# Patient Record
Sex: Male | Born: 1945 | Race: Black or African American | Hispanic: No | Marital: Single | Smoking: Never smoker
Health system: Southern US, Community
[De-identification: ages and names within clinical notes are randomized; demographics above are authoritative.]

## PROBLEM LIST (undated history)

## (undated) DIAGNOSIS — E870 Hyperosmolality and hypernatremia: Secondary | ICD-10-CM

## (undated) DIAGNOSIS — R5383 Other fatigue: Secondary | ICD-10-CM

## (undated) DIAGNOSIS — K449 Diaphragmatic hernia without obstruction or gangrene: Secondary | ICD-10-CM

## (undated) DIAGNOSIS — K219 Gastro-esophageal reflux disease without esophagitis: Secondary | ICD-10-CM

## (undated) DIAGNOSIS — I8390 Asymptomatic varicose veins of unspecified lower extremity: Secondary | ICD-10-CM

## (undated) DIAGNOSIS — N289 Disorder of kidney and ureter, unspecified: Secondary | ICD-10-CM

## (undated) DIAGNOSIS — M109 Gout, unspecified: Secondary | ICD-10-CM

## (undated) DIAGNOSIS — G609 Hereditary and idiopathic neuropathy, unspecified: Secondary | ICD-10-CM

## (undated) DIAGNOSIS — M1710 Unilateral primary osteoarthritis, unspecified knee: Secondary | ICD-10-CM

## (undated) DIAGNOSIS — N189 Chronic kidney disease, unspecified: Secondary | ICD-10-CM

## (undated) DIAGNOSIS — M1991 Primary osteoarthritis, unspecified site: Secondary | ICD-10-CM

## (undated) DIAGNOSIS — IMO0002 Reserved for concepts with insufficient information to code with codable children: Secondary | ICD-10-CM

## (undated) DIAGNOSIS — E119 Type 2 diabetes mellitus without complications: Secondary | ICD-10-CM

## (undated) DIAGNOSIS — D472 Monoclonal gammopathy: Secondary | ICD-10-CM

## (undated) DIAGNOSIS — E785 Hyperlipidemia, unspecified: Secondary | ICD-10-CM

## (undated) DIAGNOSIS — R5381 Other malaise: Secondary | ICD-10-CM

## (undated) DIAGNOSIS — I10 Essential (primary) hypertension: Secondary | ICD-10-CM

## (undated) DIAGNOSIS — G4452 New daily persistent headache (NDPH): Secondary | ICD-10-CM

## (undated) HISTORY — DX: New daily persistent headache (ndph): G44.52

## (undated) HISTORY — DX: Monoclonal gammopathy: D47.2

## (undated) HISTORY — DX: Gastro-esophageal reflux disease without esophagitis: K21.9

## (undated) HISTORY — DX: Diaphragmatic hernia without obstruction or gangrene: K44.9

## (undated) HISTORY — DX: Unilateral primary osteoarthritis, unspecified knee: M17.10

## (undated) HISTORY — DX: Hyperosmolality and hypernatremia: E87.0

## (undated) HISTORY — DX: Hereditary and idiopathic neuropathy, unspecified: G60.9

## (undated) HISTORY — DX: Type 2 diabetes mellitus without complications: E11.9

## (undated) HISTORY — DX: Gout, unspecified: M10.9

## (undated) HISTORY — DX: Other malaise: R53.83

## (undated) HISTORY — DX: Other malaise: R53.81

## (undated) HISTORY — DX: Reserved for concepts with insufficient information to code with codable children: IMO0002

## (undated) HISTORY — DX: Disorder of kidney and ureter, unspecified: N28.9

## (undated) HISTORY — DX: Asymptomatic varicose veins of unspecified lower extremity: I83.90

## (undated) HISTORY — DX: Primary osteoarthritis, unspecified site: M19.91

## (undated) HISTORY — DX: Hypercalcemia: E83.52

## (undated) HISTORY — DX: Hyperlipidemia, unspecified: E78.5

## (undated) HISTORY — DX: Chronic kidney disease, unspecified: N18.9

---

## 1998-03-19 ENCOUNTER — Other Ambulatory Visit: Admission: RE | Admit: 1998-03-19 | Discharge: 1998-03-19 | Payer: Self-pay | Admitting: *Deleted

## 1998-08-23 ENCOUNTER — Ambulatory Visit (HOSPITAL_COMMUNITY): Admission: RE | Admit: 1998-08-23 | Discharge: 1998-08-23 | Payer: Self-pay | Admitting: Nephrology

## 1998-08-23 ENCOUNTER — Encounter: Payer: Self-pay | Admitting: Nephrology

## 1999-03-22 ENCOUNTER — Encounter: Admission: RE | Admit: 1999-03-22 | Discharge: 1999-06-20 | Payer: Self-pay | Admitting: *Deleted

## 2001-11-24 ENCOUNTER — Ambulatory Visit (HOSPITAL_COMMUNITY): Admission: RE | Admit: 2001-11-24 | Discharge: 2001-11-24 | Payer: Self-pay | Admitting: Gastroenterology

## 2002-12-12 ENCOUNTER — Encounter: Admission: RE | Admit: 2002-12-12 | Discharge: 2002-12-12 | Payer: Self-pay | Admitting: Internal Medicine

## 2002-12-12 ENCOUNTER — Encounter: Payer: Self-pay | Admitting: Internal Medicine

## 2003-01-05 ENCOUNTER — Encounter (INDEPENDENT_AMBULATORY_CARE_PROVIDER_SITE_OTHER): Payer: Self-pay | Admitting: Specialist

## 2003-01-05 ENCOUNTER — Ambulatory Visit (HOSPITAL_COMMUNITY): Admission: RE | Admit: 2003-01-05 | Discharge: 2003-01-05 | Payer: Self-pay | Admitting: Gastroenterology

## 2004-08-22 ENCOUNTER — Encounter: Admission: RE | Admit: 2004-08-22 | Discharge: 2004-08-22 | Payer: Self-pay | Admitting: Internal Medicine

## 2004-09-19 ENCOUNTER — Encounter: Admission: RE | Admit: 2004-09-19 | Discharge: 2004-09-19 | Payer: Self-pay | Admitting: Internal Medicine

## 2004-09-27 ENCOUNTER — Encounter: Admission: RE | Admit: 2004-09-27 | Discharge: 2004-09-27 | Payer: Self-pay | Admitting: Internal Medicine

## 2004-10-24 ENCOUNTER — Encounter: Admission: RE | Admit: 2004-10-24 | Discharge: 2004-10-24 | Payer: Self-pay | Admitting: Internal Medicine

## 2005-03-06 ENCOUNTER — Encounter: Admission: RE | Admit: 2005-03-06 | Discharge: 2005-03-06 | Payer: Self-pay | Admitting: Interventional Radiology

## 2005-05-13 ENCOUNTER — Encounter: Admission: RE | Admit: 2005-05-13 | Discharge: 2005-05-13 | Payer: Self-pay | Admitting: Internal Medicine

## 2005-10-13 HISTORY — PX: CARPAL TUNNEL RELEASE: SHX101

## 2005-10-22 ENCOUNTER — Ambulatory Visit (HOSPITAL_BASED_OUTPATIENT_CLINIC_OR_DEPARTMENT_OTHER): Admission: RE | Admit: 2005-10-22 | Discharge: 2005-10-22 | Payer: Self-pay | Admitting: Orthopedic Surgery

## 2006-01-01 ENCOUNTER — Ambulatory Visit (HOSPITAL_BASED_OUTPATIENT_CLINIC_OR_DEPARTMENT_OTHER): Admission: RE | Admit: 2006-01-01 | Discharge: 2006-01-01 | Payer: Self-pay | Admitting: Orthopedic Surgery

## 2009-08-14 ENCOUNTER — Ambulatory Visit: Payer: Self-pay | Admitting: Oncology

## 2009-08-14 ENCOUNTER — Encounter: Admission: RE | Admit: 2009-08-14 | Discharge: 2009-08-14 | Payer: Self-pay | Admitting: Nephrology

## 2009-08-20 LAB — CBC WITH DIFFERENTIAL/PLATELET
BASO%: 0.1 % (ref 0.0–2.0)
EOS%: 0.4 % (ref 0.0–7.0)
MCH: 26.6 pg — ABNORMAL LOW (ref 27.2–33.4)
MCHC: 32.9 g/dL (ref 32.0–36.0)
MCV: 81 fL (ref 79.3–98.0)
MONO%: 3.9 % (ref 0.0–14.0)
NEUT%: 57.3 % (ref 39.0–75.0)
RDW: 15.6 % — ABNORMAL HIGH (ref 11.0–14.6)
lymph#: 3.8 10*3/uL — ABNORMAL HIGH (ref 0.9–3.3)

## 2009-08-20 LAB — CHCC SMEAR

## 2009-08-22 LAB — CREATININE CLEARANCE, URINE, 24 HOUR
Creatinine Clearance: 69 mL/min — ABNORMAL LOW (ref 75–125)
Creatinine, 24H Ur: 1847 mg/d (ref 800–2000)
Creatinine, Urine: 96 mg/dL
Creatinine: 1.85 mg/dL — ABNORMAL HIGH (ref 0.40–1.50)
Urine Total Volume-CRCL: 1925 mL

## 2009-08-22 LAB — IMMUNOFIXATION ELECTROPHORESIS
IgA: 87 mg/dL (ref 68–378)
IgG (Immunoglobin G), Serum: 1100 mg/dL (ref 694–1618)
Total Protein, Serum Electrophoresis: 7.6 g/dL (ref 6.0–8.3)

## 2009-08-22 LAB — COMPREHENSIVE METABOLIC PANEL
AST: 12 U/L (ref 0–37)
Albumin: 4.7 g/dL (ref 3.5–5.2)
Alkaline Phosphatase: 67 U/L (ref 39–117)
BUN: 38 mg/dL — ABNORMAL HIGH (ref 6–23)
Potassium: 3.7 mEq/L (ref 3.5–5.3)
Total Bilirubin: 0.3 mg/dL (ref 0.3–1.2)

## 2009-12-03 ENCOUNTER — Ambulatory Visit: Payer: Self-pay | Admitting: Oncology

## 2009-12-05 LAB — CBC WITH DIFFERENTIAL/PLATELET
BASO%: 0.5 % (ref 0.0–2.0)
Basophils Absolute: 0 10*3/uL (ref 0.0–0.1)
EOS%: 0.8 % (ref 0.0–7.0)
HGB: 12.2 g/dL — ABNORMAL LOW (ref 13.0–17.1)
MCH: 27.8 pg (ref 27.2–33.4)
MCHC: 32.9 g/dL (ref 32.0–36.0)
MCV: 84.5 fL (ref 79.3–98.0)
MONO%: 3.9 % (ref 0.0–14.0)
RBC: 4.38 10*6/uL (ref 4.20–5.82)
RDW: 16.7 % — ABNORMAL HIGH (ref 11.0–14.6)
lymph#: 3.6 10*3/uL — ABNORMAL HIGH (ref 0.9–3.3)

## 2009-12-05 LAB — PROTEIN / CREATININE RATIO, URINE: Creatinine, Urine: 230.4 mg/dL

## 2009-12-07 LAB — COMPREHENSIVE METABOLIC PANEL
ALT: 13 U/L (ref 0–53)
AST: 15 U/L (ref 0–37)
Albumin: 4.4 g/dL (ref 3.5–5.2)
Alkaline Phosphatase: 64 U/L (ref 39–117)
BUN: 36 mg/dL — ABNORMAL HIGH (ref 6–23)
Potassium: 3.8 mEq/L (ref 3.5–5.3)

## 2009-12-07 LAB — IMMUNOFIXATION ELECTROPHORESIS
IgA: 78 mg/dL (ref 68–378)
IgG (Immunoglobin G), Serum: 1110 mg/dL (ref 694–1618)
IgM, Serum: 91 mg/dL (ref 60–263)

## 2009-12-07 LAB — KAPPA/LAMBDA LIGHT CHAINS
Kappa:Lambda Ratio: 0.81 (ref 0.26–1.65)
Lambda Free Lght Chn: 1.15 mg/dL (ref 0.57–2.63)

## 2009-12-11 LAB — UIFE/LIGHT CHAINS/TP QN, 24-HR UR
Albumin, U: DETECTED
Alpha 1, Urine: DETECTED — AB
Alpha 2, Urine: DETECTED — AB
Free Kappa/Lambda Ratio: 15.22 ratio — ABNORMAL HIGH (ref 0.46–4.00)
Free Lambda Excretion/Day: 1.4 mg/d
Time: 24 hours
Total Protein, Urine: 2 mg/dL

## 2009-12-11 LAB — CREATININE CLEARANCE, URINE, 24 HOUR
Creatinine: 1.87 mg/dL — ABNORMAL HIGH (ref 0.40–1.50)
Urine Total Volume-CRCL: 1550 mL

## 2010-04-10 ENCOUNTER — Ambulatory Visit: Payer: Self-pay | Admitting: Oncology

## 2010-04-12 LAB — CBC WITH DIFFERENTIAL/PLATELET
Basophils Absolute: 0 10*3/uL (ref 0.0–0.1)
EOS%: 1.4 % (ref 0.0–7.0)
Eosinophils Absolute: 0.1 10*3/uL (ref 0.0–0.5)
HCT: 36.9 % — ABNORMAL LOW (ref 38.4–49.9)
HGB: 12.1 g/dL — ABNORMAL LOW (ref 13.0–17.1)
MCH: 26.1 pg — ABNORMAL LOW (ref 27.2–33.4)
MCV: 79.5 fL (ref 79.3–98.0)
NEUT#: 4.9 10*3/uL (ref 1.5–6.5)
NEUT%: 58.2 % (ref 39.0–75.0)
RDW: 15.8 % — ABNORMAL HIGH (ref 11.0–14.6)
lymph#: 2.9 10*3/uL (ref 0.9–3.3)

## 2010-04-12 LAB — COMPREHENSIVE METABOLIC PANEL
Albumin: 4.1 g/dL (ref 3.5–5.2)
BUN: 26 mg/dL — ABNORMAL HIGH (ref 6–23)
CO2: 24 mEq/L (ref 19–32)
Calcium: 9.5 mg/dL (ref 8.4–10.5)
Glucose, Bld: 233 mg/dL — ABNORMAL HIGH (ref 70–99)
Potassium: 3.5 mEq/L (ref 3.5–5.3)
Sodium: 137 mEq/L (ref 135–145)
Total Protein: 7 g/dL (ref 6.0–8.3)

## 2010-04-12 LAB — IGG, IGA, IGM
IgA: 82 mg/dL (ref 68–378)
IgG (Immunoglobin G), Serum: 1060 mg/dL (ref 694–1618)
IgM, Serum: 109 mg/dL (ref 60–263)

## 2010-08-09 ENCOUNTER — Ambulatory Visit: Payer: Self-pay | Admitting: Oncology

## 2010-08-13 LAB — CBC WITH DIFFERENTIAL/PLATELET
Basophils Absolute: 0 10*3/uL (ref 0.0–0.1)
EOS%: 1.1 % (ref 0.0–7.0)
HCT: 40.7 % (ref 38.4–49.9)
HGB: 12.9 g/dL — ABNORMAL LOW (ref 13.0–17.1)
LYMPH%: 36.9 % (ref 14.0–49.0)
MCH: 24.9 pg — ABNORMAL LOW (ref 27.2–33.4)
MCV: 78.1 fL — ABNORMAL LOW (ref 79.3–98.0)
MONO%: 4.1 % (ref 0.0–14.0)
NEUT%: 57.6 % (ref 39.0–75.0)
Platelets: 236 10*3/uL (ref 140–400)
lymph#: 4.8 10*3/uL — ABNORMAL HIGH (ref 0.9–3.3)

## 2010-08-14 LAB — IGG, IGA, IGM
IgA: 93 mg/dL (ref 68–378)
IgM, Serum: 120 mg/dL (ref 60–263)

## 2010-08-14 LAB — COMPREHENSIVE METABOLIC PANEL
AST: 17 U/L (ref 0–37)
BUN: 37 mg/dL — ABNORMAL HIGH (ref 6–23)
Calcium: 9.8 mg/dL (ref 8.4–10.5)
Chloride: 100 mEq/L (ref 96–112)
Creatinine, Ser: 1.66 mg/dL — ABNORMAL HIGH (ref 0.40–1.50)

## 2010-08-14 LAB — KAPPA/LAMBDA LIGHT CHAINS: Kappa:Lambda Ratio: 1.61 (ref 0.26–1.65)

## 2011-02-17 ENCOUNTER — Encounter (HOSPITAL_BASED_OUTPATIENT_CLINIC_OR_DEPARTMENT_OTHER): Payer: Self-pay | Admitting: Oncology

## 2011-02-17 ENCOUNTER — Other Ambulatory Visit (HOSPITAL_COMMUNITY): Payer: Self-pay | Admitting: Oncology

## 2011-02-17 DIAGNOSIS — D472 Monoclonal gammopathy: Secondary | ICD-10-CM

## 2011-02-17 LAB — CBC WITH DIFFERENTIAL/PLATELET
BASO%: 0.1 % (ref 0.0–2.0)
EOS%: 1.1 % (ref 0.0–7.0)
HCT: 35.6 % — ABNORMAL LOW (ref 38.4–49.9)
LYMPH%: 35.9 % (ref 14.0–49.0)
MCH: 26.5 pg — ABNORMAL LOW (ref 27.2–33.4)
MCHC: 33.1 g/dL (ref 32.0–36.0)
MONO%: 5.5 % (ref 0.0–14.0)
NEUT%: 57.4 % (ref 39.0–75.0)
Platelets: 222 10*3/uL (ref 140–400)
lymph#: 4.3 10*3/uL — ABNORMAL HIGH (ref 0.9–3.3)

## 2011-02-18 LAB — COMPREHENSIVE METABOLIC PANEL
ALT: 25 U/L (ref 0–53)
AST: 24 U/L (ref 0–37)
Alkaline Phosphatase: 91 U/L (ref 39–117)
Creatinine, Ser: 1.53 mg/dL — ABNORMAL HIGH (ref 0.40–1.50)
Total Bilirubin: 0.5 mg/dL (ref 0.3–1.2)

## 2011-02-18 LAB — KAPPA/LAMBDA LIGHT CHAINS
Kappa free light chain: 0.76 mg/dL (ref 0.33–1.94)
Kappa:Lambda Ratio: 0.54 (ref 0.26–1.65)

## 2011-02-18 LAB — IGG, IGA, IGM: IgM, Serum: 113 mg/dL (ref 60–263)

## 2011-02-28 NOTE — Op Note (Signed)
NAMEGEROGE, Adrian NO.:  [id number]   MEDICAL RECORD NO.:  [id number]          PATIENT TYPE:  AMB   LOCATION:  DSC                          FACILITY:  [facility]   PHYSICIAN:  Cindee Salt, M.D.       DATE OF BIRTH:  03-29-1946   DATE OF PROCEDURE:  01/01/2006  DATE OF DISCHARGE:                                 OPERATIVE REPORT   PREOPERATIVE DIAGNOSIS:  Carpal tunnel syndrome left hand.   POSTOPERATIVE DIAGNOSIS:  Carpal tunnel syndrome left hand.   OPERATION:  Decompression left median nerve.   SURGEON:  Cindee Salt, M.D.   ASSISTANT:  Carolyne Fiscal R.N.   ANESTHESIA:  General.   HISTORY:  The patient is a 65 year old male with a history of carpal tunnel  syndrome bilaterally. He has undergone release on the right side. He is  admitted, now, for release on the left; positive nerve conduction, he is not  responsive to conservative treatment.   DESCRIPTION OF PROCEDURE:  The patient is brought to the operating room  where a general anesthetic was carried out without difficulty; was prepped  using DuraPrep in the supine position, left arm free. A longitudinal  incision was made in the palm and carried down through subcutaneous tissue.  Bleeders were electrocauterized. The palmar fascia was split. The  superficial palmar arch identified. The flexor tendon to the ring and little  finger identified to the ulnar side of median nerve. The carpal retinaculum  was incised with sharp dissection, a right angle and Sewall retractor were  placed between skin and forearm fascia.   The fascia was released for approximately 1.5 cm proximal to the wrist  crease under direct vision. The canal was explored; the area of compression  was identified. No further lesions were identified. The wound was irrigated.  The skin was closed with interrupted 5-0 nylon sutures. A sterile  compressive dressing and splint was applied. The patient tolerated the  procedure well and was taken to the  recovery room for observation in  satisfactory condition. He is discharged home to return to the [facility]  of [place] in 1 week on Vicodin.           ______________________________  Cindee Salt, M.D.     GK/MEDQ  D:  01/01/2006  T:  01/02/2006  Job:  [id number]

## 2011-02-28 NOTE — Procedures (Signed)
[facility]  Patient:    Adrian Matthews, Adrian Matthews Visit Number: [id number] MRN: [id number]          Service Type: Attending:  Verlin Grills, M.D. Dictated by:   Verlin Grills, M.D. Proc. Date: 11/24/01   CC:         Wayne C. Dorna Bloom, M.D.   Procedure Report  PROCEDURE:  Colonoscopy.  REFERRING PHYSICIAN:  Wayne C. Dorna Bloom, M.D.  PROCEDURE INDICATION:  Adrian Matthews is a 65 year old male born Feb 16, 1946. Adrian Matthews intermittently passes fresh blood with otherwise normal bowel movements. His brother recently underwent a colonoscopy and colon polyps were removed. He denies a family history of colon cancer. Adrian Matthews flexible proctosigmoidoscopy performed October 28, 1999 was normal.  On November 17, 2001, Adrian Matthews hemoglobin was 12.6 grams, white blood cell count was 9100, and platelet count normal. On September 18, 2000 his serum ferritin was normal but his serum iron saturation was low at 13%.  Adrian Matthews viewed my colonoscopy education film in my office and I discussed with him the complications associated with colonoscopy and polypectomy including a 15 per 1000 risk of bleeding and four per 1000 risk of colon perforation requiring surgical repair. Adrian Matthews has signed the operative permit.  MEDICATION ALLERGIES:  None.  CHRONIC MEDICATIONS:  Avapro, Glucophage, Maxzide.  PAST MEDICAL HISTORY:  Type 2 diabetes mellitus, hypertension.  PAST SURGICAL HISTORY:  None.  HABITS:  Adrian Matthews does not smoke cigarettes and does not consume alcohol.  FAMILY HISTORY:  Negative for colon cancer. Brother recently underwent colonoscopy to remove colon polyps.  ENDOSCOPIST:  Verlin Grills, M.D.  PREMEDICATION:  Versed 10 mg, Demerol 100 mg.  ENDOSCOPE:  Olympus Pediatric Colonoscope.  DESCRIPTION OF PROCEDURE:  After obtaining informed consent, Adrian Matthews was placed in the left lateral decubitus position. I administered  intravenous Demerol and intravenous Versed to achieve conscious sedation for the procedure. The patients blood pressure, oxygen saturation and cardiac rhythm were monitored throughout the procedure and documented in the medical records. Anal inspection is normal. Digital rectal exam is normal. I did not examine the prostate. The Olympus Pediatric video colonoscope was introduced into the rectum and easily advanced to the cecum. A normal appearing ileocecal valve was intubated and the distal ileum inspected. Colonic preparation for the exam today was excellent.  Rectum:  Normal. Large but nonbleeding internal hemorrhoids are present.  Sigmoid colon and descending colon:  Normal.  Splenic flexure:  Normal.  Transverse colon:  Normal.  Hepatic flexure:  Normal.  Ascending colon:  Normal.  Cecum and ileocecal valve:  Normal.  Distal ileum:  Normal.  ASSESSMENT:  Normal proctocolonoscopy to the cecum with intubation of a normal appearing ileocecal valve and distal ileal inspection. Large but nonbleeding internal hemorrhoids are present. Endoscopically, there is no evidence for the presence of colorectal neoplasia. Dictated by:   Verlin Grills, M.D. Attending:  Verlin Grills, M.D. DD:  11/24/01 TD:  11/24/01 Job: [id number]

## 2011-05-07 ENCOUNTER — Encounter (HOSPITAL_COMMUNITY): Payer: Self-pay

## 2011-05-07 ENCOUNTER — Emergency Department (HOSPITAL_COMMUNITY)

## 2011-05-07 ENCOUNTER — Emergency Department (HOSPITAL_COMMUNITY)
Admission: EM | Admit: 2011-05-07 | Discharge: 2011-05-07 | Disposition: A | Discharge status: Home / Self Care | Attending: Emergency Medicine | Admitting: Emergency Medicine

## 2011-05-07 DIAGNOSIS — I10 Essential (primary) hypertension: Secondary | ICD-10-CM | POA: Insufficient documentation

## 2011-05-07 DIAGNOSIS — E119 Type 2 diabetes mellitus without complications: Secondary | ICD-10-CM | POA: Insufficient documentation

## 2011-05-07 DIAGNOSIS — R51 Headache: Secondary | ICD-10-CM | POA: Insufficient documentation

## 2011-05-07 DIAGNOSIS — Z79899 Other long term (current) drug therapy: Secondary | ICD-10-CM | POA: Insufficient documentation

## 2011-05-07 DIAGNOSIS — Z794 Long term (current) use of insulin: Secondary | ICD-10-CM | POA: Insufficient documentation

## 2011-05-07 HISTORY — DX: Essential (primary) hypertension: I10

## 2012-01-22 DIAGNOSIS — E785 Hyperlipidemia, unspecified: Secondary | ICD-10-CM | POA: Diagnosis not present

## 2012-01-22 DIAGNOSIS — E1129 Type 2 diabetes mellitus with other diabetic kidney complication: Secondary | ICD-10-CM | POA: Diagnosis not present

## 2012-01-26 DIAGNOSIS — E1129 Type 2 diabetes mellitus with other diabetic kidney complication: Secondary | ICD-10-CM | POA: Diagnosis not present

## 2012-01-26 DIAGNOSIS — N183 Chronic kidney disease, stage 3 unspecified: Secondary | ICD-10-CM | POA: Diagnosis not present

## 2012-01-26 DIAGNOSIS — I129 Hypertensive chronic kidney disease with stage 1 through stage 4 chronic kidney disease, or unspecified chronic kidney disease: Secondary | ICD-10-CM | POA: Diagnosis not present

## 2012-02-06 DIAGNOSIS — E1129 Type 2 diabetes mellitus with other diabetic kidney complication: Secondary | ICD-10-CM | POA: Diagnosis not present

## 2012-02-06 DIAGNOSIS — N183 Chronic kidney disease, stage 3 unspecified: Secondary | ICD-10-CM | POA: Diagnosis not present

## 2012-02-06 DIAGNOSIS — E785 Hyperlipidemia, unspecified: Secondary | ICD-10-CM | POA: Diagnosis not present

## 2012-02-06 DIAGNOSIS — I10 Essential (primary) hypertension: Secondary | ICD-10-CM | POA: Diagnosis not present

## 2012-04-05 DIAGNOSIS — I10 Essential (primary) hypertension: Secondary | ICD-10-CM | POA: Diagnosis not present

## 2012-04-05 DIAGNOSIS — E1129 Type 2 diabetes mellitus with other diabetic kidney complication: Secondary | ICD-10-CM | POA: Diagnosis not present

## 2012-04-05 DIAGNOSIS — IMO0002 Reserved for concepts with insufficient information to code with codable children: Secondary | ICD-10-CM | POA: Diagnosis not present

## 2012-04-05 DIAGNOSIS — M1710 Unilateral primary osteoarthritis, unspecified knee: Secondary | ICD-10-CM | POA: Diagnosis not present

## 2012-04-06 ENCOUNTER — Ambulatory Visit
Admission: RE | Admit: 2012-04-06 | Discharge: 2012-04-06 | Disposition: A | Discharge status: Home / Self Care | Payer: Medicare Other | Source: Ambulatory Visit | Attending: Internal Medicine | Admitting: Internal Medicine

## 2012-04-06 ENCOUNTER — Other Ambulatory Visit: Payer: Self-pay | Admitting: Internal Medicine

## 2012-04-06 DIAGNOSIS — M25569 Pain in unspecified knee: Secondary | ICD-10-CM | POA: Diagnosis not present

## 2012-04-06 DIAGNOSIS — M1990 Unspecified osteoarthritis, unspecified site: Secondary | ICD-10-CM

## 2012-04-14 DIAGNOSIS — IMO0002 Reserved for concepts with insufficient information to code with codable children: Secondary | ICD-10-CM | POA: Diagnosis not present

## 2012-04-22 DIAGNOSIS — IMO0002 Reserved for concepts with insufficient information to code with codable children: Secondary | ICD-10-CM | POA: Diagnosis not present

## 2012-04-26 DIAGNOSIS — N2581 Secondary hyperparathyroidism of renal origin: Secondary | ICD-10-CM | POA: Diagnosis not present

## 2012-04-26 DIAGNOSIS — E876 Hypokalemia: Secondary | ICD-10-CM | POA: Diagnosis not present

## 2012-04-26 DIAGNOSIS — N183 Chronic kidney disease, stage 3 unspecified: Secondary | ICD-10-CM | POA: Diagnosis not present

## 2012-05-03 DIAGNOSIS — IMO0002 Reserved for concepts with insufficient information to code with codable children: Secondary | ICD-10-CM | POA: Diagnosis not present

## 2012-05-05 DIAGNOSIS — R5383 Other fatigue: Secondary | ICD-10-CM | POA: Diagnosis not present

## 2012-05-05 DIAGNOSIS — N182 Chronic kidney disease, stage 2 (mild): Secondary | ICD-10-CM | POA: Diagnosis not present

## 2012-05-05 DIAGNOSIS — N2581 Secondary hyperparathyroidism of renal origin: Secondary | ICD-10-CM | POA: Diagnosis not present

## 2012-05-05 DIAGNOSIS — I129 Hypertensive chronic kidney disease with stage 1 through stage 4 chronic kidney disease, or unspecified chronic kidney disease: Secondary | ICD-10-CM | POA: Diagnosis not present

## 2012-05-05 DIAGNOSIS — R5381 Other malaise: Secondary | ICD-10-CM | POA: Diagnosis not present

## 2012-05-12 DIAGNOSIS — M659 Synovitis and tenosynovitis, unspecified: Secondary | ICD-10-CM | POA: Diagnosis not present

## 2012-05-12 DIAGNOSIS — Y929 Unspecified place or not applicable: Secondary | ICD-10-CM | POA: Diagnosis not present

## 2012-05-12 DIAGNOSIS — M2240 Chondromalacia patellae, unspecified knee: Secondary | ICD-10-CM | POA: Diagnosis not present

## 2012-05-12 DIAGNOSIS — X58XXXA Exposure to other specified factors, initial encounter: Secondary | ICD-10-CM | POA: Diagnosis not present

## 2012-05-12 DIAGNOSIS — IMO0002 Reserved for concepts with insufficient information to code with codable children: Secondary | ICD-10-CM | POA: Diagnosis not present

## 2012-05-12 DIAGNOSIS — M1710 Unilateral primary osteoarthritis, unspecified knee: Secondary | ICD-10-CM | POA: Diagnosis not present

## 2012-05-12 DIAGNOSIS — Y939 Activity, unspecified: Secondary | ICD-10-CM | POA: Diagnosis not present

## 2012-05-12 DIAGNOSIS — Y999 Unspecified external cause status: Secondary | ICD-10-CM | POA: Diagnosis not present

## 2012-05-12 DIAGNOSIS — M23305 Other meniscus derangements, unspecified medial meniscus, unspecified knee: Secondary | ICD-10-CM | POA: Diagnosis not present

## 2012-05-24 DIAGNOSIS — E785 Hyperlipidemia, unspecified: Secondary | ICD-10-CM | POA: Diagnosis not present

## 2012-05-24 DIAGNOSIS — IMO0001 Reserved for inherently not codable concepts without codable children: Secondary | ICD-10-CM | POA: Diagnosis not present

## 2012-05-26 DIAGNOSIS — E1139 Type 2 diabetes mellitus with other diabetic ophthalmic complication: Secondary | ICD-10-CM | POA: Diagnosis not present

## 2012-05-31 DIAGNOSIS — E1129 Type 2 diabetes mellitus with other diabetic kidney complication: Secondary | ICD-10-CM | POA: Diagnosis not present

## 2012-05-31 DIAGNOSIS — N183 Chronic kidney disease, stage 3 unspecified: Secondary | ICD-10-CM | POA: Diagnosis not present

## 2012-05-31 DIAGNOSIS — I129 Hypertensive chronic kidney disease with stage 1 through stage 4 chronic kidney disease, or unspecified chronic kidney disease: Secondary | ICD-10-CM | POA: Diagnosis not present

## 2012-05-31 DIAGNOSIS — E785 Hyperlipidemia, unspecified: Secondary | ICD-10-CM | POA: Diagnosis not present

## 2012-08-09 DIAGNOSIS — IMO0002 Reserved for concepts with insufficient information to code with codable children: Secondary | ICD-10-CM | POA: Diagnosis not present

## 2012-08-09 DIAGNOSIS — Z23 Encounter for immunization: Secondary | ICD-10-CM | POA: Diagnosis not present

## 2012-08-09 DIAGNOSIS — I129 Hypertensive chronic kidney disease with stage 1 through stage 4 chronic kidney disease, or unspecified chronic kidney disease: Secondary | ICD-10-CM | POA: Diagnosis not present

## 2012-08-09 DIAGNOSIS — M1710 Unilateral primary osteoarthritis, unspecified knee: Secondary | ICD-10-CM | POA: Diagnosis not present

## 2012-08-09 DIAGNOSIS — IMO0001 Reserved for inherently not codable concepts without codable children: Secondary | ICD-10-CM | POA: Diagnosis not present

## 2012-10-29 DIAGNOSIS — R5381 Other malaise: Secondary | ICD-10-CM | POA: Diagnosis not present

## 2012-10-29 DIAGNOSIS — N182 Chronic kidney disease, stage 2 (mild): Secondary | ICD-10-CM | POA: Diagnosis not present

## 2012-10-29 DIAGNOSIS — N2581 Secondary hyperparathyroidism of renal origin: Secondary | ICD-10-CM | POA: Diagnosis not present

## 2012-10-29 DIAGNOSIS — IMO0001 Reserved for inherently not codable concepts without codable children: Secondary | ICD-10-CM | POA: Diagnosis not present

## 2012-11-01 DIAGNOSIS — E785 Hyperlipidemia, unspecified: Secondary | ICD-10-CM | POA: Diagnosis not present

## 2012-11-01 DIAGNOSIS — I129 Hypertensive chronic kidney disease with stage 1 through stage 4 chronic kidney disease, or unspecified chronic kidney disease: Secondary | ICD-10-CM | POA: Diagnosis not present

## 2012-11-01 DIAGNOSIS — N183 Chronic kidney disease, stage 3 unspecified: Secondary | ICD-10-CM | POA: Diagnosis not present

## 2012-11-01 DIAGNOSIS — IMO0001 Reserved for inherently not codable concepts without codable children: Secondary | ICD-10-CM | POA: Diagnosis not present

## 2012-11-17 DIAGNOSIS — I129 Hypertensive chronic kidney disease with stage 1 through stage 4 chronic kidney disease, or unspecified chronic kidney disease: Secondary | ICD-10-CM | POA: Diagnosis not present

## 2012-11-17 DIAGNOSIS — D631 Anemia in chronic kidney disease: Secondary | ICD-10-CM | POA: Diagnosis not present

## 2012-11-17 DIAGNOSIS — N182 Chronic kidney disease, stage 2 (mild): Secondary | ICD-10-CM | POA: Diagnosis not present

## 2012-11-17 DIAGNOSIS — N2581 Secondary hyperparathyroidism of renal origin: Secondary | ICD-10-CM | POA: Diagnosis not present

## 2012-12-10 DIAGNOSIS — E1129 Type 2 diabetes mellitus with other diabetic kidney complication: Secondary | ICD-10-CM | POA: Diagnosis not present

## 2012-12-10 DIAGNOSIS — E785 Hyperlipidemia, unspecified: Secondary | ICD-10-CM | POA: Diagnosis not present

## 2012-12-10 DIAGNOSIS — N183 Chronic kidney disease, stage 3 unspecified: Secondary | ICD-10-CM | POA: Diagnosis not present

## 2012-12-10 DIAGNOSIS — I10 Essential (primary) hypertension: Secondary | ICD-10-CM | POA: Diagnosis not present

## 2013-02-24 ENCOUNTER — Encounter: Payer: Self-pay | Admitting: *Deleted

## 2013-02-24 ENCOUNTER — Other Ambulatory Visit: Payer: Medicare Other

## 2013-02-24 DIAGNOSIS — IMO0001 Reserved for inherently not codable concepts without codable children: Secondary | ICD-10-CM

## 2013-02-24 DIAGNOSIS — I10 Essential (primary) hypertension: Secondary | ICD-10-CM

## 2013-02-25 ENCOUNTER — Encounter: Payer: Self-pay | Admitting: Geriatric Medicine

## 2013-02-25 LAB — COMPREHENSIVE METABOLIC PANEL
ALT: 14 IU/L (ref 0–44)
AST: 17 IU/L (ref 0–40)
Albumin/Globulin Ratio: 1.3 (ref 1.1–2.5)
Albumin: 3.8 g/dL (ref 3.6–4.8)
Alkaline Phosphatase: 78 IU/L (ref 39–117)
BUN/Creatinine Ratio: 15 (ref 10–22)
BUN: 21 mg/dL (ref 8–27)
CO2: 21 mmol/L (ref 19–28)
Calcium: 9.8 mg/dL (ref 8.6–10.2)
Chloride: 101 mmol/L (ref 97–108)
Creatinine, Ser: 1.44 mg/dL — ABNORMAL HIGH (ref 0.76–1.27)
GFR calc Af Amer: 58 mL/min/{1.73_m2} — ABNORMAL LOW (ref >59)
GFR calc non Af Amer: 50 mL/min/{1.73_m2} — ABNORMAL LOW (ref >59)
Globulin, Total: 2.9 g/dL (ref 1.5–4.5)
Glucose: 91 mg/dL (ref 65–99)
Potassium: 3.5 mmol/L (ref 3.5–5.2)
Sodium: 140 mmol/L (ref 134–144)
Total Bilirubin: 0.2 mg/dL (ref 0.0–1.2)
Total Protein: 6.7 g/dL (ref 6.0–8.5)

## 2013-02-25 LAB — LIPID PANEL
Chol/HDL Ratio: 3 ratio units (ref 0.0–5.0)
Cholesterol, Total: 133 mg/dL (ref 100–199)
HDL: 44 mg/dL (ref >39)
LDL Calculated: 69 mg/dL (ref 0–99)
Triglycerides: 99 mg/dL (ref 0–149)
VLDL Cholesterol Cal: 20 mg/dL (ref 5–40)

## 2013-02-25 LAB — HEMOGLOBIN A1C
Est. average glucose Bld gHb Est-mCnc: 160 mg/dL
Hgb A1c MFr Bld: 7.2 % — ABNORMAL HIGH (ref 4.8–5.6)

## 2013-02-28 ENCOUNTER — Ambulatory Visit (INDEPENDENT_AMBULATORY_CARE_PROVIDER_SITE_OTHER): Payer: Medicare Other | Admitting: Pharmacotherapy

## 2013-02-28 ENCOUNTER — Other Ambulatory Visit: Payer: Self-pay | Admitting: *Deleted

## 2013-02-28 ENCOUNTER — Encounter: Payer: Self-pay | Admitting: Pharmacotherapy

## 2013-02-28 VITALS — BP 142/84 | HR 82 | Temp 98.1°F | Resp 14 | Wt 314.2 lb

## 2013-02-28 DIAGNOSIS — Z794 Long term (current) use of insulin: Secondary | ICD-10-CM | POA: Insufficient documentation

## 2013-02-28 DIAGNOSIS — E785 Hyperlipidemia, unspecified: Secondary | ICD-10-CM | POA: Diagnosis not present

## 2013-02-28 DIAGNOSIS — E1169 Type 2 diabetes mellitus with other specified complication: Secondary | ICD-10-CM | POA: Insufficient documentation

## 2013-02-28 DIAGNOSIS — IMO0001 Reserved for inherently not codable concepts without codable children: Secondary | ICD-10-CM | POA: Diagnosis not present

## 2013-02-28 DIAGNOSIS — N289 Disorder of kidney and ureter, unspecified: Secondary | ICD-10-CM | POA: Diagnosis not present

## 2013-02-28 DIAGNOSIS — E1122 Type 2 diabetes mellitus with diabetic chronic kidney disease: Secondary | ICD-10-CM | POA: Insufficient documentation

## 2013-02-28 DIAGNOSIS — I10 Essential (primary) hypertension: Secondary | ICD-10-CM

## 2013-02-28 MED ORDER — INSULIN LISPRO 100 UNIT/ML (KWIKPEN): PEN_INJECTOR | SUBCUTANEOUS | Status: DC

## 2013-02-28 NOTE — Patient Instructions (Signed)
Focus on healthy eating and exercise

## 2013-02-28 NOTE — Progress Notes (Signed)
  Subjective:    Adrian Matthews is a 67 y.o. male who presents for follow-up of Type 2 diabetes mellitus.   A1C:  7.2% Currently on Lantus 55 units daily and Humalog 22 units with each meal. Forgot to bring blood glucose meter or logbook. He reports BG are "fairly good unless I act ugly". BG usually <200.  Says highest 170-180 range.  Says lowest has been 60. He says his average BG is 150 mg/dl Hypoglycemia is rare.  Usually tries to make healthy food choices.  Sometimes skips breakfast. Exercises three times per week.  He is limited due to leg pain. Some tingling in feet.  No sores on feet. Denies problems with eyes.  Due for an exam in August 2014. Nocturia 2 x per night.  No change.  He did have blood work done on Thursday.  Review of Systems  A comprehensive ROS was done and was negative except for the following: GU:  Nocturia Endo:  Tingling in feet  Objective:    BP 142/84  Pulse 82  Temp(Src) 98.1 F (36.7 C) (Oral)  Resp 14  Wt 314 lb 3.2 oz (142.52 kg)  General:  alert, cooperative, no distress, moderately obese and appears younger than stated age  Oropharynx: normal findings: lips normal without lesions   Eyes:  negative findings: lids and lashes normal, conjunctivae and sclerae normal and corneas clear   Ears:  external ears normal        Lung: clear to auscultation bilaterally  Heart:  regular rate and rhythm     Extremities: no edema  Skin: warm and dry     Neuro: mental status, speech normal, alert and oriented x3 and gait and station normal   Lab Review Glucose (mg/dL)  Date Value  02/24/2013 91      Glucose, Bld (mg/dL)  Date Value  02/17/2011 175*  08/13/2010 214*  04/12/2010 233*     CO2 (mmol/L)  Date Value  02/24/2013 21   02/17/2011 25   08/13/2010 24      BUN (mg/dL)  Date Value  02/24/2013 21   02/17/2011 25*  08/13/2010 37*  04/12/2010 26*     Creatinine (mg/dL)  Date Value  12/07/2009 1.87*  08/22/2009 1.85*     Creatinine, Ser  (mg/dL)  Date Value  02/24/2013 1.44*  02/17/2011 1.53*  08/13/2010 1.66*    02/24/13: A1C:  7.2% AST:  17 ALT:  14 Total cholesterol:  133 Triglycerides:  99 HDL:  44 LDL:  69  Assessment:    Diabetes Mellitus type II, under good control.  BP goal <140/80 LDL goal <100   Plan:    1.  Rx changes: none 2.  Continue Lantus 55 units daily and Humalog 22 units with meals.  A1C up a little due to not as strict with lifestyle modification.  3.  Counseled on nutrition goals. 4.  Counseled on exercise goals.  Goal 30-45 minutes 5 x week 5.  Counseled on foot care. 6.  BP - HTN essentially at goal <140/80 on current RX. 7.  LDL at goal <100.

## 2013-04-08 ENCOUNTER — Other Ambulatory Visit: Payer: Self-pay | Admitting: *Deleted

## 2013-04-08 ENCOUNTER — Encounter: Payer: Self-pay | Admitting: Internal Medicine

## 2013-04-08 ENCOUNTER — Ambulatory Visit (INDEPENDENT_AMBULATORY_CARE_PROVIDER_SITE_OTHER): Payer: Medicare Other | Admitting: Internal Medicine

## 2013-04-08 VITALS — BP 128/72 | HR 96 | Temp 98.1°F | Resp 18 | Ht 71.0 in | Wt 313.0 lb

## 2013-04-08 DIAGNOSIS — I10 Essential (primary) hypertension: Secondary | ICD-10-CM | POA: Diagnosis not present

## 2013-04-08 DIAGNOSIS — D472 Monoclonal gammopathy: Secondary | ICD-10-CM | POA: Insufficient documentation

## 2013-04-08 DIAGNOSIS — E785 Hyperlipidemia, unspecified: Secondary | ICD-10-CM | POA: Diagnosis not present

## 2013-04-08 DIAGNOSIS — IMO0001 Reserved for inherently not codable concepts without codable children: Secondary | ICD-10-CM | POA: Diagnosis not present

## 2013-04-08 DIAGNOSIS — M1710 Unilateral primary osteoarthritis, unspecified knee: Secondary | ICD-10-CM | POA: Insufficient documentation

## 2013-04-08 DIAGNOSIS — K219 Gastro-esophageal reflux disease without esophagitis: Secondary | ICD-10-CM

## 2013-04-08 DIAGNOSIS — IMO0002 Reserved for concepts with insufficient information to code with codable children: Secondary | ICD-10-CM | POA: Insufficient documentation

## 2013-04-08 DIAGNOSIS — M109 Gout, unspecified: Secondary | ICD-10-CM | POA: Insufficient documentation

## 2013-04-08 MED ORDER — CHLORTHALIDONE 25 MG PO TABS: ORAL_TABLET | ORAL | Status: DC

## 2013-04-08 MED ORDER — INSULIN GLARGINE 100 UNIT/ML ~~LOC~~ SOLN: SUBCUTANEOUS | Status: DC

## 2013-04-08 MED ORDER — HYDROCODONE-ACETAMINOPHEN 5-325 MG PO TABS: ORAL_TABLET | ORAL | Status: DC

## 2013-04-08 MED ORDER — ESOMEPRAZOLE MAGNESIUM 40 MG PO CPDR: 40.0000 mg | DELAYED_RELEASE_CAPSULE | Freq: Every day | ORAL | Status: DC

## 2013-04-08 MED ORDER — AMLODIPINE BESYLATE 10 MG PO TABS: ORAL_TABLET | ORAL | Status: DC

## 2013-04-08 NOTE — Patient Instructions (Signed)
Try using a roller to stretch out your iliotibial band on your right thigh after exercise.  Also do stretches like Cathey recommended.

## 2013-04-08 NOTE — Assessment & Plan Note (Signed)
Bilateral knees.  Is stable.  Interferes with his ability to run like he used to.  Does use elliptical.

## 2013-04-08 NOTE — Assessment & Plan Note (Signed)
At goal.  No changes necessary.  On ARB, statin, chlorthalidone, norvasc and baby asa

## 2013-04-08 NOTE — Assessment & Plan Note (Signed)
Has been stable longstanding.  I am following spep/upep annually.

## 2013-04-08 NOTE — Assessment & Plan Note (Signed)
Notes some chronic pain in feet and ankles.  Unclear if this is truly gout.  He is not on any meds for gout.

## 2013-04-08 NOTE — Progress Notes (Signed)
Patient ID: Adrian Matthews, male   DOB: [birth year], 67 y.o.   MRN: [id number] Location:  [facility]  Code Status: full code   No Known Allergies  Chief Complaint  Patient presents with  . Medical Managment of Chronic Issues    no new problems    HPI: Patient is a 67 y.o. pleasant AA male seen in the office today for his routine medical mgt of chronic conditions.  [facility] would not fill his pantoprazole right now so needs change Can do elliptical for an hour--when gets down it gets tight and there's a thing along his right lateral thigh that is painful.  Also happens if stands for a long period of time.  Taking hydrocodone an hour or so before bed helps.  Wonders if it's related to his veins.   Left ankle swells a little bit when wears flip flops at home.  Knows he has flat feet. Is using a protein supplement shake in the am for diabetics (glucerna) in the morning b/c he does not feel like having breakfast.  Then eats lunch at noon.   Used to be much more active.  Weight is stable, but he knows he's inactive. Review of Systems:  Review of Systems  Constitutional: Negative for fever, chills and weight loss.  HENT: Negative for hearing loss.   Eyes: Negative for blurred vision.  Respiratory: Negative for shortness of breath.   Cardiovascular: Negative for chest pain.  Gastrointestinal: Positive for heartburn. Negative for constipation.  Genitourinary: Negative for dysuria.  Musculoskeletal: Positive for myalgias and joint pain.  Skin: Negative for rash.  Neurological: Negative for dizziness and weakness.  Endo/Heme/Allergies: Does not bruise/bleed easily.  Psychiatric/Behavioral: Negative for depression.     Past Medical History  Diagnosis Date  . Diabetes mellitus   . Hypertension   . Hyperlipidemia   . Osteoarthrosis, unspecified whether generalized or localized, lower leg   . Primary localized osteoarthrosis, unspecified site    . Asymptomatic varicose veins   . New daily persistent headache   . Unspecified hereditary and idiopathic peripheral neuropathy   . Chronic kidney disease   . Monoclonal paraproteinemia   . Gout, unspecified   . Hypercalcemia   . Hyperosmolality and/or hypernatremia   . Diaphragmatic hernia without mention of obstruction or gangrene   . Unspecified disorder of kidney and ureter   . Other malaise and fatigue   . GERD (gastroesophageal reflux disease)     Past Surgical History  Procedure Laterality Date  . Carpal tunnel release  2007    Social History:   reports that he has never smoked. He does not have any smokeless tobacco history on file. He reports that he does not drink alcohol or use illicit drugs.  Family History  Problem Relation Age of Onset  . Hypertension Sister   . Hypertension Brother   . Diabetes Brother   . Hypertension Brother   . Hypertension Sister     Medications: Patient's Medications  New Prescriptions   ESOMEPRAZOLE (NEXIUM) 40 MG CAPSULE    Take 1 capsule (40 mg total) by mouth daily.  Previous Medications   ASPIRIN 81 MG TABLET    Take 81 mg by mouth daily.   INSULIN LISPRO (HUMALOG KWIKPEN) 100 UNIT/ML SOLN    Inject 22 units before each meal plus sliding scale.   IRBESARTAN (AVAPRO) 300 MG TABLET    Take 300 mg by mouth at bedtime. Take one tablet once daily to control  blood pressure.   PANTOPRAZOLE (PROTONIX) 40 MG TABLET    Take 40 mg by mouth daily. Take one tablet once daily for acid reflux.   POTASSIUM CHLORIDE (K-DUR) 10 MEQ TABLET    Take 10 mEq by mouth 2 (two) times daily. Take 2 tablets by mouth daily.   ROSUVASTATIN (CRESTOR) 20 MG TABLET    Take 20 mg by mouth daily. Take one tablet once daily for cholesterol.  Modified Medications   Modified Medication Previous Medication   AMLODIPINE (NORVASC) 10 MG TABLET amLODipine (NORVASC) 10 MG tablet      Take one tablet once day for blood pressure    Take 10 mg by mouth daily. Take one  tablet once daily   CHLORTHALIDONE (HYGROTON) 25 MG TABLET chlorthalidone (HYGROTON) 25 MG tablet      Take one tablet by mouth once daily for blood pressure.    Take 25 mg by mouth daily. Take one tablet by mouth once daily for blood pressure.   HYDROCODONE-ACETAMINOPHEN (NORCO/VICODIN) 5-325 MG PER TABLET HYDROcodone-acetaminophen (NORCO/VICODIN) 5-325 MG per tablet      Take one to two tablets every 4 to 6 hours as needed for pain.    Take 1 tablet by mouth every 6 (six) hours as needed for pain. Take one to two tablets every 4 to 6 hours as needed for pain.   INSULIN GLARGINE (LANTUS) 100 UNIT/ML INJECTION insulin glargine (LANTUS) 100 UNIT/ML injection      Inject 55 units once day for diabetes Dx. 250.00    Inject into the skin at bedtime. Inject 55 units once daily.  Discontinued Medications   No medications on file     Physical Exam: Filed Vitals:   04/08/13 0832  BP: 128/72  Pulse: 96  Temp: 98.1 F (36.7 C)  TempSrc: Oral  Resp: 18  Height: 5\' 11"  (1.803 m)  Weight: 313 lb (141.976 kg)  SpO2: 98%   Physical Exam  Constitutional: He is oriented to person, place, and time. He appears well-developed and well-nourished. No distress.  obese  HENT:  Head: Normocephalic and atraumatic.  Cardiovascular: Normal rate, regular rhythm, normal heart sounds and intact distal pulses.  Exam reveals no gallop and no friction rub.   No murmur heard. Pulmonary/Chest: Effort normal and breath sounds normal. No respiratory distress.  Abdominal: Soft. Bowel sounds are normal.  Musculoskeletal: Normal range of motion. He exhibits edema.  Mild tenderness over iliotibial band on right thigh;  Mild edema of b/l LE, nonpitting  Neurological: He is alert and oriented to person, place, and time.  Skin: Skin is warm and dry.  Psychiatric: He has a normal mood and affect. His behavior is normal. Judgment and thought content normal.   Labs reviewed: Basic Metabolic Panel:  Recent Labs   02/24/13 0913  NA 140  K 3.5  CL 101  CO2 21  GLUCOSE 91  BUN 21  CREATININE 1.44*  CALCIUM 9.8   Liver Function Tests:  Recent Labs  02/24/13 0913  AST 17  ALT 14  ALKPHOS 78  BILITOT 0.2  PROT 6.7  Lipid Panel:  Recent Labs  02/24/13 0913  HDL 44  LDLCALC 69  TRIG 99  CHOLHDL 3.0   Lab Results  Component Value Date   HGBA1C 7.2* 02/24/2013   Assessment/Plan Type II or unspecified type diabetes mellitus without mention of complication, uncontrolled Following with Cathey.  Stable.  Is trying to lose some weight--plans to add light weights to regimen.    Hyperlipidemia  LDL goal < 100 Stable.  On crestor.  Last lipids at goal.  Unspecified essential hypertension At goal.  No changes necessary.  On ARB, statin, chlorthalidone, norvasc and baby asa  Osteoarthrosis, unspecified whether generalized or localized, lower leg Bilateral knees.  Is stable.  Interferes with his ability to run like he used to.  Does use elliptical.  Monoclonal paraproteinemia Has been stable longstanding.  I am following spep/upep annually.  Gout, unspecified Notes some chronic pain in feet and ankles.  Unclear if this is truly gout.  He is not on any meds for gout.   Labs/tests ordered:  Has labs before visit with Cathey  Next appt: 4 mos with me

## 2013-04-08 NOTE — Assessment & Plan Note (Signed)
Stable.  On crestor.  Last lipids at goal.

## 2013-04-08 NOTE — Assessment & Plan Note (Signed)
Following with Cathey.  Stable.  Is trying to lose some weight--plans to add light weights to regimen.

## 2013-04-11 ENCOUNTER — Ambulatory Visit: Payer: Self-pay | Admitting: Internal Medicine

## 2013-05-26 DIAGNOSIS — E119 Type 2 diabetes mellitus without complications: Secondary | ICD-10-CM | POA: Diagnosis not present

## 2013-06-04 ENCOUNTER — Encounter (HOSPITAL_COMMUNITY): Payer: Self-pay | Admitting: *Deleted

## 2013-06-04 ENCOUNTER — Observation Stay (HOSPITAL_COMMUNITY)
Admission: EM | Admit: 2013-06-04 | Discharge: 2013-06-06 | Disposition: A | Discharge status: Home / Self Care | Payer: Medicare Other | Attending: Internal Medicine | Admitting: Internal Medicine

## 2013-06-04 DIAGNOSIS — D649 Anemia, unspecified: Secondary | ICD-10-CM | POA: Diagnosis not present

## 2013-06-04 DIAGNOSIS — IMO0002 Reserved for concepts with insufficient information to code with codable children: Secondary | ICD-10-CM | POA: Diagnosis not present

## 2013-06-04 DIAGNOSIS — Z794 Long term (current) use of insulin: Secondary | ICD-10-CM | POA: Insufficient documentation

## 2013-06-04 DIAGNOSIS — K648 Other hemorrhoids: Secondary | ICD-10-CM | POA: Insufficient documentation

## 2013-06-04 DIAGNOSIS — K921 Melena: Principal | ICD-10-CM | POA: Insufficient documentation

## 2013-06-04 DIAGNOSIS — I129 Hypertensive chronic kidney disease with stage 1 through stage 4 chronic kidney disease, or unspecified chronic kidney disease: Secondary | ICD-10-CM | POA: Diagnosis not present

## 2013-06-04 DIAGNOSIS — E785 Hyperlipidemia, unspecified: Secondary | ICD-10-CM | POA: Diagnosis not present

## 2013-06-04 DIAGNOSIS — E669 Obesity, unspecified: Secondary | ICD-10-CM | POA: Diagnosis not present

## 2013-06-04 DIAGNOSIS — Z79899 Other long term (current) drug therapy: Secondary | ICD-10-CM | POA: Diagnosis not present

## 2013-06-04 DIAGNOSIS — IMO0001 Reserved for inherently not codable concepts without codable children: Secondary | ICD-10-CM | POA: Diagnosis not present

## 2013-06-04 DIAGNOSIS — N182 Chronic kidney disease, stage 2 (mild): Secondary | ICD-10-CM | POA: Insufficient documentation

## 2013-06-04 DIAGNOSIS — K5730 Diverticulosis of large intestine without perforation or abscess without bleeding: Secondary | ICD-10-CM | POA: Insufficient documentation

## 2013-06-04 DIAGNOSIS — K625 Hemorrhage of anus and rectum: Secondary | ICD-10-CM | POA: Diagnosis not present

## 2013-06-04 DIAGNOSIS — K922 Gastrointestinal hemorrhage, unspecified: Secondary | ICD-10-CM | POA: Diagnosis not present

## 2013-06-04 DIAGNOSIS — M1710 Unilateral primary osteoarthritis, unspecified knee: Secondary | ICD-10-CM | POA: Diagnosis present

## 2013-06-04 DIAGNOSIS — D126 Benign neoplasm of colon, unspecified: Secondary | ICD-10-CM | POA: Insufficient documentation

## 2013-06-04 DIAGNOSIS — E1122 Type 2 diabetes mellitus with diabetic chronic kidney disease: Secondary | ICD-10-CM | POA: Diagnosis present

## 2013-06-04 DIAGNOSIS — I10 Essential (primary) hypertension: Secondary | ICD-10-CM | POA: Diagnosis present

## 2013-06-04 DIAGNOSIS — D472 Monoclonal gammopathy: Secondary | ICD-10-CM | POA: Insufficient documentation

## 2013-06-04 DIAGNOSIS — E1169 Type 2 diabetes mellitus with other specified complication: Secondary | ICD-10-CM | POA: Diagnosis present

## 2013-06-04 LAB — CBC
HCT: 29.5 % — ABNORMAL LOW (ref 39.0–52.0)
MCV: 79.3 fL (ref 78.0–100.0)
Platelets: 208 10*3/uL (ref 150–400)
RBC: 3.72 MIL/uL — ABNORMAL LOW (ref 4.22–5.81)
RDW: 17.5 % — ABNORMAL HIGH (ref 11.5–15.5)
WBC: 9.3 10*3/uL (ref 4.0–10.5)

## 2013-06-04 LAB — CBC WITH DIFFERENTIAL/PLATELET
Eosinophils Absolute: 0.2 10*3/uL (ref 0.0–0.7)
Eosinophils Relative: 2 % (ref 0–5)
HCT: 27.8 % — ABNORMAL LOW (ref 39.0–52.0)
Hemoglobin: 9.2 g/dL — ABNORMAL LOW (ref 13.0–17.0)
Lymphs Abs: 3.4 10*3/uL (ref 0.7–4.0)
MCH: 26.4 pg (ref 26.0–34.0)
MCV: 79.7 fL (ref 78.0–100.0)
Monocytes Absolute: 0.8 10*3/uL (ref 0.1–1.0)
Monocytes Relative: 8 % (ref 3–12)
Platelets: 211 10*3/uL (ref 150–400)
RBC: 3.49 MIL/uL — ABNORMAL LOW (ref 4.22–5.81)

## 2013-06-04 LAB — TYPE AND SCREEN
ABO/RH(D): B POS
Antibody Screen: NEGATIVE

## 2013-06-04 LAB — COMPREHENSIVE METABOLIC PANEL
BUN: 18 mg/dL (ref 6–23)
Calcium: 10.1 mg/dL (ref 8.4–10.5)
Creatinine, Ser: 1.37 mg/dL — ABNORMAL HIGH (ref 0.50–1.35)
GFR calc Af Amer: 61 mL/min — ABNORMAL LOW (ref >90)
GFR calc non Af Amer: 52 mL/min — ABNORMAL LOW (ref >90)
Glucose, Bld: 93 mg/dL (ref 70–99)
Total Protein: 7.2 g/dL (ref 6.0–8.3)

## 2013-06-04 LAB — GLUCOSE, CAPILLARY
Glucose-Capillary: 93 mg/dL (ref 70–99)
Glucose-Capillary: 131 mg/dL — ABNORMAL HIGH (ref 70–99)

## 2013-06-04 LAB — PROTIME-INR: Prothrombin Time: 13.4 seconds (ref 11.6–15.2)

## 2013-06-04 MED ORDER — ACETAMINOPHEN 650 MG RE SUPP: 650.0000 mg | Freq: Four times a day (QID) | RECTAL | Status: DC | PRN

## 2013-06-04 MED ORDER — SODIUM CHLORIDE 0.9 % IJ SOLN
3.0000 mL | Freq: Two times a day (BID) | INTRAMUSCULAR | Status: DC
  Administered 2013-06-05 (×2): 3 mL via INTRAVENOUS

## 2013-06-04 MED ORDER — INSULIN ASPART 100 UNIT/ML ~~LOC~~ SOLN
10.0000 [IU] | Freq: Three times a day (TID) | SUBCUTANEOUS | Status: DC
  Administered 2013-06-06: 10 [IU] via SUBCUTANEOUS

## 2013-06-04 MED ORDER — ATORVASTATIN CALCIUM 10 MG PO TABS
10.0000 mg | ORAL_TABLET | Freq: Every day | ORAL | Status: DC
  Administered 2013-06-05: 10 mg via ORAL
  Filled 2013-06-04 (×2): qty 1

## 2013-06-04 MED ORDER — IRBESARTAN 300 MG PO TABS
300.0000 mg | ORAL_TABLET | Freq: Every day | ORAL | Status: DC
  Administered 2013-06-05: 300 mg via ORAL
  Filled 2013-06-04 (×2): qty 1

## 2013-06-04 MED ORDER — INSULIN GLARGINE 100 UNIT/ML ~~LOC~~ SOLN
55.0000 [IU] | Freq: Every day | SUBCUTANEOUS | Status: DC
  Administered 2013-06-04: 55 [IU] via SUBCUTANEOUS
  Filled 2013-06-04 (×2): qty 0.55

## 2013-06-04 MED ORDER — PANTOPRAZOLE SODIUM 40 MG PO TBEC
40.0000 mg | DELAYED_RELEASE_TABLET | Freq: Every day | ORAL | Status: DC
  Administered 2013-06-05 – 2013-06-06 (×2): 40 mg via ORAL
  Filled 2013-06-04: qty 1

## 2013-06-04 MED ORDER — AMLODIPINE BESYLATE 10 MG PO TABS
10.0000 mg | ORAL_TABLET | Freq: Every day | ORAL | Status: DC
  Administered 2013-06-05 – 2013-06-06 (×2): 10 mg via ORAL
  Filled 2013-06-04 (×3): qty 1

## 2013-06-04 MED ORDER — CHLORTHALIDONE 25 MG PO TABS
25.0000 mg | ORAL_TABLET | Freq: Every day | ORAL | Status: DC
  Administered 2013-06-06: 25 mg via ORAL
  Filled 2013-06-04 (×2): qty 1

## 2013-06-04 MED ORDER — INSULIN LISPRO 100 UNIT/ML (KWIKPEN): 10.0000 [IU] | PEN_INJECTOR | Freq: Three times a day (TID) | SUBCUTANEOUS | Status: DC

## 2013-06-04 MED ORDER — SODIUM CHLORIDE 0.9 % IV SOLN
INTRAVENOUS | Status: AC
  Administered 2013-06-04: 18:00:00 via INTRAVENOUS

## 2013-06-04 MED ORDER — INSULIN ASPART 100 UNIT/ML ~~LOC~~ SOLN
0.0000 [IU] | Freq: Three times a day (TID) | SUBCUTANEOUS | Status: DC
  Administered 2013-06-05 – 2013-06-06 (×2): 3 [IU] via SUBCUTANEOUS

## 2013-06-04 MED ORDER — ACETAMINOPHEN 325 MG PO TABS: 650.0000 mg | ORAL_TABLET | Freq: Four times a day (QID) | ORAL | Status: DC | PRN

## 2013-06-04 NOTE — ED Provider Notes (Addendum)
CSN: [id number]     Arrival date & time 06/04/13  1229 History     First MD Initiated Contact with Patient 06/04/13 1240     Chief Complaint  Patient presents with  . Rectal Bleeding   (Consider location/radiation/quality/duration/timing/severity/associated sxs/prior Treatment) HPI Comments: This to the emergency department for evaluation of rectal bleeding. Patient reports that he has had 2 episodes of bright red blood per rectum since this morning. He denies pain. Patient reports that it was blood mixed with stool, no clots. He does report, however, that after he has the bowel movement, blood continues to come out. He denies chest pain, shortness of breath, palpitations or passing out. Patient does report that he has a history of similar rectal bleeding approximately a year ago. He says that resolved on its own he never had it checked out.  Patient is a 67 y.o. male presenting with hematochezia.  Rectal Bleeding Associated symptoms: no abdominal pain and no fever     Past Medical History  Diagnosis Date  . Diabetes mellitus   . Hypertension   . Hyperlipidemia   . Osteoarthrosis, unspecified whether generalized or localized, lower leg   . Primary localized osteoarthrosis, unspecified site   . Asymptomatic varicose veins   . New daily persistent headache   . Unspecified hereditary and idiopathic peripheral neuropathy   . Chronic kidney disease   . Monoclonal paraproteinemia   . Gout, unspecified   . Hypercalcemia   . Hyperosmolality and/or hypernatremia   . Diaphragmatic hernia without mention of obstruction or gangrene   . Unspecified disorder of kidney and ureter   . Other malaise and fatigue   . GERD (gastroesophageal reflux disease)    Past Surgical History  Procedure Laterality Date  . Carpal tunnel release  2007   Family History  Problem Relation Age of Onset  . Hypertension Sister   . Hypertension Brother   . Diabetes Brother   . Hypertension Brother   .  Hypertension Sister    History  Substance Use Topics  . Smoking status: Never Smoker   . Smokeless tobacco: Not on file  . Alcohol Use: No    Review of Systems  Constitutional: Negative for fever.  Respiratory: Negative.   Cardiovascular: Negative.   Gastrointestinal: Positive for blood in stool, hematochezia and anal bleeding. Negative for abdominal pain.  All other systems reviewed and are negative.    Allergies  Review of patient's allergies indicates no known allergies.  Home Medications   Current Outpatient Rx  Name  Route  Sig  Dispense  Refill  . amLODipine (NORVASC) 10 MG tablet      Take one tablet once day for blood pressure   90 tablet   3   . aspirin 81 MG tablet   Oral   Take 81 mg by mouth daily.         . chlorthalidone (HYGROTON) 25 MG tablet      Take one tablet by mouth once daily for blood pressure.   90 tablet   3   . esomeprazole (NEXIUM) 40 MG capsule   Oral   Take 1 capsule (40 mg total) by mouth daily.   90 capsule   3   . HYDROcodone-acetaminophen (NORCO/VICODIN) 5-325 MG per tablet      Take one to two tablets every 4 to 6 hours as needed for pain.   360 tablet   1   . insulin glargine (LANTUS) 100 UNIT/ML injection  Inject 55 units once day for diabetes Dx. 250.00   100 mL   3   . insulin lispro (HUMALOG KWIKPEN) 100 unit/mL SOLN      Inject 22 units before each meal plus sliding scale.   15 mL   5   . irbesartan (AVAPRO) 300 MG tablet   Oral   Take 300 mg by mouth at bedtime. Take one tablet once daily to control blood pressure.         . pantoprazole (PROTONIX) 40 MG tablet   Oral   Take 40 mg by mouth daily. Take one tablet once daily for acid reflux.         . potassium chloride (K-DUR) 10 MEQ tablet   Oral   Take 10 mEq by mouth 2 (two) times daily. Take 2 tablets by mouth daily.         . rosuvastatin (CRESTOR) 20 MG tablet   Oral   Take 20 mg by mouth daily. Take one tablet once daily for  cholesterol.          BP 156/66  Pulse 98  Temp(Src) 98.3 F (36.8 C) (Oral)  Resp 22  SpO2 97% Physical Exam  Constitutional: He is oriented to person, place, and time. He appears well-developed and well-nourished. No distress.  HENT:  Head: Normocephalic and atraumatic.  Right Ear: Hearing normal.  Left Ear: Hearing normal.  Nose: Nose normal.  Mouth/Throat: Oropharynx is clear and moist and mucous membranes are normal.  Eyes: Conjunctivae and EOM are normal. Pupils are equal, round, and reactive to light.  Neck: Normal range of motion. Neck supple.  Cardiovascular: Regular rhythm, S1 normal and S2 normal.  Exam reveals no gallop and no friction rub.   No murmur heard. Pulmonary/Chest: Effort normal and breath sounds normal. No respiratory distress. He exhibits no tenderness.  Abdominal: Soft. Normal appearance and bowel sounds are normal. There is no hepatosplenomegaly. There is no tenderness. There is no rebound, no guarding, no tenderness at McBurney's point and negative Murphy's sign. No hernia.  Musculoskeletal: Normal range of motion.  Neurological: He is alert and oriented to person, place, and time. He has normal strength. No cranial nerve deficit or sensory deficit. Coordination normal. GCS eye subscore is 4. GCS verbal subscore is 5. GCS motor subscore is 6.  Skin: Skin is warm, dry and intact. No rash noted. No cyanosis.  Psychiatric: He has a normal mood and affect. His speech is normal and behavior is normal. Thought content normal.    ED Course   Procedures (including critical care time)  Labs Reviewed  CBC WITH DIFFERENTIAL - Abnormal; Notable for the following:    RBC 3.49 (*)    Hemoglobin 9.2 (*)    HCT 27.8 (*)    RDW 17.4 (*)    All other components within normal limits  COMPREHENSIVE METABOLIC PANEL - Abnormal; Notable for the following:    Creatinine, Ser 1.37 (*)    Albumin 3.4 (*)    Total Bilirubin 0.1 (*)    GFR calc non Af Amer 52 (*)    GFR  calc Af Amer 61 (*)    All other components within normal limits  OCCULT BLOOD, POC DEVICE - Abnormal; Notable for the following:    Fecal Occult Bld POSITIVE (*)    All other components within normal limits  PROTIME-INR  TYPE AND SCREEN   No results found.  Diagnosis: 1. Rectal bleeding 2. Anemia   MDM  Patient presents to  the ER for evaluation of painless rectal bleeding. Patient has had 2 episodes of large-volume bright red blood per rectum this morning. His vital signs are stable. Abdominal exam is completely benign. Patient's hemoglobin is 9.2, down more than 2 g from his baseline. Because of this, patient will be presented to the hospitalist for admission and further workup.  D/W Dr. Bosie Clos, on call for GI - will see patient.  Gilda Crease, MD 06/04/13 1506  Gilda Crease, MD 06/04/13 [phone]

## 2013-06-04 NOTE — ED Notes (Signed)
Pt is here with rectal bleeding times 2 today and denies pain.  Pt states that he is having BM  And blood just drips out of rectum.  No chest pain or sob

## 2013-06-04 NOTE — Progress Notes (Signed)
06/04/13  nsg ED placed at 1528. report called [id number]. Arrival to floor 1719. To unit 6700 per stretcher accompanied by NT alert and oriented patient. No skin issues noted. Oriented to unit set up . Placed on telemetry per order. Placed patient on clears per order.

## 2013-06-04 NOTE — H&P (Signed)
[facility] History and Physical  Torris House ZOX:[id number] DOB: 04-28-1946 DOA: 06/04/2013  Referring physician: Jaci Carrel, MD PCP: Bufford Spikes, DO   Chief Complaint: Rectal bleeding since one day  HPI:  67 year old obese male with history of hypertension, diabetes mellitus on high-dose insulin, (hemoglobin A1c in May of 7.2) , CKD stage II, GERD, hyperlipidemia, osteoarthritis who was in his usual state of health when he noticed that your blood in the commode while he was having a bowel movement this morning. He then brought up and went for the shower and again had a large amount of bright a blood per rectum and thus came to the ED. Patient denies any headache, dizziness, lightheadedness, syncope, nausea, vomiting, fever, chills, chest pain, palpitations, shortness of breath, abdominal pain or cramps. Denies change in bowel symptoms. Denies any hematuria or urinary symptoms. Denies loss of weight or appetite. He reports having one incidence of similar symptoms earlier this year which subsided on its own. Denies any history of hemorrhoids. Denies use of NSAIDs, he reports having a colonoscopy about 6 years ago at [facility].  Course in the ED Patient's vitals were stable. He was noted to have a drop in his hemoglobin of 9.2 from 11.8 back in May. He was noted to have baseline CKD . Physical exam was otherwise unremarkable. [facility] called for admission under telemetry.  Review of Systems:  Constitutional: Denies fever, chills, diaphoresis, appetite change and fatigue.  HEENT: Denies photophobia, eye pain, redness, hearing loss, ear pain, congestion, sore throat, rhinorrhea, sneezing, mouth sores, trouble swallowing, neck pain, neck stiffness and tinnitus.   Respiratory: Denies SOB, DOE, cough, chest tightness,  and wheezing.   Cardiovascular: Denies chest pain, palpitations and leg swelling.  Gastrointestinal: bright red blood in stool, Denies nausea, vomiting,  abdominal pain, constipation, blood in stool and abdominal distention.  Genitourinary: Denies dysuria, urgency, frequency, hematuria, flank pain and difficulty urinating.  Endocrine: Denies: hot or cold intolerance, sweats, changes in hair or nails, polyuria, polydipsia. Musculoskeletal: Denies myalgias, back pain, joint swelling, arthralgias and gait problem.  Skin: Denies pallor, rash and wound.  Neurological: Denies dizziness, seizures, syncope, weakness, light-headedness, numbness and headaches.  Hematological: Denies adenopathy.    Past Medical History  Diagnosis Date  . Diabetes mellitus   . Hypertension   . Hyperlipidemia   . Osteoarthrosis, unspecified whether generalized or localized, lower leg   . Primary localized osteoarthrosis, unspecified site   . Asymptomatic varicose veins   . New daily persistent headache   . Unspecified hereditary and idiopathic peripheral neuropathy   . Chronic kidney disease   . Monoclonal paraproteinemia   . Gout, unspecified   . Hypercalcemia   . Hyperosmolality and/or hypernatremia   . Diaphragmatic hernia without mention of obstruction or gangrene   . Unspecified disorder of kidney and ureter   . Other malaise and fatigue   . GERD (gastroesophageal reflux disease)    Past Surgical History  Procedure Laterality Date  . Carpal tunnel release  2007   Social History:  reports that he has never smoked. He does not have any smokeless tobacco history on file. He reports that he does not drink alcohol or use illicit drugs.  No Known Allergies  Family History  Problem Relation Age of Onset  . Hypertension Sister   . Hypertension Brother   . Diabetes Brother   . Hypertension Brother   . Hypertension Sister     Prior to Admission medications   Medication Sig Start Date End  Date Taking? Authorizing Provider  amLODipine (NORVASC) 10 MG tablet Take 10 mg by mouth daily.   Yes Historical Provider, MD  aspirin 81 MG tablet Take 81 mg by mouth  daily.   Yes Historical Provider, MD  chlorthalidone (HYGROTON) 25 MG tablet Take one tablet by mouth once daily for blood pressure. 04/08/13  Yes Tiffany L Reed, DO  esomeprazole (NEXIUM) 40 MG capsule Take 1 capsule (40 mg total) by mouth daily. 04/08/13  Yes Tiffany L Reed, DO  insulin glargine (LANTUS) 100 UNIT/ML injection Inject 55 units once day for diabetes Dx. 250.00 04/08/13  Yes Tiffany L Reed, DO  insulin lispro (HUMALOG KWIKPEN) 100 unit/mL SOLN Inject 22 units before each meal plus sliding scale. 02/28/13  Yes Edison Pace, RPH-CPP  irbesartan (AVAPRO) 300 MG tablet Take 300 mg by mouth at bedtime. Take one tablet once daily to control blood pressure.   Yes Historical Provider, MD  pantoprazole (PROTONIX) 40 MG tablet Take 40 mg by mouth daily. Take one tablet once daily for acid reflux.   Yes Historical Provider, MD  potassium chloride (K-DUR) 10 MEQ tablet Take 10 mEq by mouth 2 (two) times daily. Take 2 tablets by mouth daily.   Yes Historical Provider, MD  rosuvastatin (CRESTOR) 20 MG tablet Take 20 mg by mouth daily. Take one tablet once daily for cholesterol.   Yes Historical Provider, MD    Physical Exam:  Filed Vitals:   06/04/13 1300 06/04/13 1330 06/04/13 1420 06/04/13 1500  BP: 119/52 117/65 112/53 131/63  Pulse: 83 80 84 91  Temp:   98.5 F (36.9 C)   TempSrc:   Oral   Resp:   20   SpO2: 100% 100% 100% 100%    Constitutional: Vital signs reviewed.  Obese male lying in bed in no distress HEENT: No pallor, muscle to mucosa: No icterus, no cervical lymphadenopathy  Cardiovascular: RRR, S1 normal, S2 normal, no MRG, pulses symmetric and intact bilaterally Pulmonary/Chest: CTAB, no wheezes, rales, or rhonchi Abdominal: Soft. Non-tender, non-distended, bowel sounds are normal, no masses, organomegaly, or guarding present. Rectal exam: external hemorrhoids at 6 O' clock position. No blood seen.  Musculoskeletal: No joint deformities, erythema, or stiffness, ROM full and  no nontender Ext: trace edema,  no cyanosis, pulses palpable bilaterally  Hematology: no cervical, inginal, or axillary adenopathy.  Neurological: A&O x3, non focal  Labs on Admission:  Basic Metabolic Panel:  Recent Labs Lab 06/04/13 1353  NA 138  K 3.8  CL 101  CO2 25  GLUCOSE 93  BUN 18  CREATININE 1.37*  CALCIUM 10.1   Liver Function Tests:  Recent Labs Lab 06/04/13 1353  AST 24  ALT 21  ALKPHOS 68  BILITOT 0.1*  PROT 7.2  ALBUMIN 3.4*   No results found for this basename: LIPASE, AMYLASE,  in the last 168 hours No results found for this basename: AMMONIA,  in the last 168 hours CBC:  Recent Labs Lab 06/04/13 1353  WBC 9.9  NEUTROABS 5.5  HGB 9.2*  HCT 27.8*  MCV 79.7  PLT 211   Cardiac Enzymes: No results found for this basename: CKTOTAL, CKMB, CKMBINDEX, TROPONINI,  in the last 168 hours BNP: No components found with this basename: POCBNP,  CBG: No results found for this basename: GLUCAP,  in the last 168 hours  Radiological Exams on Admission: No results found.    Assessment/Plan  Active Problems:   Rectal bleeding Admit under obs to telemetry Monitor serial H&h Q6-8 hrs Type  and screen. No need for transfusion at this time. Differential likely diverticular bleed vs hemorrhoidal bleed. [facility] consulted who will evaluate pt. Avoid NSAIDs. Will hod ASA. -clears for now.    Type II or unspecified type diabetes mellitus without mention of complication, uncontrolled A1C of 7.2 in may.will recheck in am. On high dose lantus and premeal insulin. Will resume home dose lantus. Reduce premeal insulin to 10 u tid.    Hyperlipidemia  Place on lipitor    Unspecified essential hypertension Resume home meds amlodipine, chlorthalidone and avapro  CKD staqe 2 At baseline. Monitor    Osteoarthrosis Prn tylenol for pain     Obesity Needs counsel on diet and exercise to lose weight  DVT prophylaxis: SCD   Code Status: full code Family  Communication: none at bedside Disposition Plan:home once stable  Jakaiden Fill [facility] Pager [phone]  If 7PM-7AM, please contact night-coverage [email or web address] Password [facility] 06/04/2013, 4:25 PM   Total time spent: 55 minutes

## 2013-06-05 DIAGNOSIS — K922 Gastrointestinal hemorrhage, unspecified: Secondary | ICD-10-CM

## 2013-06-05 DIAGNOSIS — IMO0001 Reserved for inherently not codable concepts without codable children: Secondary | ICD-10-CM | POA: Diagnosis not present

## 2013-06-05 DIAGNOSIS — K625 Hemorrhage of anus and rectum: Secondary | ICD-10-CM | POA: Diagnosis not present

## 2013-06-05 DIAGNOSIS — E785 Hyperlipidemia, unspecified: Secondary | ICD-10-CM | POA: Diagnosis not present

## 2013-06-05 DIAGNOSIS — N182 Chronic kidney disease, stage 2 (mild): Secondary | ICD-10-CM | POA: Diagnosis not present

## 2013-06-05 LAB — CBC
HCT: 27 % — ABNORMAL LOW (ref 39.0–52.0)
Hemoglobin: 8.7 g/dL — ABNORMAL LOW (ref 13.0–17.0)
MCH: 25.9 pg — ABNORMAL LOW (ref 26.0–34.0)
MCH: 25.9 pg — ABNORMAL LOW (ref 26.0–34.0)
MCHC: 32.2 g/dL (ref 30.0–36.0)
MCHC: 32.6 g/dL (ref 30.0–36.0)
MCV: 80 fL (ref 78.0–100.0)
Platelets: 199 10*3/uL (ref 150–400)
RBC: 3.36 MIL/uL — ABNORMAL LOW (ref 4.22–5.81)
RDW: 17.4 % — ABNORMAL HIGH (ref 11.5–15.5)
RDW: 17.5 % — ABNORMAL HIGH (ref 11.5–15.5)

## 2013-06-05 LAB — GLUCOSE, CAPILLARY: Glucose-Capillary: 108 mg/dL — ABNORMAL HIGH (ref 70–99)

## 2013-06-05 LAB — BASIC METABOLIC PANEL
Calcium: 9.4 mg/dL (ref 8.4–10.5)
Creatinine, Ser: 1.29 mg/dL (ref 0.50–1.35)
GFR calc Af Amer: 65 mL/min — ABNORMAL LOW (ref >90)
GFR calc non Af Amer: 56 mL/min — ABNORMAL LOW (ref >90)

## 2013-06-05 MED ORDER — INSULIN GLARGINE 100 UNIT/ML ~~LOC~~ SOLN: 55.0000 [IU] | Freq: Every day | SUBCUTANEOUS | Status: DC

## 2013-06-05 MED ORDER — INSULIN GLARGINE 100 UNIT/ML ~~LOC~~ SOLN
25.0000 [IU] | Freq: Every day | SUBCUTANEOUS | Status: DC
  Administered 2013-06-05: 25 [IU] via SUBCUTANEOUS
  Filled 2013-06-05 (×2): qty 0.25

## 2013-06-05 MED ORDER — SODIUM CHLORIDE 0.9 % IV SOLN
INTRAVENOUS | Status: DC
  Administered 2013-06-06: 06:00:00 via INTRAVENOUS

## 2013-06-05 MED ORDER — PEG 3350-KCL-NA BICARB-NACL 420 G PO SOLR
4000.0000 mL | Freq: Once | ORAL | Status: AC
  Administered 2013-06-05: 4000 mL via ORAL
  Filled 2013-06-05: qty 4000

## 2013-06-05 NOTE — Progress Notes (Signed)
[facility] PROGRESS NOTE  Adrian Matthews WJX:[id number] DOB: May 02, 1946 DOA: 06/04/2013 PCP: Bufford Spikes, DO  Assessment/Plan: Rectal bleeding  -Serial H/H overall stable  -Type and screen. No need for transfusion at this time.  -Differential likely diverticular bleed vs hemorrhoidal bleed.  -[facility] consulted who will evaluate pt.  -Avoid NSAIDs. Will hod ASA.  -clears for now. Type II or unspecified type diabetes mellitus without mention of complication, uncontrolled  -A1C of 7.2 in may, repeat of 6.3.  -On high dose lantus and premeal insulin.  -Cont home dose lantus. Reduced premeal insulin to 10 u tid. Hyperlipidemia  Place on lipitor  Unspecified essential hypertension  -BP stable -Resume home meds amlodipine, chlorthalidone and avapro  CKD staqe 2  -At baseline. Monitor  Osteoarthrosis  -Prn tylenol for pain  Obesity  -Needs counsel on diet and exercise to lose weight  DVT prophylaxis:  -SCD  Code Status: Full Family Communication: Pt in room (indicate person spoken with, relationship, and if by phone, the number) Disposition Plan: Pending  Consultants: - GI  HPI/Subjective: No complaints. Eager to go home  Objective: Filed Vitals:   06/04/13 1740 06/04/13 2155 06/05/13 0505 06/05/13 1000  BP: 121/69 129/62 121/66 145/85  Pulse: 79 83 84 78  Temp: 98 F (36.7 C) 98.6 F (37 C) 98.4 F (36.9 C) 98.6 F (37 C)  TempSrc:  Oral Oral Oral  Resp: 19 20 20 20   Height: 5\' 11"  (1.803 m)     Weight: 142.9 kg (315 lb 0.6 oz)     SpO2: 97% 100% 98% 98%    Intake/Output Summary (Last 24 hours) at 06/05/13 1005 Last data filed at 06/05/13 0900  Gross per 24 hour  Intake 2039.33 ml  Output      0 ml  Net 2039.33 ml   Filed Weights   06/04/13 1740  Weight: 142.9 kg (315 lb 0.6 oz)    Exam:   General:  Awake, in nad  Cardiovascular: regular, s1, s2  Respiratory: normal resp effort, no wheezing  Abdomen: soft,  nondistended  Musculoskeletal: perfused, no clubbing   Data Reviewed: Basic Metabolic Panel:  Recent Labs Lab 06/04/13 1353 06/05/13 0400  NA 138 140  K 3.8 3.4*  CL 101 103  CO2 25 29  GLUCOSE 93 112*  BUN 18 14  CREATININE 1.37* 1.29  CALCIUM 10.1 9.4   Liver Function Tests:  Recent Labs Lab 06/04/13 1353  AST 24  ALT 21  ALKPHOS 68  BILITOT 0.1*  PROT 7.2  ALBUMIN 3.4*   No results found for this basename: LIPASE, AMYLASE,  in the last 168 hours No results found for this basename: AMMONIA,  in the last 168 hours CBC:  Recent Labs Lab 06/04/13 1353 06/04/13 1942 06/05/13 0400  WBC 9.9 9.3 8.2  NEUTROABS 5.5  --   --   HGB 9.2* 9.7* 8.7*  HCT 27.8* 29.5* 27.0*  MCV 79.7 79.3 80.4  PLT 211 208 202   Cardiac Enzymes: No results found for this basename: CKTOTAL, CKMB, CKMBINDEX, TROPONINI,  in the last 168 hours BNP (last 3 results) No results found for this basename: PROBNP,  in the last 8760 hours CBG:  Recent Labs Lab 06/04/13 1736 06/04/13 2148 06/05/13 0728  GLUCAP 93 131* 108*    No results found for this or any previous visit (from the past 240 hour(s)).   Studies: No results found.  Scheduled Meds: . amLODipine  10 mg Oral Daily  . atorvastatin  10  mg Oral q1800  . chlorthalidone  25 mg Oral Daily  . insulin aspart  0-15 Units Subcutaneous TID WC  . insulin aspart  10 Units Subcutaneous TID WC  . insulin glargine  55 Units Subcutaneous QHS  . irbesartan  300 mg Oral QHS  . pantoprazole  40 mg Oral Daily  . sodium chloride  3 mL Intravenous Q12H   Continuous Infusions:   Active Problems:   Type II or unspecified type diabetes mellitus without mention of complication, uncontrolled   Hyperlipidemia LDL goal < 100   Unspecified essential hypertension   Osteoarthrosis, unspecified whether generalized or localized, lower leg   Monoclonal paraproteinemia   Obesity   Rectal bleeding   Chronic kidney disease (CKD), stage II  (mild)    Time spent:    Nijah Tejera K  [facility] Pager [phone]. If 7PM-7AM, please contact night-coverage at [email or web address], password [facility] 06/05/2013, 10:05 AM  LOS: 1 day

## 2013-06-05 NOTE — Consult Note (Signed)
Referring Provider: Dr. Gonzella Lex Primary Care Physician:  Bufford Spikes, DO Primary Gastroenterologist:  Gentry Fitz  Reason for Consultation:  Rectal bleeding  HPI: Adrian Matthews is a 67 y.o. male who had the acute onset of rectal bleeding described as 2 episodes of BRBPR with stool that happened within several hours of each other. Denies associated rectal pain or abdominal pain. Denies N/V. Denies dizziness. No BMs or bleeding since admit. Reports normal colonoscopy 6 years ago by [facility] but no record of his procedure in our electronic medical record. On daily ASA 81 mg/day and denies other NSAIDs.  Past Medical History  Diagnosis Date  . Diabetes mellitus   . Hypertension   . Hyperlipidemia   . Osteoarthrosis, unspecified whether generalized or localized, lower leg   . Primary localized osteoarthrosis, unspecified site   . Asymptomatic varicose veins   . New daily persistent headache   . Unspecified hereditary and idiopathic peripheral neuropathy   . Chronic kidney disease   . Monoclonal paraproteinemia   . Gout, unspecified   . Hypercalcemia   . Hyperosmolality and/or hypernatremia   . Diaphragmatic hernia without mention of obstruction or gangrene   . Unspecified disorder of kidney and ureter   . Other malaise and fatigue   . GERD (gastroesophageal reflux disease)     Past Surgical History  Procedure Laterality Date  . Carpal tunnel release  2007    Prior to Admission medications   Medication Sig Start Date End Date Taking? Authorizing Provider  amLODipine (NORVASC) 10 MG tablet Take 10 mg by mouth daily.   Yes Historical Provider, MD  aspirin 81 MG tablet Take 81 mg by mouth daily.   Yes Historical Provider, MD  chlorthalidone (HYGROTON) 25 MG tablet Take one tablet by mouth once daily for blood pressure. 04/08/13  Yes Tiffany L Reed, DO  esomeprazole (NEXIUM) 40 MG capsule Take 1 capsule (40 mg total) by mouth daily. 04/08/13  Yes Tiffany L Reed, DO  insulin glargine  (LANTUS) 100 UNIT/ML injection Inject 55 units once day for diabetes Dx. 250.00 04/08/13  Yes Tiffany L Reed, DO  insulin lispro (HUMALOG KWIKPEN) 100 unit/mL SOLN Inject 22 units before each meal plus sliding scale. 02/28/13  Yes Edison Pace, RPH-CPP  irbesartan (AVAPRO) 300 MG tablet Take 300 mg by mouth at bedtime. Take one tablet once daily to control blood pressure.   Yes Historical Provider, MD  pantoprazole (PROTONIX) 40 MG tablet Take 40 mg by mouth daily. Take one tablet once daily for acid reflux.   Yes Historical Provider, MD  potassium chloride (K-DUR) 10 MEQ tablet Take 10 mEq by mouth 2 (two) times daily. Take 2 tablets by mouth daily.   Yes Historical Provider, MD  rosuvastatin (CRESTOR) 20 MG tablet Take 20 mg by mouth daily. Take one tablet once daily for cholesterol.   Yes Historical Provider, MD    Scheduled Meds: . amLODipine  10 mg Oral Daily  . atorvastatin  10 mg Oral q1800  . chlorthalidone  25 mg Oral Daily  . insulin aspart  0-15 Units Subcutaneous TID WC  . insulin aspart  10 Units Subcutaneous TID WC  . insulin glargine  55 Units Subcutaneous QHS  . irbesartan  300 mg Oral QHS  . pantoprazole  40 mg Oral Daily  . polyethylene glycol-electrolytes  4,000 mL Oral Once  . sodium chloride  3 mL Intravenous Q12H   Continuous Infusions: . sodium chloride     PRN Meds:.acetaminophen, acetaminophen  Allergies as of  06/04/2013  . (No Known Allergies)    Family History  Problem Relation Age of Onset  . Hypertension Sister   . Hypertension Brother   . Diabetes Brother   . Hypertension Brother   . Hypertension Sister     History   Social History  . Marital Status: Single    Spouse Name: N/A    Number of Children: N/A  . Years of Education: N/A   Occupational History  . Not on file.   Social History Main Topics  . Smoking status: Never Smoker   . Smokeless tobacco: Not on file  . Alcohol Use: No  . Drug Use: No  . Sexual Activity: Not on file    Other Topics Concern  . Not on file   Social History Narrative  . No narrative on file    Review of Systems: All negative except as stated above in HPI.  Physical Exam: Vital signs: Filed Vitals:   06/05/13 1000  BP: 145/85  Pulse: 78  Temp: 98.6 F (37 C)  Resp: 20   Last BM Date: 06/04/13 General:   Alert,  Well-developed, well-nourished, pleasant and cooperative in NAD Lungs:  Clear throughout to auscultation.   No wheezes, crackles, or rhonchi. No acute distress. Heart:  Regular rate and rhythm; no murmurs, clicks, rubs,  or gallops. Abdomen: soft, nontender, nondistended, +BS  Rectal:  Deferred Ext: no edema  GI:  Lab Results:  Recent Labs  06/04/13 1353 06/04/13 1942 06/05/13 0400  WBC 9.9 9.3 8.2  HGB 9.2* 9.7* 8.7*  HCT 27.8* 29.5* 27.0*  PLT 211 208 202   BMET  Recent Labs  06/04/13 1353 06/05/13 0400  NA 138 140  K 3.8 3.4*  CL 101 103  CO2 25 29  GLUCOSE 93 112*  BUN 18 14  CREATININE 1.37* 1.29  CALCIUM 10.1 9.4   LFT  Recent Labs  06/04/13 1353  PROT 7.2  ALBUMIN 3.4*  AST 24  ALT 21  ALKPHOS 68  BILITOT 0.1*   PT/INR  Recent Labs  06/04/13 1353  LABPROT 13.4  INR 1.04     Studies/Results: No results found.  Impression/Plan: 67 yo with rectal bleeding likely diverticular in origin and I would recommend a repeat colonoscopy as an inpt to further evaluate and look for malignancy and other possible sources. No evidence of ongoing bleeding. Clear liquids. Prep for colonoscopy today to be done tomorrow morning. NPO after midnight. Patient agreeable to proceed.    LOS: 1 day   Ayleah Hofmeister C.  06/05/2013, 12:28 PM

## 2013-06-05 NOTE — Progress Notes (Signed)
Utilization review completed.  

## 2013-06-06 ENCOUNTER — Encounter (HOSPITAL_COMMUNITY)
Admission: EM | Disposition: A | Discharge status: Home / Self Care | Payer: Self-pay | Source: Home / Self Care | Attending: Emergency Medicine

## 2013-06-06 ENCOUNTER — Encounter (HOSPITAL_COMMUNITY): Payer: Self-pay | Admitting: *Deleted

## 2013-06-06 DIAGNOSIS — K922 Gastrointestinal hemorrhage, unspecified: Secondary | ICD-10-CM | POA: Diagnosis not present

## 2013-06-06 DIAGNOSIS — E785 Hyperlipidemia, unspecified: Secondary | ICD-10-CM | POA: Diagnosis not present

## 2013-06-06 DIAGNOSIS — N182 Chronic kidney disease, stage 2 (mild): Secondary | ICD-10-CM | POA: Diagnosis not present

## 2013-06-06 DIAGNOSIS — D126 Benign neoplasm of colon, unspecified: Secondary | ICD-10-CM | POA: Diagnosis not present

## 2013-06-06 HISTORY — PX: COLONOSCOPY: SHX5424

## 2013-06-06 LAB — GLUCOSE, CAPILLARY: Glucose-Capillary: 155 mg/dL — ABNORMAL HIGH (ref 70–99)

## 2013-06-06 LAB — CBC
HCT: 26.9 % — ABNORMAL LOW (ref 39.0–52.0)
HCT: 30.5 % — ABNORMAL LOW (ref 39.0–52.0)
MCH: 26.4 pg (ref 26.0–34.0)
MCH: 26.2 pg (ref 26.0–34.0)
MCV: 79.8 fL (ref 78.0–100.0)
MCV: 80.1 fL (ref 78.0–100.0)
Platelets: 214 10*3/uL (ref 150–400)
Platelets: 235 10*3/uL (ref 150–400)
RBC: 3.37 MIL/uL — ABNORMAL LOW (ref 4.22–5.81)
RBC: 3.81 MIL/uL — ABNORMAL LOW (ref 4.22–5.81)
RDW: 17.4 % — ABNORMAL HIGH (ref 11.5–15.5)

## 2013-06-06 SURGERY — COLONOSCOPY
Anesthesia: Moderate Sedation

## 2013-06-06 MED ORDER — FENTANYL CITRATE 0.05 MG/ML IJ SOLN
INTRAMUSCULAR | Status: DC | PRN
  Administered 2013-06-06 (×4): 25 ug via INTRAVENOUS

## 2013-06-06 MED ORDER — FENTANYL CITRATE 0.05 MG/ML IJ SOLN
INTRAMUSCULAR | Status: AC
  Filled 2013-06-06: qty 2

## 2013-06-06 MED ORDER — MIDAZOLAM HCL 5 MG/5ML IJ SOLN
INTRAMUSCULAR | Status: DC | PRN
  Administered 2013-06-06: 2 mg via INTRAVENOUS
  Administered 2013-06-06: 1 mg via INTRAVENOUS
  Administered 2013-06-06 (×2): 2 mg via INTRAVENOUS

## 2013-06-06 NOTE — Discharge Summary (Signed)
Physician Discharge Summary  Adrian Matthews ZOX:[id number] DOB: 1946/01/27 DOA: 06/04/2013  PCP: Bufford Spikes, DO  Admit date: 06/04/2013 Discharge date: 06/06/2013  Time spent: 30 minutes  Recommendations for Outpatient Follow-up:  1. Follow up with PCP in 1-2 weeks 2. Hold aspirin for one week  Discharge Diagnoses:   Rectal Bleeding - Likely minor diverticular hemorrhage Active Problems:   Type II or unspecified type diabetes mellitus without mention of complication, uncontrolled   Hyperlipidemia LDL goal < 100   Unspecified essential hypertension   Osteoarthrosis, unspecified whether generalized or localized, lower leg   Monoclonal paraproteinemia   Obesity   Rectal bleeding   Chronic kidney disease (CKD), stage II (mild)   Discharge Condition: Stable  Diet recommendation: Diabetic  Filed Weights   06/04/13 1740 06/05/13 2200  Weight: 142.9 kg (315 lb 0.6 oz) 143 kg (315 lb 4.1 oz)   History of present illness:  67 year old obese male with history of hypertension, diabetes mellitus on high-dose insulin, (hemoglobin A1c in May of 7.2) , CKD stage II, GERD, hyperlipidemia, osteoarthritis who was in his usual state of health when he noticed that your blood in the commode while he was having a bowel movement this morning. He then brought up and went for the shower and again had a large amount of bright a blood per rectum and thus came to the ED. Patient denies any headache, dizziness, lightheadedness, syncope, nausea, vomiting, fever, chills, chest pain, palpitations, shortness of breath, abdominal pain or cramps. Denies change in bowel symptoms. Denies any hematuria or urinary symptoms. Denies loss of weight or appetite. He reports having one incidence of similar symptoms earlier this year which subsided on its own. Denies any history of hemorrhoids. Denies use of NSAIDs, he reports having a colonoscopy about 6 years ago at [facility].  Hospital Course:  The patient was admitted to  the floor. Gastroenterology was consulted. The patient's blood counts remained stable through his hospital say. The patient ultimately underwent colonoscopy on 06/06/13.  Procedures:  Colonoscopy 06/06/13  Consultations:  Gastroenterology  Discharge Exam: Filed Vitals:   06/06/13 1044 06/06/13 1055 06/06/13 1105 06/06/13 1317  BP: 135/74 138/77 127/69 124/92  Pulse:  77 75 84  Temp: 97.6 F (36.4 C)   97.9 F (36.6 C)  TempSrc: Oral   Oral  Resp: 18 19 15 18   Height:      Weight:      SpO2: 100% 97% 99% 95%    General: Awake, in nad Cardiovascular: regular, s1, s2 Respiratory: normal resp effort, no wheezing  Discharge Instructions       Future Appointments Provider Department Dept Phone   06/30/2013 8:15 AM [facility] [phone]   07/04/2013 8:30 AM Edison Pace, RPH-CPP [facility] [phone]   08/08/2013 8:30 AM Kermit Balo, DO [facility] [phone]       Medication List    STOP taking these medications       aspirin 81 MG tablet      TAKE these medications       amLODipine 10 MG tablet  Commonly known as:  NORVASC  Take 10 mg by mouth daily.     chlorthalidone 25 MG tablet  Commonly known as:  HYGROTON  Take one tablet by mouth once daily for blood pressure.     esomeprazole 40 MG capsule  Commonly known as:  NEXIUM  Take 1 capsule (40 mg total) by mouth daily.     insulin glargine 100  UNIT/ML injection  Commonly known as:  LANTUS  Inject 55 units once day for diabetes Dx. 250.00     insulin lispro 100 UNIT/ML Sopn  Commonly known as:  HUMALOG KWIKPEN  Inject 22 units before each meal plus sliding scale.     irbesartan 300 MG tablet  Commonly known as:  AVAPRO  Take 300 mg by mouth at bedtime. Take one tablet once daily to control blood pressure.     pantoprazole 40 MG tablet  Commonly known as:  PROTONIX  Take 40 mg by mouth daily. Take one tablet once daily for acid reflux.     potassium  chloride 10 MEQ tablet  Commonly known as:  K-DUR  Take 10 mEq by mouth 2 (two) times daily. Take 2 tablets by mouth daily.     rosuvastatin 20 MG tablet  Commonly known as:  CRESTOR  Take 20 mg by mouth daily. Take one tablet once daily for cholesterol.       No Known Allergies Follow-up Information   Follow up with REED, TIFFANY, DO In 1 week.   Specialty:  Geriatric Medicine   Contact information:   [address] [place] [address] [phone]        The results of significant diagnostics from this hospitalization (including imaging, microbiology, ancillary and laboratory) are listed below for reference.    Significant Diagnostic Studies: No results found.  Microbiology: No results found for this or any previous visit (from the past 240 hour(s)).   Labs: Basic Metabolic Panel:  Recent Labs Lab 06/04/13 1353 06/05/13 0400  NA 138 140  K 3.8 3.4*  CL 101 103  CO2 25 29  GLUCOSE 93 112*  BUN 18 14  CREATININE 1.37* 1.29  CALCIUM 10.1 9.4   Liver Function Tests:  Recent Labs Lab 06/04/13 1353  AST 24  ALT 21  ALKPHOS 68  BILITOT 0.1*  PROT 7.2  ALBUMIN 3.4*   No results found for this basename: LIPASE, AMYLASE,  in the last 168 hours No results found for this basename: AMMONIA,  in the last 168 hours CBC:  Recent Labs Lab 06/04/13 1353  06/05/13 0400 06/05/13 1438 06/05/13 2030 06/06/13 0445 06/06/13 1204  WBC 9.9  < > 8.2 8.5 8.5 8.4 8.8  NEUTROABS 5.5  --   --   --   --   --   --   HGB 9.2*  < > 8.7* 9.2* 9.1* 8.9* 10.0*  HCT 27.8*  < > 27.0* 28.4* 27.9* 26.9* 30.5*  MCV 79.7  < > 80.4 80.0 80.2 79.8 80.1  PLT 211  < > 202 199 228 214 235  < > = values in this interval not displayed. Cardiac Enzymes: No results found for this basename: CKTOTAL, CKMB, CKMBINDEX, TROPONINI,  in the last 168 hours BNP: BNP (last 3 results) No results found for this basename: PROBNP,  in the last 8760 hours CBG:  Recent Labs Lab 06/05/13 1128  06/05/13 1710 06/05/13 2116 06/06/13 0808 06/06/13 1145  GLUCAP 156* 108* 215* 130* 155*       Signed:  CHIU, STEPHEN K  [facility] 06/06/2013, 3:12 PM

## 2013-06-06 NOTE — Op Note (Signed)
[facility] [address] [place], [address]   COLONOSCOPY PROCEDURE REPORT  PATIENT: Adrian, Matthews  MR#: [id number] BIRTHDATE: 29-Apr-1946 , 67  yrs. old GENDER: Male ENDOSCOPIST: Bernette Redbird, MD REFERRED BY:   Dr. Dorris Fetch (PCP--[facility]) PROCEDURE DATE:  06/06/2013 PROCEDURE:     colonoscopy with biopsies ASA CLASS: INDICATIONS:  2 episodes of painless bright red blood per rectum prior to admission to the hospital yesterday. No further bleeding, and no drop in hemoglobin MEDICATIONS:    fentanyl 100 mcg, Versed 7 mg IV  DESCRIPTION OF PROCEDURE: the patient was brought from his hospital room to the [facility] cone endoscopy unit and, after providing written consent and doing time out, and having discussed the purpose and risks of the procedure with the patient, he received the above sedation. He remained stable throughout the procedure.  Digital exam was limited due to the patient's large, muscular body habitus, so I could not feel the prostate gland. There were no obvious prolapsed hemorrhoids or abnormalities within the anal canal.  The Pentax adult video colonoscope was readily advanced to the cecum and for a short distance into a normal-appearing terminal ileum. The ileal cecal valve and appendiceal orifice were also identified and photographed. Pullback was then performed.  There was no blood whatsoever in the colonic lumen.  In the left colon, perhaps in the sigmoid region, there were a few diverticula. However, this patient did not have extensive diverticulosis.  I encountered 2 or 3 diminutive, sessile polyps in the left colon, removed by cold biopsy technique.  No colitis, vascular ectasia, or masses were observed during this exam.  Retroflexion in the rectum was unremarkable.  Pullout through the anal canal revealed mild internal hemorrhoids.     COMPLICATIONS: None  ENDOSCOPIC IMPRESSION:  1. No active  bleeding or blood in the colonic lumen at the time this exam. 2. Mild left-sided diverticulosis, which is felt to be the most likely explanation for the patient's observed bleeding. 3. Mild internal hemorrhoids, which could conceivably account for the patient's bleeding although ordinarily hemorrhoidal bleeding occurs in association with defecation rather than spontaneously. 4. Several diminutive sessile polyps removed as described above.  RECOMMENDATIONS:  1. Okay to advance diet and discharge patient, from the GI tract standpoint. 2. Await pathology on colon polyps, with surveillance colonoscopy in 3-5 years if adenomatous tissue is present. 3. I would remain off aspirin for one week to help allow maturation of any clots that may have formed in a diverticulum. 4. I do not feel that upper endoscopy is needed, despite his history of aspirin exposure, given the clinical pattern of bleeding, the stability of his hemoglobin, and so forth.    _______________________________ eSignedBernette Redbird, MD 06/06/2013 10:59 AM     PATIENT NAME:  Adrian, Matthews MR#: [id number]

## 2013-06-06 NOTE — Progress Notes (Signed)
Patient was discharged home with instructions. Patient was given information on upcoming appointments. Patient was told to contact doctor with questions and concerns. Patient walked out of hospital. Patient was asked if he needed assistance and he declined. Patient was stable upon discharge.

## 2013-06-06 NOTE — Progress Notes (Signed)
Colonoscopy well-tolerated; please see proc rept.  I think this was most likely a self-limited, minor diverticular hemorrhage.  Recomm:  1.  Ok for dischg today from my standpoint; have advised pt to call me or PCP in event of significant rebleeding.  2.  Have advised pt to stay off ASA for another week.  3.  I will f/u w/ pt by telephone regarding the path results on the tiny polyps I removed during his exam.  Will sign off, call me if questions.   Florencia Reasons, M.D. [phone]

## 2013-06-06 NOTE — Interval H&P Note (Signed)
History and Physical Interval Note:  06/06/2013 9:42 AM  Adrian Matthews  has presented today for surgery, with the diagnosis of rectal bleeding  The various methods of treatment have been discussed with the patient. After consideration of risks, benefits and other options for treatment, the patient has consented to  Procedure(s): COLONOSCOPY (N/A) as a surgical intervention .  The patient's history has been reviewed, patient examined, no change in status, stable for surgery.  I have reviewed the patient's chart and labs.  Questions were answered to the patient's satisfaction.     Florencia Reasons

## 2013-06-07 ENCOUNTER — Encounter (HOSPITAL_COMMUNITY): Payer: Self-pay | Admitting: Gastroenterology

## 2013-06-07 DIAGNOSIS — N2581 Secondary hyperparathyroidism of renal origin: Secondary | ICD-10-CM | POA: Diagnosis not present

## 2013-06-07 DIAGNOSIS — D649 Anemia, unspecified: Secondary | ICD-10-CM | POA: Diagnosis not present

## 2013-06-07 DIAGNOSIS — N182 Chronic kidney disease, stage 2 (mild): Secondary | ICD-10-CM | POA: Diagnosis not present

## 2013-06-16 DIAGNOSIS — N2581 Secondary hyperparathyroidism of renal origin: Secondary | ICD-10-CM | POA: Diagnosis not present

## 2013-06-16 DIAGNOSIS — D649 Anemia, unspecified: Secondary | ICD-10-CM | POA: Diagnosis not present

## 2013-06-16 DIAGNOSIS — N183 Chronic kidney disease, stage 3 unspecified: Secondary | ICD-10-CM | POA: Diagnosis not present

## 2013-06-16 DIAGNOSIS — I129 Hypertensive chronic kidney disease with stage 1 through stage 4 chronic kidney disease, or unspecified chronic kidney disease: Secondary | ICD-10-CM | POA: Diagnosis not present

## 2013-06-20 ENCOUNTER — Other Ambulatory Visit: Payer: Self-pay | Admitting: Internal Medicine

## 2013-06-20 DIAGNOSIS — D62 Acute posthemorrhagic anemia: Secondary | ICD-10-CM

## 2013-06-20 MED ORDER — ANIMI-3 1 MG PO CAPS: 1.0000 | ORAL_CAPSULE | Freq: Every day | ORAL | Status: DC

## 2013-06-27 DIAGNOSIS — N183 Chronic kidney disease, stage 3 unspecified: Secondary | ICD-10-CM | POA: Diagnosis not present

## 2013-06-27 DIAGNOSIS — D649 Anemia, unspecified: Secondary | ICD-10-CM | POA: Diagnosis not present

## 2013-06-30 ENCOUNTER — Other Ambulatory Visit: Payer: Medicare Other

## 2013-06-30 DIAGNOSIS — I10 Essential (primary) hypertension: Secondary | ICD-10-CM

## 2013-06-30 DIAGNOSIS — IMO0001 Reserved for inherently not codable concepts without codable children: Secondary | ICD-10-CM

## 2013-07-01 LAB — COMPREHENSIVE METABOLIC PANEL
ALT: 12 IU/L (ref 0–44)
AST: 17 IU/L (ref 0–40)
Albumin/Globulin Ratio: 1.5 (ref 1.1–2.5)
Albumin: 4 g/dL (ref 3.6–4.8)
Alkaline Phosphatase: 80 IU/L (ref 39–117)
BUN/Creatinine Ratio: 16 (ref 10–22)
BUN: 23 mg/dL (ref 8–27)
CO2: 24 mmol/L (ref 18–29)
Calcium: 9.8 mg/dL (ref 8.6–10.2)
Chloride: 99 mmol/L (ref 97–108)
Creatinine, Ser: 1.46 mg/dL — ABNORMAL HIGH (ref 0.76–1.27)
GFR calc Af Amer: 57 mL/min/{1.73_m2} — ABNORMAL LOW (ref >59)
GFR calc non Af Amer: 49 mL/min/{1.73_m2} — ABNORMAL LOW (ref >59)
Globulin, Total: 2.6 g/dL (ref 1.5–4.5)
Glucose: 147 mg/dL — ABNORMAL HIGH (ref 65–99)
Potassium: 4.3 mmol/L (ref 3.5–5.2)
Sodium: 139 mmol/L (ref 134–144)
Total Bilirubin: 0.2 mg/dL (ref 0.0–1.2)
Total Protein: 6.6 g/dL (ref 6.0–8.5)

## 2013-07-01 LAB — HEMOGLOBIN A1C
Est. average glucose Bld gHb Est-mCnc: 146 mg/dL
Hgb A1c MFr Bld: 6.7 % — ABNORMAL HIGH (ref 4.8–5.6)

## 2013-07-01 LAB — MICROALBUMIN / CREATININE URINE RATIO
Creatinine, Ur: 76.1 mg/dL (ref 22.0–328.0)
MICROALB/CREAT RATIO: <3.9 mg/g creat (ref 0.0–30.0)
Microalbumin, Urine: <3 ug/mL (ref 0.0–17.0)

## 2013-07-01 LAB — LIPID PANEL
Chol/HDL Ratio: 3.1 ratio units (ref 0.0–5.0)
Cholesterol, Total: 132 mg/dL (ref 100–199)
HDL: 43 mg/dL (ref >39)
LDL Calculated: 71 mg/dL (ref 0–99)
Triglycerides: 89 mg/dL (ref 0–149)
VLDL Cholesterol Cal: 18 mg/dL (ref 5–40)

## 2013-07-04 ENCOUNTER — Ambulatory Visit (INDEPENDENT_AMBULATORY_CARE_PROVIDER_SITE_OTHER): Payer: Medicare Other | Admitting: Pharmacotherapy

## 2013-07-04 ENCOUNTER — Encounter: Payer: Self-pay | Admitting: Pharmacotherapy

## 2013-07-04 VITALS — BP 144/80 | HR 86 | Temp 97.9°F | Wt 317.0 lb

## 2013-07-04 DIAGNOSIS — IMO0001 Reserved for inherently not codable concepts without codable children: Secondary | ICD-10-CM

## 2013-07-04 DIAGNOSIS — E785 Hyperlipidemia, unspecified: Secondary | ICD-10-CM

## 2013-07-04 DIAGNOSIS — Z23 Encounter for immunization: Secondary | ICD-10-CM | POA: Diagnosis not present

## 2013-07-04 DIAGNOSIS — I10 Essential (primary) hypertension: Secondary | ICD-10-CM

## 2013-07-04 NOTE — Patient Instructions (Signed)
Keep up the good work

## 2013-07-04 NOTE — Progress Notes (Signed)
  Subjective:    Adrian Matthews is a 67 y.o. male who presents for follow-up of Type 2 diabetes mellitus.  Most recent A1C is 6.7% Cheated on meal plan while on vacation.  Back to eating healthy now. He is doing elliptical and walking for exercise. Denies problems with feet. Denies problems with vision.  Saw Dr. Emily Filbert last month.  He does need reading glasses. Nocturia at least 3 times per night. Denies peripheral edema. Still seeing Dr. Allena Katz for kidneys.  Current Lantus dose is 55 units daily. Current Humalog 22 units with each meal (usually)  BG:  85-227 No hypoglycemia.  Has stopped eating bread and red meat. Complaining of allergy symptoms.  Review of Systems A comprehensive review of systems was negative except for: Eyes: positive for needs reading glasses Genitourinary: positive for nocturia    Objective:    BP 144/80  Pulse 86  Temp(Src) 97.9 F (36.6 C) (Oral)  Wt 317 lb (143.79 kg)  BMI 44.23 kg/m2  SpO2 99%  General:  alert, cooperative, no distress and moderately obese  Oropharynx: normal findings: lips normal without lesions and gums healthy   Eyes:  negative findings: lids and lashes normal and corneas clear   Ears:  external ears normal        Lung: clear to auscultation bilaterally  Heart:  regular rate and rhythm     Extremities: no edema  Skin: warm and dry, no hyperpigmentation, vitiligo, or suspicious lesions     Neuro: mental status, speech normal, alert and oriented x3 and gait and station normal   Lab Review Glucose (mg/dL)  Date Value  1/61/0960 147*  02/24/2013 91      Glucose, Bld (mg/dL)  Date Value  4/54/0981 112*  06/04/2013 93   02/17/2011 175*     CO2 (mmol/L)  Date Value  06/30/2013 24   06/05/2013 29   06/04/2013 25      BUN (mg/dL)  Date Value  1/91/4782 23   06/05/2013 14   06/04/2013 18   02/24/2013 21   02/17/2011 25*     Creatinine (mg/dL)  Date Value  9/56/2130 1.87*  08/22/2009 1.85*     Creatinine, Ser  (mg/dL)  Date Value  8/65/7846 1.46*  06/05/2013 1.29   06/04/2013 1.37*    06/30/13: A1C:  6.7% AST:  17 ALT:  12 Total cholesterol:  132 Triglycerides:  89 HDL:  43 LDL:  71 Microalbumin:  <3.0  Assessment:    Diabetes Mellitus type II, under excellent control.  BP close to goal <140/80 LDL at goal <100   Plan:    1.  Rx changes: none 2.  Continue Lantus 55 units daily. 3.  Continue Humalog 22 units before each meal (plus sliding scale if needed). 4.  Reviewed nutrition and exercise goals. 5.  HTN slightly above goal <140/80.  Usually OK.  Will continue amlodipine, chlorthalidone, and irbesartan and monitor. 6.  LDL at goal <100.  No myalgias.  Continue Crestor.

## 2013-07-11 ENCOUNTER — Encounter: Payer: Self-pay | Admitting: Internal Medicine

## 2013-08-08 ENCOUNTER — Ambulatory Visit (INDEPENDENT_AMBULATORY_CARE_PROVIDER_SITE_OTHER): Payer: Medicare Other | Admitting: Internal Medicine

## 2013-08-08 ENCOUNTER — Encounter: Payer: Self-pay | Admitting: Internal Medicine

## 2013-08-08 VITALS — BP 124/76 | HR 75 | Temp 98.1°F | Resp 16 | Wt 325.0 lb

## 2013-08-08 DIAGNOSIS — K5731 Diverticulosis of large intestine without perforation or abscess with bleeding: Secondary | ICD-10-CM

## 2013-08-08 DIAGNOSIS — IMO0001 Reserved for inherently not codable concepts without codable children: Secondary | ICD-10-CM

## 2013-08-08 DIAGNOSIS — D62 Acute posthemorrhagic anemia: Secondary | ICD-10-CM

## 2013-08-08 DIAGNOSIS — Z23 Encounter for immunization: Secondary | ICD-10-CM

## 2013-08-08 DIAGNOSIS — I10 Essential (primary) hypertension: Secondary | ICD-10-CM

## 2013-08-08 DIAGNOSIS — E785 Hyperlipidemia, unspecified: Secondary | ICD-10-CM

## 2013-08-08 MED ORDER — ZOSTER VACCINE LIVE 19400 UNT/0.65ML ~~LOC~~ SOLR: 0.6500 mL | Freq: Once | SUBCUTANEOUS | Status: DC

## 2013-08-08 NOTE — Progress Notes (Signed)
Patient ID: Adrian Matthews, male   DOB: 05-Aug-1946, 67 y.o.   MRN: [id number] Location:  [facility]  No Known Allergies  Chief Complaint  Patient presents with  . Medical Managment of Chronic Issues    4 month f/u  . Immunizations    wants Shingles; needs Pneumo, Tdap>10 yrs    HPI: Patient is a 67 y.o. black male seen in the office today for for medical mgt of chronic diseases.  Says he is just keeping on.  Has been going to the gym at 430am.  Says he is almost eating properly.  Did well until he saw how well he was doing.  Larey Seat off bandwagon a little bit.  Sugars when he checks average around 150 and lower.  hba1c 6.7.    No further rectal bleeding.     Review of Systems:  Review of Systems  Constitutional: Negative for fever and weight loss.  HENT: Negative for congestion.   Eyes: Negative for blurred vision.  Respiratory: Negative for shortness of breath.   Cardiovascular: Negative for chest pain and leg swelling.  Gastrointestinal: Negative for constipation, blood in stool and melena.  Genitourinary: Negative for dysuria, urgency and frequency.       3-4x nocturia  Musculoskeletal: Negative for falls and myalgias.  Skin: Negative for rash.  Neurological: Negative for dizziness.  Endo/Heme/Allergies: Does not bruise/bleed easily.  Psychiatric/Behavioral: Negative for depression.     Past Medical History  Diagnosis Date  . Diabetes mellitus   . Hypertension   . Hyperlipidemia   . Osteoarthrosis, unspecified whether generalized or localized, lower leg   . Primary localized osteoarthrosis, unspecified site   . Asymptomatic varicose veins   . New daily persistent headache   . Unspecified hereditary and idiopathic peripheral neuropathy   . Chronic kidney disease   . Monoclonal paraproteinemia   . Gout, unspecified   . Hypercalcemia   . Hyperosmolality and/or hypernatremia   . Diaphragmatic hernia without mention of obstruction  or gangrene   . Unspecified disorder of kidney and ureter   . Other malaise and fatigue   . GERD (gastroesophageal reflux disease)     Past Surgical History  Procedure Laterality Date  . Carpal tunnel release  2007  . Colonoscopy N/A 06/06/2013    Procedure: COLONOSCOPY;  Surgeon: Florencia Reasons, MD;  Location: [name] ENDOSCOPY;  Service: Endoscopy;  Laterality: N/A;    Social History:   reports that he has never smoked. He does not have any smokeless tobacco history on file. He reports that he does not drink alcohol or use illicit drugs.  Family History  Problem Relation Age of Onset  . Hypertension Sister   . Hypertension Brother   . Diabetes Brother   . Hypertension Brother   . Hypertension Sister     Medications: Patient's Medications  New Prescriptions   No medications on file  Previous Medications   AMLODIPINE (NORVASC) 10 MG TABLET    Take 10 mg by mouth daily.   CHLORTHALIDONE (HYGROTON) 25 MG TABLET    Take one tablet by mouth once daily for blood pressure.   ESOMEPRAZOLE (NEXIUM) 40 MG CAPSULE    Take 1 capsule (40 mg total) by mouth daily.   FA-B6-B12-D-OMEGA 3-PHYTOSTER (ANIMI-3) 1 MG CAPS    Take 1 capsule (1 mg total) by mouth daily.   INSULIN GLARGINE (LANTUS) 100 UNIT/ML INJECTION    Inject 55 units once day for diabetes Dx. 250.00   INSULIN  LISPRO (HUMALOG KWIKPEN) 100 UNIT/ML SOLN    Inject 22 units before each meal plus sliding scale.   IRBESARTAN (AVAPRO) 300 MG TABLET    Take 300 mg by mouth at bedtime. Take one tablet once daily to control blood pressure.   POTASSIUM CHLORIDE (K-DUR) 10 MEQ TABLET    Take 10 mEq by mouth 2 (two) times daily. Take 2 tablets by mouth daily.   ROSUVASTATIN (CRESTOR) 20 MG TABLET    Take 20 mg by mouth daily. Take one tablet once daily for cholesterol.   ZOSTER VACCINE LIVE [place]    Inject 0.5 mLs into the skin.  Modified Medications   No medications on file  Discontinued Medications   No medications on file     Physical  Exam: Filed Vitals:   08/08/13 0830  BP: 124/76  Pulse: 75  Temp: 98.1 F (36.7 C)  TempSrc: Oral  Resp: 16  Weight: 325 lb (147.419 kg)  SpO2: 99%  Physical Exam  Constitutional: He is oriented to person, place, and time. He appears well-developed and well-nourished. No distress.  Obese black male  HENT:  Head: Normocephalic and atraumatic.  Cardiovascular: Normal rate, regular rhythm, normal heart sounds and intact distal pulses.   Pulmonary/Chest: Effort normal and breath sounds normal. No respiratory distress.  Abdominal: Soft. Bowel sounds are normal. He exhibits no distension. There is no tenderness.  Musculoskeletal: Normal range of motion. He exhibits edema. He exhibits no tenderness.  Mild edema bilaterally, nonpitting  Neurological: He is alert and oriented to person, place, and time.  Skin: Skin is warm and dry.    Labs reviewed: Basic Metabolic Panel:  Recent Labs  40/98/11 1353 06/05/13 0400 06/30/13 0903  NA 138 140 139  K 3.8 3.4* 4.3  CL 101 103 99  CO2 25 29 24   GLUCOSE 93 112* 147*  BUN 18 14 23   CREATININE 1.37* 1.29 1.46*  CALCIUM 10.1 9.4 9.8   Liver Function Tests:  Recent Labs  02/24/13 0913 06/04/13 1353 06/30/13 0903  AST 17 24 17   ALT 14 21 12   ALKPHOS 78 68 80  BILITOT 0.2 0.1* 0.2  PROT 6.7 7.2 6.6  ALBUMIN  --  3.4*  --   CBC:  Recent Labs  06/04/13 1353  06/05/13 2030 06/06/13 0445 06/06/13 1204  WBC 9.9  < > 8.5 8.4 8.8  NEUTROABS 5.5  --   --   --   --   HGB 9.2*  < > 9.1* 8.9* 10.0*  HCT 27.8*  < > 27.9* 26.9* 30.5*  MCV 79.7  < > 80.2 79.8 80.1  PLT 211  < > 228 214 235  < > = values in this interval not displayed. Lipid Panel:  Recent Labs  02/24/13 0913 06/30/13 0903  HDL 44 43  LDLCALC 69 71  TRIG 99 89  CHOLHDL 3.0 3.1   Lab Results  Component Value Date   HGBA1C 6.7* 06/30/2013    Past Procedures: cscope in 06/06/13:  No active bleeding or blood in colon, mild left diverticulosis (thought to be  source of bleeding), mild internal hemorrhoids, several diminuitive polyps removed (pt says benign)  Assessment/Plan 1. Need for prophylactic vaccination against Streptococcus pneumoniae (pneumococcus) - Pneumococcal polysaccharide vaccine 23-valent greater than or equal to 2yo subcutaneous/IM  2. Type II or unspecified type diabetes mellitus without mention of complication, uncontrolled -last hba1c at goal -continue exercise--cannot make himself go more than 3x per week to gym -needs to get back on the  diet bandwagon  3. Diverticulosis of colon with hemorrhage -had cscope 8/25 with diverticulosis, internal hemorrhoids and several small polyps (benign per pt)  -GI office to call pt when due for next cscope  4. Acute blood loss anemia -has improved, feels well  5. Unspecified essential hypertension -at goal with current meds and exercise -encouraged diet -has some edema due to eating salty foods with alumni weekend for [employer]  6. Other and unspecified hyperlipidemia -at goal with statin therapy, exercise  Will check with his insurance about what he will have to pay for zostavax Given pneumonia vaccine today.  Labs/tests ordered: already ordered by [facility] Next appt: 4 mos

## 2013-10-27 ENCOUNTER — Other Ambulatory Visit: Payer: Medicare Other

## 2013-10-27 DIAGNOSIS — IMO0001 Reserved for inherently not codable concepts without codable children: Secondary | ICD-10-CM

## 2013-10-27 DIAGNOSIS — E1165 Type 2 diabetes mellitus with hyperglycemia: Principal | ICD-10-CM

## 2013-10-27 DIAGNOSIS — E785 Hyperlipidemia, unspecified: Secondary | ICD-10-CM

## 2013-10-28 LAB — COMPREHENSIVE METABOLIC PANEL
ALT: 25 IU/L (ref 0–44)
AST: 32 IU/L (ref 0–40)
Albumin/Globulin Ratio: 1.7 (ref 1.1–2.5)
Albumin: 4.2 g/dL (ref 3.6–4.8)
Alkaline Phosphatase: 85 IU/L (ref 39–117)
BUN/Creatinine Ratio: 12 (ref 10–22)
BUN: 19 mg/dL (ref 8–27)
CO2: 23 mmol/L (ref 18–29)
Calcium: 10.1 mg/dL (ref 8.6–10.2)
Chloride: 96 mmol/L — ABNORMAL LOW (ref 97–108)
Creatinine, Ser: 1.55 mg/dL — ABNORMAL HIGH (ref 0.76–1.27)
GFR calc Af Amer: 53 mL/min/{1.73_m2} — ABNORMAL LOW (ref >59)
GFR calc non Af Amer: 46 mL/min/{1.73_m2} — ABNORMAL LOW (ref >59)
Globulin, Total: 2.5 g/dL (ref 1.5–4.5)
Glucose: 128 mg/dL — ABNORMAL HIGH (ref 65–99)
Potassium: 3.5 mmol/L (ref 3.5–5.2)
Sodium: 140 mmol/L (ref 134–144)
Total Bilirubin: 0.3 mg/dL (ref 0.0–1.2)
Total Protein: 6.7 g/dL (ref 6.0–8.5)

## 2013-10-28 LAB — LIPID PANEL
Chol/HDL Ratio: 3.7 ratio units (ref 0.0–5.0)
Cholesterol, Total: 151 mg/dL (ref 100–199)
HDL: 41 mg/dL (ref >39)
LDL Calculated: 87 mg/dL (ref 0–99)
Triglycerides: 117 mg/dL (ref 0–149)
VLDL Cholesterol Cal: 23 mg/dL (ref 5–40)

## 2013-10-28 LAB — HEMOGLOBIN A1C
Est. average glucose Bld gHb Est-mCnc: 169 mg/dL
Hgb A1c MFr Bld: 7.5 % — ABNORMAL HIGH (ref 4.8–5.6)

## 2013-10-31 ENCOUNTER — Encounter: Payer: Self-pay | Admitting: Pharmacotherapy

## 2013-10-31 ENCOUNTER — Ambulatory Visit (INDEPENDENT_AMBULATORY_CARE_PROVIDER_SITE_OTHER): Payer: Medicare Other | Admitting: Pharmacotherapy

## 2013-10-31 VITALS — BP 150/82 | HR 80 | Temp 97.7°F | Wt 323.8 lb

## 2013-10-31 DIAGNOSIS — IMO0001 Reserved for inherently not codable concepts without codable children: Secondary | ICD-10-CM

## 2013-10-31 DIAGNOSIS — I10 Essential (primary) hypertension: Secondary | ICD-10-CM

## 2013-10-31 DIAGNOSIS — E1165 Type 2 diabetes mellitus with hyperglycemia: Principal | ICD-10-CM

## 2013-10-31 DIAGNOSIS — E785 Hyperlipidemia, unspecified: Secondary | ICD-10-CM | POA: Diagnosis not present

## 2013-10-31 MED ORDER — INSULIN LISPRO 100 UNIT/ML (KWIKPEN): PEN_INJECTOR | SUBCUTANEOUS | Status: DC

## 2013-10-31 MED ORDER — IRBESARTAN 300 MG PO TABS: ORAL_TABLET | ORAL | Status: DC

## 2013-10-31 MED ORDER — INSULIN GLARGINE 100 UNIT/ML ~~LOC~~ SOLN: SUBCUTANEOUS | Status: DC

## 2013-10-31 MED ORDER — ROSUVASTATIN CALCIUM 20 MG PO TABS: ORAL_TABLET | ORAL | Status: DC

## 2013-10-31 NOTE — Progress Notes (Signed)
  Subjective:    Adrian Matthews is a 68 y.o.African American  male who presents for follow-up of Type 2 diabetes mellitus.  His diabetes control has slipped in the last three months.  Current A1C is 7.5%, usually <7%. He forgot to bring meter to office visit. Self reports his blood glucose 150-210.   Hypoglycemia x 1 (76m/dl) He goes to the gym at least twice a week.  Didn't exercise for a month while his gym was relocating. He has missed a few doses of insulin. He is trying to cut back on portion sizes.  Current Lantus dose is 55 units daily Current Novolog dose is 22 units with each meal - plus sliding scale.  Denies problems with feet. Denies problems with vision. Nocturia 2-3 times per night. Denies peripheral edema.  He is having a dry cough - thinks it is sinus related.  The heat dries him out.  Review of Systems A comprehensive review of systems was negative except for: Ears, nose, mouth, throat, and face: positive for sinus dryness Genitourinary: positive for nocturia Endocrine: positive for diabetic symptoms including heat intolerance    Objective:    BP 150/82  Pulse 80  Temp(Src) 97.7 F (36.5 C) (Oral)  Wt 323 lb 12.8 oz (146.875 kg)  SpO2 98%  General:  alert, cooperative and no distress  Oropharynx: normal findings: lips normal without lesions and gums healthy   Eyes:  negative findings: lids and lashes normal and conjunctivae and sclerae normal   Ears:  external ears normal        Lung: clear to auscultation bilaterally  Heart:  regular rate and rhythm     Extremities: no edema  Skin: warm and dry, no hyperpigmentation, vitiligo, or suspicious lesions     Neuro: mental status, speech normal, alert and oriented x3 and gait and station normal   Lab Review Glucose (mg/dL)  Date Value  10/27/2013 128*  06/30/2013 147*  02/24/2013 91      Glucose, Bld (mg/dL)  Date Value  06/05/2013 112*  06/04/2013 93   02/17/2011 175*     CO2 (mmol/L)  Date Value   10/27/2013 23   06/30/2013 24   06/05/2013 29      BUN (mg/dL)  Date Value  10/27/2013 19   06/30/2013 23   06/05/2013 14   06/04/2013 18   02/24/2013 21   02/17/2011 25*     Creatinine (mg/dL)  Date Value  12/07/2009 1.87*  08/22/2009 1.85*     Creatinine, Ser (mg/dL)  Date Value  10/27/2013 1.55*  06/30/2013 1.46*  06/05/2013 1.29     10/27/13: A1C:  7.5% AST:  32 ALT:  25 Total cholesterol:  151 LDL:  87 HDL:  41 Triglycerides:  117  Assessment:    Diabetes Mellitus type II, under good control.  BP above goal <140/90 LDL at goal <100   Plan:    1.  Rx changes: none 2.  Continue Lantus 55 units daily. 3.  Continue Novolog 22 units with each meal plus sliding scale as needed. 4.  Counseled on nutrition goals. 5.  Counseled on benefit of routine exercise.  Suspect higher A1C due to lack of exercise over the last 6 weeks. 6.  Counseled on foot care. 7.  BP above target today.  Usually OK.  Will continue Avapro and monitor. 8.  LDL at goal on Crestor without myalgias.

## 2013-10-31 NOTE — Patient Instructions (Signed)
Get back to exercising.

## 2013-12-09 ENCOUNTER — Ambulatory Visit: Payer: Self-pay | Admitting: Internal Medicine

## 2013-12-16 ENCOUNTER — Encounter: Payer: Self-pay | Admitting: Internal Medicine

## 2013-12-16 ENCOUNTER — Ambulatory Visit (INDEPENDENT_AMBULATORY_CARE_PROVIDER_SITE_OTHER): Payer: Medicare Other | Admitting: Internal Medicine

## 2013-12-16 VITALS — BP 142/80 | HR 78 | Temp 97.7°F | Wt 322.4 lb

## 2013-12-16 DIAGNOSIS — D472 Monoclonal gammopathy: Secondary | ICD-10-CM | POA: Diagnosis not present

## 2013-12-16 DIAGNOSIS — Z8639 Personal history of other endocrine, nutritional and metabolic disease: Secondary | ICD-10-CM | POA: Diagnosis not present

## 2013-12-16 DIAGNOSIS — N058 Unspecified nephritic syndrome with other morphologic changes: Secondary | ICD-10-CM | POA: Diagnosis not present

## 2013-12-16 DIAGNOSIS — E1129 Type 2 diabetes mellitus with other diabetic kidney complication: Secondary | ICD-10-CM | POA: Diagnosis not present

## 2013-12-16 DIAGNOSIS — I10 Essential (primary) hypertension: Secondary | ICD-10-CM

## 2013-12-16 DIAGNOSIS — M1710 Unilateral primary osteoarthritis, unspecified knee: Secondary | ICD-10-CM | POA: Diagnosis not present

## 2013-12-16 DIAGNOSIS — E785 Hyperlipidemia, unspecified: Secondary | ICD-10-CM

## 2013-12-16 DIAGNOSIS — E1121 Type 2 diabetes mellitus with diabetic nephropathy: Secondary | ICD-10-CM

## 2013-12-16 DIAGNOSIS — D631 Anemia in chronic kidney disease: Secondary | ICD-10-CM | POA: Diagnosis not present

## 2013-12-16 DIAGNOSIS — N183 Chronic kidney disease, stage 3 unspecified: Secondary | ICD-10-CM | POA: Diagnosis not present

## 2013-12-16 DIAGNOSIS — IMO0002 Reserved for concepts with insufficient information to code with codable children: Secondary | ICD-10-CM | POA: Diagnosis not present

## 2013-12-16 DIAGNOSIS — E1165 Type 2 diabetes mellitus with hyperglycemia: Secondary | ICD-10-CM

## 2013-12-16 MED ORDER — CHLORTHALIDONE 25 MG PO TABS: ORAL_TABLET | ORAL | Status: DC

## 2013-12-16 MED ORDER — POTASSIUM CHLORIDE ER 10 MEQ PO TBCR: 10.0000 meq | EXTENDED_RELEASE_TABLET | Freq: Two times a day (BID) | ORAL | Status: DC

## 2013-12-16 NOTE — Progress Notes (Signed)
Patient ID: Adrian Matthews, male   DOB: September 07, 1946, 68 y.o.   MRN: [id number]   Location:  [facility] / [place]  No Known Allergies  Chief Complaint  Patient presents with  . Medical Managment of Chronic Issues    4 month f/u & no recent labs    HPI: Patient is a 68 y.o. white male seen in the office today for medical mgt chronic diseases.  Says he has no problems except those that come naturally.  Went to lab at Dr. Serita Grit first today.  Is going to gym tomorrow.  Has been going to gym faithfully except ice and snow days.  Diet is fine.  Did have some difficulty over the holidays, but back on track.  Is on GI callback for cscope for 5 years.    Knees are doing as well as they are going to.  Left painful with the cold.  Has to crank up the heat.    Still has some urinary frequency.    Nexium does better for his acid reflux--had to take 2 omeprazole previously instead of one.    Review of Systems:  ROS   Past Medical History  Diagnosis Date  . Diabetes mellitus   . Hypertension   . Hyperlipidemia   . Osteoarthrosis, unspecified whether generalized or localized, lower leg   . Primary localized osteoarthrosis, unspecified site   . Asymptomatic varicose veins   . New daily persistent headache   . Unspecified hereditary and idiopathic peripheral neuropathy   . Chronic kidney disease   . Monoclonal paraproteinemia   . Gout, unspecified   . Hypercalcemia   . Hyperosmolality and/or hypernatremia   . Diaphragmatic hernia without mention of obstruction or gangrene   . Unspecified disorder of kidney and ureter   . Other malaise and fatigue   . GERD (gastroesophageal reflux disease)     Past Surgical History  Procedure Laterality Date  . Carpal tunnel release  2007  . Colonoscopy N/A 06/06/2013    Procedure: COLONOSCOPY;  Surgeon: Cleotis Nipper, MD;  Location: [facility] ENDOSCOPY;  Service: Endoscopy;  Laterality: N/A;    Social History:   reports  that he has never smoked. He does not have any smokeless tobacco history on file. He reports that he does not drink alcohol or use illicit drugs.  Family History  Problem Relation Age of Onset  . Hypertension Sister   . Hypertension Brother   . Diabetes Brother   . Hypertension Brother   . Hypertension Sister     Medications: Patient's Medications  New Prescriptions   No medications on file  Previous Medications   AMLODIPINE (NORVASC) 10 MG TABLET    Take 10 mg by mouth daily.   CHLORTHALIDONE (HYGROTON) 25 MG TABLET    Take one tablet by mouth once daily for blood pressure.   ESOMEPRAZOLE (NEXIUM) 40 MG CAPSULE    Take 1 capsule (40 mg total) by mouth daily.   FA-B6-B12-D-OMEGA 3-PHYTOSTER (ANIMI-3) 1 MG CAPS    Take 1 capsule (1 mg total) by mouth daily.   INSULIN GLARGINE (LANTUS) 100 UNIT/ML INJECTION    Inject 55 units once day for diabetes Dx. 250.00   INSULIN LISPRO (HUMALOG KWIKPEN) 100 UNIT/ML KIWKPEN    Inject 22 units before each meal plus sliding scale.   IRBESARTAN (AVAPRO) 300 MG TABLET    Take one tablet once daily to control blood pressure.   POTASSIUM CHLORIDE (K-DUR) 10 MEQ TABLET    Take  10 mEq by mouth 2 (two) times daily. Take 2 tablets by mouth daily.   ROSUVASTATIN (CRESTOR) 20 MG TABLET    Take one tablet once daily for cholesterol.  Modified Medications   No medications on file  Discontinued Medications   No medications on file     Physical Exam: Filed Vitals:   12/16/13 0921  BP: 142/80  Pulse: 78  Temp: 97.7 F (36.5 C)  TempSrc: Oral  Weight: 322 lb 6.4 oz (146.24 kg)  SpO2: 98%  Physical Exam  Labs reviewed: Basic Metabolic Panel:  Recent Labs  06/05/13 0400 06/30/13 0903 10/27/13 0822  NA 140 139 140  K 3.4* 4.3 3.5  CL 103 99 96*  CO2 29 24 23   GLUCOSE 112* 147* 128*  BUN 14 23 19   CREATININE 1.29 1.46* 1.55*  CALCIUM 9.4 9.8 10.1   Liver Function Tests:  Recent Labs  06/04/13 1353 06/30/13 0903 10/27/13 0822  AST 24  17 32  ALT 21 12 25   ALKPHOS 68 80 85  BILITOT 0.1* 0.2 0.3  PROT 7.2 6.6 6.7  ALBUMIN 3.4*  --   --   CBC:  Recent Labs  06/04/13 1353  06/05/13 2030 06/06/13 0445 06/06/13 1204  WBC 9.9  < > 8.5 8.4 8.8  NEUTROABS 5.5  --   --   --   --   HGB 9.2*  < > 9.1* 8.9* 10.0*  HCT 27.8*  < > 27.9* 26.9* 30.5*  MCV 79.7  < > 80.2 79.8 80.1  PLT 211  < > 228 214 235  < > = values in this interval not displayed. Lipid Panel:  Recent Labs  02/24/13 0913 06/30/13 0903 10/27/13 0822  HDL 44 43 41  LDLCALC 69 71 87  TRIG 99 89 117  CHOLHDL 3.0 3.1 3.7   Lab Results  Component Value Date   HGBA1C 7.5* 10/27/2013   Assessment/Plan 1. DM (diabetes mellitus), type 2, uncontrolled, with renal complications - SHF0Y could be a bit better, encouraged gym and diet  -follows with [facility]  2. Osteoarthrosis, unspecified whether generalized or localized, lower leg -improved in warmer vs. Colder weather  3. Monoclonal paraproteinemia -has been stable w/o progression to myeloma for many years, cont to monitor, no longer follows with hematology  4. Unspecified essential hypertension -bp at goal with meds, low sodium diet, exercise  5. Hyperlipidemia LDL goal < 100 -lipids at goal with current meds  6. Diabetic nephropathy -cont ARB  Labs/tests ordered: per [facility] Next appt:  4 mos

## 2013-12-19 ENCOUNTER — Other Ambulatory Visit: Payer: Self-pay | Admitting: *Deleted

## 2013-12-19 DIAGNOSIS — I10 Essential (primary) hypertension: Secondary | ICD-10-CM

## 2013-12-19 MED ORDER — POTASSIUM CHLORIDE ER 10 MEQ PO TBCR: EXTENDED_RELEASE_TABLET | ORAL | Status: DC

## 2013-12-19 NOTE — Telephone Encounter (Signed)
[facility]

## 2013-12-20 ENCOUNTER — Other Ambulatory Visit: Payer: Self-pay | Admitting: *Deleted

## 2013-12-20 DIAGNOSIS — IMO0001 Reserved for inherently not codable concepts without codable children: Secondary | ICD-10-CM

## 2013-12-20 DIAGNOSIS — I10 Essential (primary) hypertension: Secondary | ICD-10-CM

## 2013-12-20 DIAGNOSIS — E1165 Type 2 diabetes mellitus with hyperglycemia: Principal | ICD-10-CM

## 2013-12-20 MED ORDER — IRBESARTAN 300 MG PO TABS: ORAL_TABLET | ORAL | Status: DC

## 2013-12-20 MED ORDER — INSULIN LISPRO 100 UNIT/ML (KWIKPEN): PEN_INJECTOR | SUBCUTANEOUS | Status: DC

## 2013-12-20 NOTE — Telephone Encounter (Signed)
[facility]

## 2013-12-21 ENCOUNTER — Other Ambulatory Visit: Payer: Self-pay | Admitting: *Deleted

## 2013-12-21 DIAGNOSIS — IMO0001 Reserved for inherently not codable concepts without codable children: Secondary | ICD-10-CM

## 2013-12-21 DIAGNOSIS — E1165 Type 2 diabetes mellitus with hyperglycemia: Principal | ICD-10-CM

## 2013-12-21 MED ORDER — INSULIN LISPRO 100 UNIT/ML (KWIKPEN): PEN_INJECTOR | SUBCUTANEOUS | Status: DC

## 2013-12-23 ENCOUNTER — Other Ambulatory Visit: Payer: Self-pay | Admitting: *Deleted

## 2013-12-23 DIAGNOSIS — D631 Anemia in chronic kidney disease: Secondary | ICD-10-CM | POA: Diagnosis not present

## 2013-12-23 DIAGNOSIS — Z8639 Personal history of other endocrine, nutritional and metabolic disease: Secondary | ICD-10-CM | POA: Diagnosis not present

## 2013-12-23 DIAGNOSIS — I129 Hypertensive chronic kidney disease with stage 1 through stage 4 chronic kidney disease, or unspecified chronic kidney disease: Secondary | ICD-10-CM | POA: Diagnosis not present

## 2013-12-23 DIAGNOSIS — N183 Chronic kidney disease, stage 3 unspecified: Secondary | ICD-10-CM | POA: Diagnosis not present

## 2013-12-23 DIAGNOSIS — N039 Chronic nephritic syndrome with unspecified morphologic changes: Secondary | ICD-10-CM | POA: Diagnosis not present

## 2013-12-23 MED ORDER — IRBESARTAN 150 MG PO TABS: ORAL_TABLET | ORAL | Status: DC

## 2013-12-28 ENCOUNTER — Other Ambulatory Visit: Payer: Self-pay | Admitting: *Deleted

## 2013-12-28 MED ORDER — GLUCOSE BLOOD VI STRP: ORAL_STRIP | Status: DC

## 2013-12-28 NOTE — Telephone Encounter (Signed)
[facility]

## 2014-01-02 ENCOUNTER — Other Ambulatory Visit: Payer: Self-pay | Admitting: Internal Medicine

## 2014-01-02 DIAGNOSIS — E1121 Type 2 diabetes mellitus with diabetic nephropathy: Secondary | ICD-10-CM

## 2014-01-02 MED ORDER — LOSARTAN POTASSIUM 100 MG PO TABS: 100.0000 mg | ORAL_TABLET | Freq: Every day | ORAL | Status: DC

## 2014-01-02 NOTE — Progress Notes (Signed)
Got note from [facility] on a Friday that pt's avapro 300mg  daily was no longer covered and irbesartan 150mg  2 tabs daily would need to be prescribed.  Then on Monday, got another message from [employer] saying irbesartan is not covered at all--could use losartan unknown dose, telmisartan 30s, or valsartan unknown dose.  Prescribed losartan 100mg  daily for diabetic nephropathy.

## 2014-01-09 ENCOUNTER — Telehealth: Payer: Self-pay | Admitting: *Deleted

## 2014-01-09 ENCOUNTER — Other Ambulatory Visit: Payer: Self-pay | Admitting: *Deleted

## 2014-01-09 NOTE — Telephone Encounter (Signed)
Patient called and stated that he has received a letter from his insurance company stating they will no longer pay for his Avapro 300mg .  They do cover Diovan HCT, Exforge, Micardis and Twynsta. Please Advise.  Needs new Rx faxed to [facility]

## 2014-01-11 NOTE — Telephone Encounter (Signed)
I responded to a previous fax about this and changed him to losartan

## 2014-01-11 NOTE — Telephone Encounter (Signed)
Patient Notified

## 2014-02-23 ENCOUNTER — Other Ambulatory Visit: Payer: Medicare Other

## 2014-02-23 DIAGNOSIS — IMO0001 Reserved for inherently not codable concepts without codable children: Secondary | ICD-10-CM

## 2014-02-23 DIAGNOSIS — E785 Hyperlipidemia, unspecified: Secondary | ICD-10-CM

## 2014-02-23 DIAGNOSIS — E1165 Type 2 diabetes mellitus with hyperglycemia: Principal | ICD-10-CM

## 2014-02-24 LAB — COMPREHENSIVE METABOLIC PANEL
ALT: 16 IU/L (ref 0–44)
AST: 18 IU/L (ref 0–40)
Albumin/Globulin Ratio: 1.6 (ref 1.1–2.5)
Albumin: 4.3 g/dL (ref 3.6–4.8)
Alkaline Phosphatase: 82 IU/L (ref 39–117)
BUN/Creatinine Ratio: 15 (ref 10–22)
BUN: 21 mg/dL (ref 8–27)
CO2: 25 mmol/L (ref 18–29)
Calcium: 9.9 mg/dL (ref 8.6–10.2)
Chloride: 98 mmol/L (ref 97–108)
Creatinine, Ser: 1.44 mg/dL — ABNORMAL HIGH (ref 0.76–1.27)
GFR calc Af Amer: 58 mL/min/{1.73_m2} — ABNORMAL LOW (ref >59)
GFR calc non Af Amer: 50 mL/min/{1.73_m2} — ABNORMAL LOW (ref >59)
Globulin, Total: 2.7 g/dL (ref 1.5–4.5)
Glucose: 87 mg/dL (ref 65–99)
Potassium: 3.2 mmol/L — ABNORMAL LOW (ref 3.5–5.2)
Sodium: 140 mmol/L (ref 134–144)
Total Bilirubin: <0.2 mg/dL (ref 0.0–1.2)
Total Protein: 7 g/dL (ref 6.0–8.5)

## 2014-02-24 LAB — LIPID PANEL
Chol/HDL Ratio: 3.5 ratio units (ref 0.0–5.0)
Cholesterol, Total: 142 mg/dL (ref 100–199)
HDL: 41 mg/dL (ref >39)
LDL Calculated: 77 mg/dL (ref 0–99)
Triglycerides: 119 mg/dL (ref 0–149)
VLDL Cholesterol Cal: 24 mg/dL (ref 5–40)

## 2014-02-24 LAB — MICROALBUMIN / CREATININE URINE RATIO
Creatinine, Ur: 109 mg/dL (ref 22.0–328.0)
MICROALB/CREAT RATIO: <2.8 mg/g creat (ref 0.0–30.0)
Microalbumin, Urine: <3 ug/mL (ref 0.0–17.0)

## 2014-02-24 LAB — HEMOGLOBIN A1C
Est. average glucose Bld gHb Est-mCnc: 160 mg/dL
Hgb A1c MFr Bld: 7.2 % — ABNORMAL HIGH (ref 4.8–5.6)

## 2014-02-27 ENCOUNTER — Encounter: Payer: Self-pay | Admitting: Pharmacotherapy

## 2014-02-27 ENCOUNTER — Other Ambulatory Visit: Payer: Self-pay | Admitting: Internal Medicine

## 2014-02-27 ENCOUNTER — Ambulatory Visit (INDEPENDENT_AMBULATORY_CARE_PROVIDER_SITE_OTHER): Payer: Medicare Other | Admitting: Pharmacotherapy

## 2014-02-27 VITALS — BP 122/74 | HR 77 | Temp 98.1°F | Wt 323.0 lb

## 2014-02-27 DIAGNOSIS — IMO0001 Reserved for inherently not codable concepts without codable children: Secondary | ICD-10-CM | POA: Diagnosis not present

## 2014-02-27 DIAGNOSIS — D62 Acute posthemorrhagic anemia: Secondary | ICD-10-CM

## 2014-02-27 DIAGNOSIS — I10 Essential (primary) hypertension: Secondary | ICD-10-CM | POA: Diagnosis not present

## 2014-02-27 DIAGNOSIS — E1165 Type 2 diabetes mellitus with hyperglycemia: Secondary | ICD-10-CM

## 2014-02-27 MED ORDER — ANIMI-3 1 MG PO CAPS: 1.0000 | ORAL_CAPSULE | Freq: Every day | ORAL | Status: DC

## 2014-02-27 MED ORDER — INSULIN GLARGINE 100 UNIT/ML ~~LOC~~ SOLN: SUBCUTANEOUS | Status: DC

## 2014-02-27 MED ORDER — INSULIN LISPRO 100 UNIT/ML (KWIKPEN): PEN_INJECTOR | SUBCUTANEOUS | Status: DC

## 2014-02-27 NOTE — Progress Notes (Signed)
  Subjective:    Adrian Matthews is a 68 y.o.African American male who presents for follow-up of Type 2 diabetes mellitus.   Average BG:  130-140 range.  (91-194) Denies hypoglycemia. Trying to eat healthy.  He has cut portion sizes. He is going to the gym and water aerobics three times per week. Denies problems with feet. Denies problems with vision. Nocturia improved.  Only up twice per night.. Not as much polydipsia.   Review of Systems A comprehensive review of systems was negative except for: Genitourinary: positive for nocturia Endocrine: positive for diabetic symptoms including polydipsia    Objective:    BP 122/74  Pulse 77  Temp(Src) 98.1 F (36.7 C) (Oral)  Wt 323 lb (146.512 kg)  SpO2 98%  General:  alert, cooperative, appears stated age and no distress  Oropharynx: normal findings: lips normal without lesions and gums healthy   Eyes:  negative findings: lids and lashes normal and conjunctivae and sclerae normal   Ears:  external ears normal        Lung: clear to auscultation bilaterally  Heart:  regular rate and rhythm     Extremities: no edema  Skin: warm and dry, no hyperpigmentation, vitiligo, or suspicious lesions     Neuro: mental status, speech normal, alert and oriented x3 and gait and station normal   Lab Review Glucose (mg/dL)  Date Value  02/23/2014 87   10/27/2013 128*  06/30/2013 147*     Glucose, Bld (mg/dL)  Date Value  06/05/2013 112*  06/04/2013 93   02/17/2011 175*     CO2 (mmol/L)  Date Value  02/23/2014 25   10/27/2013 23   06/30/2013 24      BUN (mg/dL)  Date Value  02/23/2014 21   10/27/2013 19   06/30/2013 23   06/05/2013 14   06/04/2013 18   02/17/2011 25*     Creatinine (mg/dL)  Date Value  12/07/2009 1.87*  08/22/2009 1.85*     Creatinine, Ser (mg/dL)  Date Value  02/23/2014 1.44*  10/27/2013 1.55*  06/30/2013 1.46*    02/23/14: A1C:  7.2% AST:  18 ALT:  16 Total cholesterol:  142 Triglycerides:  119 HDL:   41 LDL:  77   Assessment:    Diabetes Mellitus type II, under good control.  BP at goal <140/90 LDL at goal <100   Plan:    1.  Rx changes: none 2.  Continue Lantus 55 units daily. 3   Continue Humalog 22 units with each meal, plus sliding scale. 4.  Counseled on nutrition goals. 5.  Praised exercise efforts.  Goal is 30-45 minutes 5 x week. 6.  HTN at goal. 7.  LDL at goal. 8.  RTC in 4 months - lab prior:  A1C, CMP

## 2014-02-27 NOTE — Patient Instructions (Signed)
Keep up the good work. Lantus 55 units daily. Humalog 22 units with meals.

## 2014-03-04 IMAGING — CR DG KNEE 1-2V*L*
3 series · 3 of 3 positions shown · non-contrast
Comparison: None

CLINICAL DATA: Left knee pain.

LEFT KNEE - 1-2 VIEW

[view not recorded (1 of 3)]
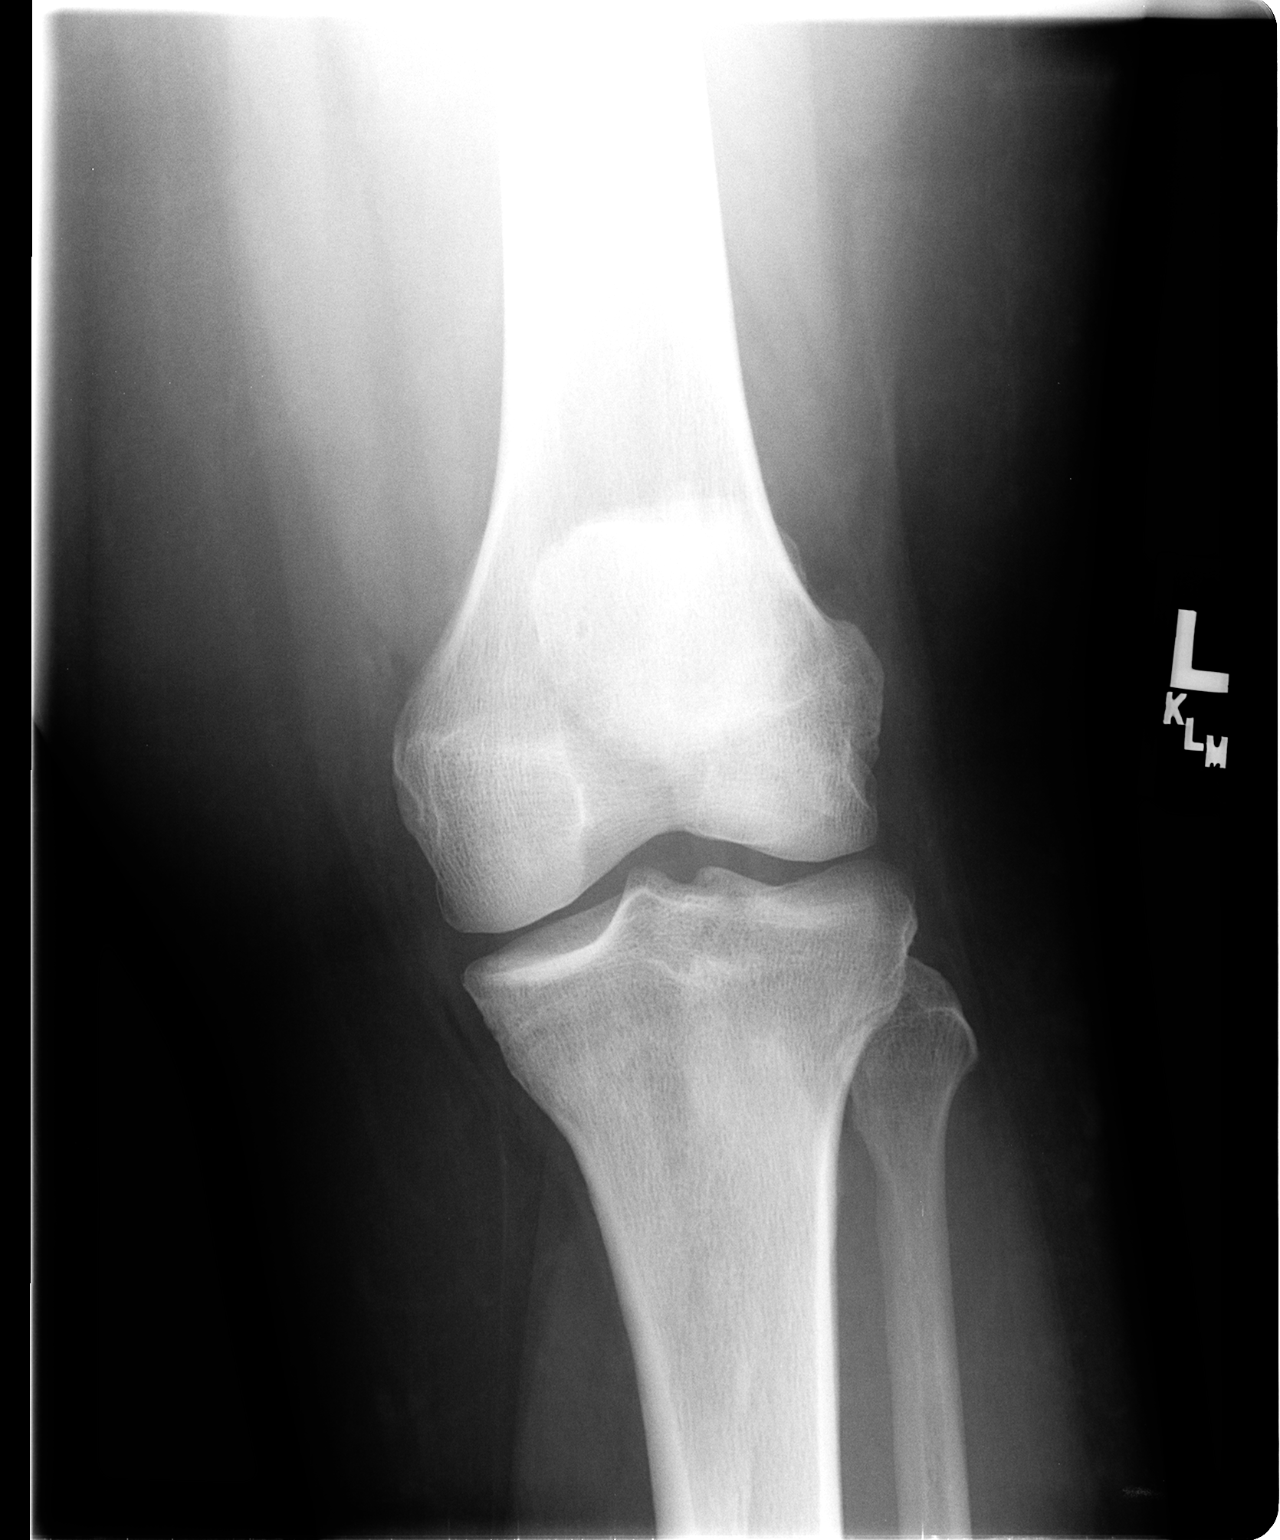

[view not recorded (2 of 3)]
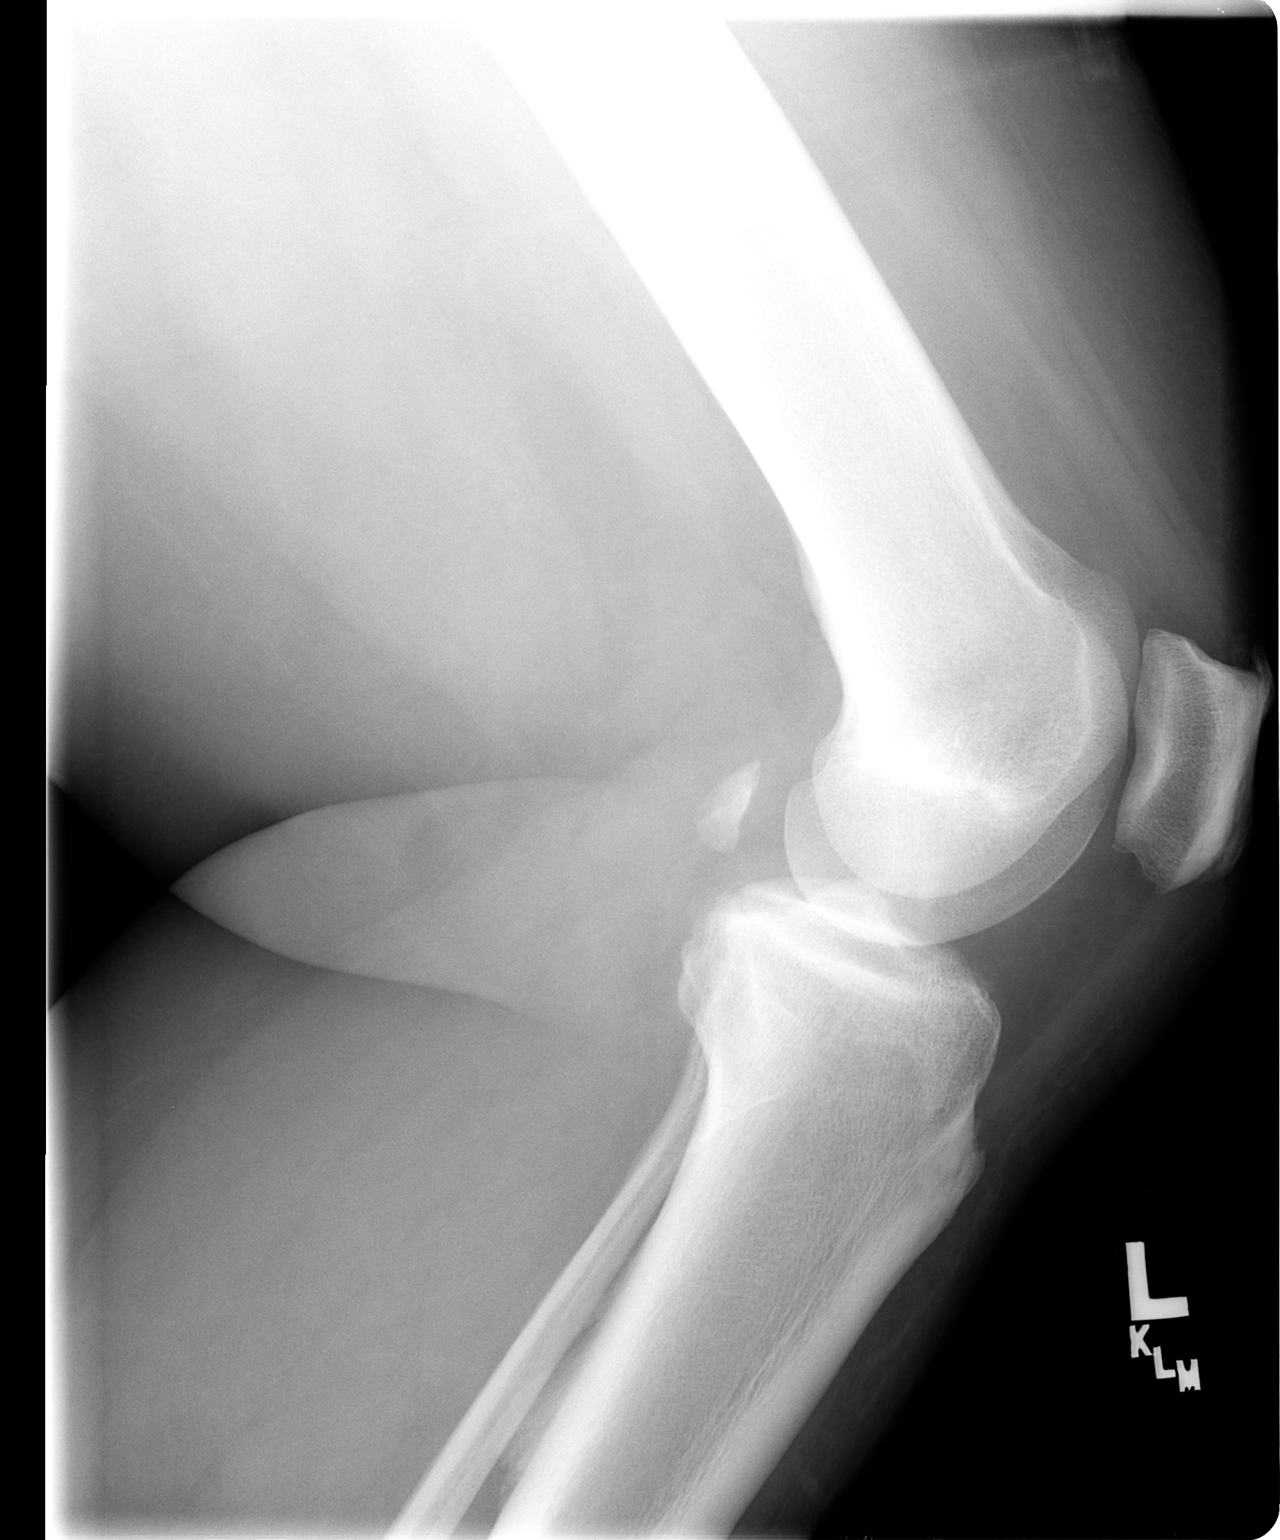

[view not recorded (3 of 3)]
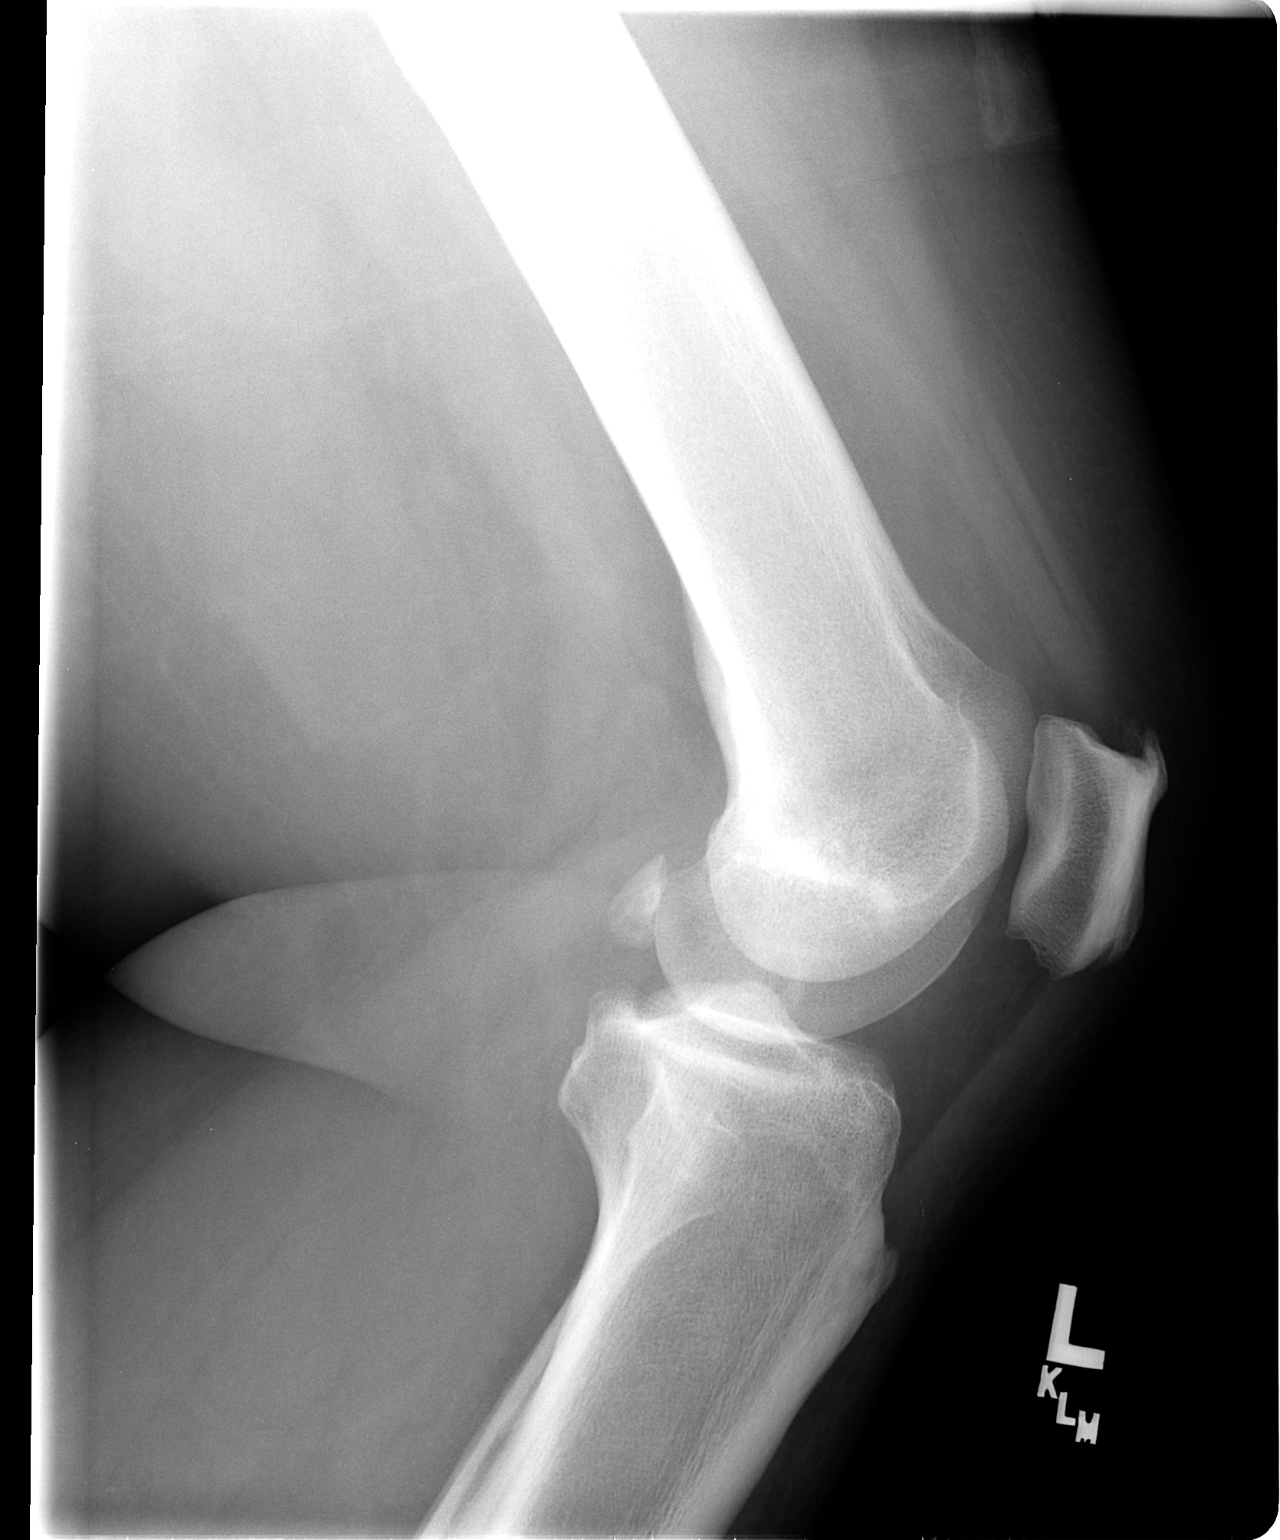

[3 of 3 positions shown; findings below may reference images not displayed]

FINDINGS: The joint spaces are maintained.  No acute fracture or
osteochondral abnormality.  No joint effusion.  Mild enthesopathic
changes involving the patella.
IMPRESSION: No acute bony findings or significant degenerative changes.

## 2014-03-18 ENCOUNTER — Other Ambulatory Visit: Payer: Self-pay | Admitting: Internal Medicine

## 2014-03-22 ENCOUNTER — Other Ambulatory Visit: Payer: Self-pay | Admitting: *Deleted

## 2014-03-22 DIAGNOSIS — IMO0001 Reserved for inherently not codable concepts without codable children: Secondary | ICD-10-CM

## 2014-03-22 DIAGNOSIS — E785 Hyperlipidemia, unspecified: Secondary | ICD-10-CM

## 2014-03-22 DIAGNOSIS — E1165 Type 2 diabetes mellitus with hyperglycemia: Secondary | ICD-10-CM

## 2014-03-22 MED ORDER — ROSUVASTATIN CALCIUM 20 MG PO TABS: ORAL_TABLET | ORAL | Status: DC

## 2014-03-22 MED ORDER — INSULIN GLARGINE 100 UNIT/ML ~~LOC~~ SOLN: SUBCUTANEOUS | Status: DC

## 2014-03-22 NOTE — Telephone Encounter (Signed)
Patient Requested 

## 2014-04-20 ENCOUNTER — Ambulatory Visit (INDEPENDENT_AMBULATORY_CARE_PROVIDER_SITE_OTHER): Payer: Medicare Other | Admitting: Internal Medicine

## 2014-04-20 ENCOUNTER — Encounter: Payer: Self-pay | Admitting: Internal Medicine

## 2014-04-20 VITALS — BP 128/80 | HR 84 | Temp 97.7°F | Wt 328.0 lb

## 2014-04-20 DIAGNOSIS — N183 Chronic kidney disease, stage 3 unspecified: Secondary | ICD-10-CM

## 2014-04-20 DIAGNOSIS — I10 Essential (primary) hypertension: Secondary | ICD-10-CM | POA: Diagnosis not present

## 2014-04-20 DIAGNOSIS — E1121 Type 2 diabetes mellitus with diabetic nephropathy: Secondary | ICD-10-CM

## 2014-04-20 DIAGNOSIS — E785 Hyperlipidemia, unspecified: Secondary | ICD-10-CM

## 2014-04-20 DIAGNOSIS — E1129 Type 2 diabetes mellitus with other diabetic kidney complication: Secondary | ICD-10-CM

## 2014-04-20 DIAGNOSIS — M1710 Unilateral primary osteoarthritis, unspecified knee: Secondary | ICD-10-CM

## 2014-04-20 DIAGNOSIS — N058 Unspecified nephritic syndrome with other morphologic changes: Secondary | ICD-10-CM

## 2014-04-20 DIAGNOSIS — IMO0002 Reserved for concepts with insufficient information to code with codable children: Secondary | ICD-10-CM

## 2014-04-20 DIAGNOSIS — E1165 Type 2 diabetes mellitus with hyperglycemia: Secondary | ICD-10-CM

## 2014-04-20 MED ORDER — ATORVASTATIN CALCIUM 40 MG PO TABS: 40.0000 mg | ORAL_TABLET | Freq: Every day | ORAL | Status: DC

## 2014-04-20 MED ORDER — GLUCOSE BLOOD VI STRP: ORAL_STRIP | Status: DC

## 2014-04-20 MED ORDER — FREESTYLE LANCETS MISC: Status: DC

## 2014-04-20 NOTE — Progress Notes (Signed)
Patient ID: Adrian Matthews, male   DOB: [birth year], 68 y.o.   MRN: [id number]   Location:  [facility]  Code Status: need to have advance directive discussion next visit  No Known Allergies  Chief Complaint  Patient presents with  . Medical Management of Chronic Issues    4 month follow-up   . Medication Management    Discuss changing Crestor (no longer covered by insurance)   . Referral    Referral request to foot specialist     HPI: Patient is a 68 y.o. black male seen in the office today for routine medical mgt of chronic diseases.  Needs different statin--crestor no longer covered.  Covers atorvastatin 40mg +, simvastatin 80 or gen caduet 40mg +.  Freestyle lite and lantus and test strips through new company.    Wants referral to podiatry.    Review of Systems:  Review of Systems  Constitutional: Negative for fever and malaise/fatigue.  HENT: Negative for congestion.   Eyes: Negative for blurred vision.  Respiratory: Negative for shortness of breath.   Cardiovascular: Negative for chest pain and leg swelling.  Gastrointestinal: Negative for abdominal pain, constipation, blood in stool and melena.  Genitourinary: Negative for dysuria, urgency and frequency.  Musculoskeletal: Positive for joint pain. Negative for falls and myalgias.       Better  Skin: Negative for rash.  Neurological: Negative for dizziness, loss of consciousness and weakness.  Psychiatric/Behavioral: Negative for depression and memory loss.     Past Medical History  Diagnosis Date  . Diabetes mellitus   . Hypertension   . Hyperlipidemia   . Osteoarthrosis, unspecified whether generalized or localized, lower leg   . Primary localized osteoarthrosis, unspecified site   . Asymptomatic varicose veins   . New daily persistent headache   . Unspecified hereditary and idiopathic peripheral neuropathy   . Chronic kidney disease   . Monoclonal paraproteinemia   .  Gout, unspecified   . Hypercalcemia   . Hyperosmolality and/or hypernatremia   . Diaphragmatic hernia without mention of obstruction or gangrene   . Unspecified disorder of kidney and ureter   . Other malaise and fatigue   . GERD (gastroesophageal reflux disease)     Past Surgical History  Procedure Laterality Date  . Carpal tunnel release  2007  . Colonoscopy N/A 06/06/2013    Procedure: COLONOSCOPY;  Surgeon: Cleotis Nipper, MD;  Location: [facility] ENDOSCOPY;  Service: Endoscopy;  Laterality: N/A;    Social History:   reports that he has never smoked. He does not have any smokeless tobacco history on file. He reports that he does not drink alcohol or use illicit drugs.  Family History  Problem Relation Age of Onset  . Hypertension Sister   . Hypertension Brother   . Diabetes Brother   . Hypertension Brother   . Hypertension Sister     Medications: Patient's Medications  New Prescriptions   No medications on file  Previous Medications   AMLODIPINE (NORVASC) 10 MG TABLET    TAKE 1 TABLET ONCE DAILY FOR BLOOD PRESSURE   CHLORTHALIDONE (HYGROTON) 25 MG TABLET    Take one tablet by mouth once daily for blood pressure.   INSULIN GLARGINE (LANTUS) 100 UNIT/ML INJECTION    Inject 55 units once day for diabetes Dx. 250.00   INSULIN LISPRO (HUMALOG KWIKPEN) 100 UNIT/ML KIWKPEN    Inject 22 units before each meal plus sliding scale.   LOSARTAN (COZAAR) 100 MG TABLET  Take 1 tablet (100 mg total) by mouth daily. For diabetic nephropathy   NEXIUM 40 MG CAPSULE    TAKE 1 CAPSULE DAILY   POTASSIUM CHLORIDE (K-DUR) 10 MEQ TABLET    Take one tablet by mouth twice daily for potassium   ROSUVASTATIN (CRESTOR) 20 MG TABLET    Take one tablet once daily for cholesterol.  Modified Medications   Modified Medication Previous Medication   GLUCOSE BLOOD (FREESTYLE LITE) TEST STRIP glucose blood (FREESTYLE LITE) test strip      Check blood sugar 3 times daily DX 250.00    Check blood sugar 3 times  daily DX 250.00   LANCETS (FREESTYLE) LANCETS Lancets (FREESTYLE) lancets      Check blood sugar 3 times daily DX: 250.00 (Freestyle Lite Machine)    Check blood sugar 3 times daily DX: 250.00 (Freestyle Lite Machine)  Discontinued Medications   FA-B6-B12-D-OMEGA 3-PHYTOSTER (ANIMI-3) 1 MG CAPS    Take 1 capsule (1 mg total) by mouth daily.   GLUCOSE BLOOD TEST STRIP    Use as instructed One Touch Ultra to test blood sugar three times daily     Physical Exam: Filed Vitals:   04/20/14 0808  BP: 128/80  Pulse: 84  Temp: 97.7 F (36.5 C)  TempSrc: Oral  Weight: 328 lb (148.78 kg)  SpO2: 99%  Physical Exam  Constitutional: He is oriented to person, place, and time. He appears well-developed and well-nourished. No distress.  Cardiovascular: Normal rate, regular rhythm, normal heart sounds and intact distal pulses.   Pulmonary/Chest: Effort normal and breath sounds normal. No respiratory distress.  Abdominal: Soft. Bowel sounds are normal. He exhibits no distension and no mass. There is no tenderness.  Musculoskeletal: Normal range of motion. He exhibits no edema and no tenderness.  Neurological: He is alert and oriented to person, place, and time.  Skin: Skin is warm and dry.  Psychiatric: He has a normal mood and affect.    Labs reviewed: Basic Metabolic Panel:  Recent Labs  06/30/13 0903 10/27/13 0822 02/23/14 0826  NA 139 140 140  K 4.3 3.5 3.2*  CL 99 96* 98  CO2 24 23 25   GLUCOSE 147* 128* 87  BUN 23 19 21   CREATININE 1.46* 1.55* 1.44*  CALCIUM 9.8 10.1 9.9   Liver Function Tests:  Recent Labs  06/04/13 1353 06/30/13 0903 10/27/13 0822 02/23/14 0826  AST 24 17 32 18  ALT 21 12 25 16   ALKPHOS 68 80 85 82  BILITOT 0.1* 0.2 0.3 <0.2  PROT 7.2 6.6 6.7 7.0  ALBUMIN 3.4*  --   --   --   CBC:  Recent Labs  06/04/13 1353  06/05/13 2030 06/06/13 0445 06/06/13 1204  WBC 9.9  < > 8.5 8.4 8.8  NEUTROABS 5.5  --   --   --   --   HGB 9.2*  < > 9.1* 8.9* 10.0*    HCT 27.8*  < > 27.9* 26.9* 30.5*  MCV 79.7  < > 80.2 79.8 80.1  PLT 211  < > 228 214 235  < > = values in this interval not displayed. Lipid Panel:  Recent Labs  06/30/13 0903 10/27/13 0822 02/23/14 0826  HDL 43 41 41  LDLCALC 71 87 77  TRIG 89 117 119  CHOLHDL 3.1 3.7 3.5   Lab Results  Component Value Date   HGBA1C 7.2* 02/23/2014   Assessment/Plan 1. Hyperlipidemia LDL goal <70 - requires change off crestor due to insurance - atorvastatin (  LIPITOR) 40 MG tablet; Take 1 tablet (40 mg total) by mouth daily.  Dispense: 90 tablet; Refill: 3 - Lipid panel; Future  2. DM (diabetes mellitus), type 2, uncontrolled, with renal complications - follows here with Tivis Ringer  - Lipid panel; Future - Ambulatory referral to Podiatry -foot exam done here  3. Unspecified essential hypertension -bp at goal with current meds--no changes -also encouraged his diet and exercise regimen  4. Osteoarthrosis, unspecified whether generalized or localized, lower leg -improved lately--did not c/o this today  5. Diabetic nephropathy associated with type 2 diabetes mellitus -renal function improved in May--has f/u labs upcoming before visit with Cathey  6. Chronic kidney disease, stage 3 -again kidneys stable -avoid nsaids  Labs/tests ordered:   Orders Placed This Encounter  Procedures  . Lipid panel    Standing Status: Future     Number of Occurrences:      Standing Expiration Date: 04/21/2015    Order Specific Question:  Has the patient fasted?    Answer:  Yes  . Ambulatory referral to Podiatry    Referral Priority:  Routine    Referral Type:  Consultation    Referral Reason:  Specialty Services Required    Requested Specialty:  Podiatry    Number of Visits Requested:  1    Next appt:  4 mos

## 2014-04-20 NOTE — Patient Instructions (Signed)
Fish oil 1000mg  daily for good cholesterol.  Try freezing the capsules which may help.

## 2014-05-09 ENCOUNTER — Other Ambulatory Visit: Payer: Self-pay | Admitting: Internal Medicine

## 2014-05-17 ENCOUNTER — Ambulatory Visit (INDEPENDENT_AMBULATORY_CARE_PROVIDER_SITE_OTHER): Payer: Medicare Other

## 2014-05-17 ENCOUNTER — Ambulatory Visit (INDEPENDENT_AMBULATORY_CARE_PROVIDER_SITE_OTHER): Payer: Medicare Other | Admitting: Podiatry

## 2014-05-17 DIAGNOSIS — B372 Candidiasis of skin and nail: Secondary | ICD-10-CM

## 2014-05-17 DIAGNOSIS — M2142 Flat foot [pes planus] (acquired), left foot: Principal | ICD-10-CM

## 2014-05-17 DIAGNOSIS — M7750 Other enthesopathy of unspecified foot: Secondary | ICD-10-CM

## 2014-05-17 DIAGNOSIS — M2141 Flat foot [pes planus] (acquired), right foot: Secondary | ICD-10-CM

## 2014-05-17 DIAGNOSIS — M2140 Flat foot [pes planus] (acquired), unspecified foot: Secondary | ICD-10-CM | POA: Diagnosis not present

## 2014-05-17 MED ORDER — TERBINAFINE HCL 250 MG PO TABS: 250.0000 mg | ORAL_TABLET | Freq: Every day | ORAL | Status: DC

## 2014-05-17 NOTE — Progress Notes (Signed)
   Subjective:    Patient ID: Adrian Matthews, male    DOB: 1946-04-01, 68 y.o.   MRN: [id number]  HPI Comments: "I have pain on the inside of my feet"  Patient c/o aching medial foot and ankle bilateral for several years. Does have swelling. Sometimes feels pins and needle sensations. Walking is uncomfortable. He also states that he get "water blisters" plantar left and sometimes it itches. Uses cream for that-some help.  Foot Pain Associated symptoms include arthralgias.      Review of Systems  Cardiovascular:       Calf pain with walking  Endocrine: Positive for cold intolerance and heat intolerance.  Genitourinary: Positive for urgency.  Musculoskeletal: Positive for arthralgias.  All other systems reviewed and are negative.      Objective:   Physical Exam        Assessment & Plan:

## 2014-05-17 NOTE — Progress Notes (Signed)
Subjective:     Patient ID: Adrian Matthews, male   DOB: 12/28/1945, 68 y.o.   MRN: [id number]  Foot Pain   patient presents stating I have a lot of swelling in my ankles been getting some water blisters on each side of my foot that it's she can make it hard for me to wear shoe gear and my feet get very tired. States it long-term diabetic who has had periods of not been in good control   Review of Systems  All other systems reviewed and are negative.      Objective:   Physical Exam  Nursing note and vitals reviewed. Constitutional: He is oriented to person, place, and time.  Cardiovascular: Intact distal pulses.   Musculoskeletal: Normal range of motion.  Neurological: He is oriented to person, place, and time.  Skin: Skin is warm.   neurovascular status intact with muscle strength adequate and range of motion subtalar midtarsal joint within normal limits. Collapse medial longitudinal arch was noted with moderate tendency towards inflammation of the posterior tibial and I did note diminishment of sharp dull and vibratory upon testing. 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 I recommended long-term diabetic shoes with custom insoles to give support to the plantar arch and hopefully prevent future breakdown and prescribed Lamisil 250 mg daily #45 to take for probable fungal infection reappoint for casting

## 2014-05-17 NOTE — Patient Instructions (Signed)
Flat Feet Having flat feet is a common condition. One foot or both might be affected. People of any age can have flat feet. In fact, everyone is born with them. But most of the time, the foot gradually develops an arch. That is the curve on the bottom of the foot that creates a gap between the foot and the ground. An arch usually develops in childhood. Sometimes, though, an arch never develops and the foot stays flat on the bottom. Other times, an arch develops but later collapses (caves in). That is what gives the condition its nickname, "fallen arches." The medical term for flat feet is pes planus. Some people have flat feet their whole life and have no problems. For others, the condition causes pain and needs to be corrected.  CAUSES   A problem with the foot's soft tissue; tendons and ligaments could be loose.  This can cause what is called flexible flat feet. That means the shape of the foot changes with pressure. When standing on the toes, a curved arch can be seen. When standing on the ground, the foot is flat.  Wear and tear. Sometimes arches simply flatten over time.  Damage to the posterior tibial tendon. This is the tendon that goes from the inside of the ankle to the bones in the middle of the foot. It is the main support for the arch. If the tendon is injured, stretched or torn, the arch might flatten.  Tarsal coalition. With this condition, two or more bones in the foot are joined together (fused ) during development in the womb. This limits movement and can lead to a flat foot. SYMPTOMS   The foot is even with the ground from toe to heel. Your caregiver will look closely at the inside of the foot while you are standing.  Pain along the bottom of the foot. Some people describe the pain as tightness.  Swelling on the inside of the foot or ankle.  Changes in the way you walk (gait).  The feet lean inward, starting at the ankle (pronation). DIAGNOSIS  To decide if a child or  adult has flat feet, a healthcare provider will probably:  Do a physical examination. This might include having the person stand on his or her toes and then stand normally. The caregiver will also hold the foot and put pressure on the foot in different directions.  Check the person's shoes. The pattern of wear on the soles can offer clues.  Order images (pictures) of the foot. They can help identify the cause of any pain. They also will show injuries to bones or tendons that could be causing the condition. The images can come from:  X-rays.  Computed tomography (CT) scan. This combines X-ray and a computer.  Magnetic resonance imaging (MRI). This uses magnets, radio waves and a computer to take a picture of the foot. It is the best technique to evaluate tendons, ligaments and muscles. TREATMENT   Flexible flat feet usually are painless. Most of the time, gait is not affected. Most children grow out of the condition. Often no treatment is needed. If there is pain, treatment options include:  Orthotics. These are inserts that go in the shoes. They add support and shape to the feet. An orthotic is custom-made from a mold of the foot.  Shoes. Not all shoes are the same. People with flat feet need arch support. However, too much can be painful. It is important to find shoes that offer the right amount   of support. Athletes, especially runners, may need to try shoes made just for people with flatter feet.  Medication. For pain, only take over-the-counter medicine for pain, discomfort, as directed by your caregiver.  Rest. If the feet start to hurt, cut back on the exercise which increases the pain. Use common sense.  For damage to the posterior tibial tendon, options include:  Orthotics. Also adding a wedge on the inside edge may help. This can relieve pressure on the tendon.  Ankle brace, boot or cast. These supports can ease the load on the tendon while it heals.  Surgery. If the tendon is  torn, it might need to be repaired.  For tarsal coalition, similar options apply:  Pain medication.  Orthotics.  A cast and crutches. This keeps weight off the foot.  Physical therapy.  Surgery to remove the bone bridge joining the two bones together. PROGNOSIS  In most people, flat feet do not cause pain or problems. People can go about their normal activities. However, if flat feet are painful, they can and should be treated. Treatment usually relieves the pain. HOME CARE INSTRUCTIONS   Take any medications prescribed by the healthcare provider. Follow the directions carefully.  Wear, or make sure a child wears, orthotics or special shoes if this was suggested. Be sure to ask how often and for how long they should be worn.  Do any exercises or therapy treatments that were suggested.  Take notes on when the pain occurs. This will help healthcare providers decide how to treat the condition.  If surgery is needed, be sure to find out if there is anything that should or should not be done before the operation. SEEK MEDICAL CARE IF:   Pain worsens in the foot or lower leg.  Pain disappears after treatment, but then returns.  Walking or simple exercise becomes difficult or causes foot pain.  Orthotics or special shoes are uncomfortable or painful. Document Released: 07/27/2009 Document Revised: 12/22/2011 Document Reviewed: 07/27/2009 Exit[employer] Patient Information 2015 [employer]. This information is not intended to replace advice given to you by your health care provider. Make sure you discuss any questions you have with your health care provider.  

## 2014-06-07 DIAGNOSIS — E119 Type 2 diabetes mellitus without complications: Secondary | ICD-10-CM | POA: Diagnosis not present

## 2014-06-07 LAB — HM DIABETES EYE EXAM

## 2014-06-14 DIAGNOSIS — N183 Chronic kidney disease, stage 3 unspecified: Secondary | ICD-10-CM | POA: Diagnosis not present

## 2014-06-14 DIAGNOSIS — D631 Anemia in chronic kidney disease: Secondary | ICD-10-CM | POA: Diagnosis not present

## 2014-06-14 DIAGNOSIS — Z8639 Personal history of other endocrine, nutritional and metabolic disease: Secondary | ICD-10-CM | POA: Diagnosis not present

## 2014-06-21 DIAGNOSIS — Z8639 Personal history of other endocrine, nutritional and metabolic disease: Secondary | ICD-10-CM | POA: Diagnosis not present

## 2014-06-21 DIAGNOSIS — I129 Hypertensive chronic kidney disease with stage 1 through stage 4 chronic kidney disease, or unspecified chronic kidney disease: Secondary | ICD-10-CM | POA: Diagnosis not present

## 2014-06-21 DIAGNOSIS — Z23 Encounter for immunization: Secondary | ICD-10-CM | POA: Diagnosis not present

## 2014-06-21 DIAGNOSIS — D631 Anemia in chronic kidney disease: Secondary | ICD-10-CM | POA: Diagnosis not present

## 2014-06-21 DIAGNOSIS — N183 Chronic kidney disease, stage 3 unspecified: Secondary | ICD-10-CM | POA: Diagnosis not present

## 2014-06-23 ENCOUNTER — Ambulatory Visit: Payer: Medicare Other

## 2014-06-23 DIAGNOSIS — E119 Type 2 diabetes mellitus without complications: Secondary | ICD-10-CM

## 2014-06-29 ENCOUNTER — Other Ambulatory Visit: Payer: Medicare Other

## 2014-06-29 DIAGNOSIS — IMO0001 Reserved for inherently not codable concepts without codable children: Secondary | ICD-10-CM | POA: Diagnosis not present

## 2014-06-29 DIAGNOSIS — E1129 Type 2 diabetes mellitus with other diabetic kidney complication: Secondary | ICD-10-CM | POA: Diagnosis not present

## 2014-06-29 DIAGNOSIS — E785 Hyperlipidemia, unspecified: Secondary | ICD-10-CM

## 2014-06-29 DIAGNOSIS — E1165 Type 2 diabetes mellitus with hyperglycemia: Secondary | ICD-10-CM

## 2014-06-29 DIAGNOSIS — IMO0002 Reserved for concepts with insufficient information to code with codable children: Secondary | ICD-10-CM

## 2014-06-30 LAB — COMPREHENSIVE METABOLIC PANEL
ALT: 14 IU/L (ref 0–44)
AST: 18 IU/L (ref 0–40)
Albumin/Globulin Ratio: 1.4 (ref 1.1–2.5)
Albumin: 3.9 g/dL (ref 3.6–4.8)
Alkaline Phosphatase: 85 IU/L (ref 39–117)
BUN/Creatinine Ratio: 13 (ref 10–22)
BUN: 18 mg/dL (ref 8–27)
CO2: 25 mmol/L (ref 18–29)
Calcium: 9.8 mg/dL (ref 8.6–10.2)
Chloride: 97 mmol/L (ref 97–108)
Creatinine, Ser: 1.38 mg/dL — ABNORMAL HIGH (ref 0.76–1.27)
GFR calc Af Amer: 61 mL/min/{1.73_m2} (ref >59)
GFR calc non Af Amer: 53 mL/min/{1.73_m2} — ABNORMAL LOW (ref >59)
Globulin, Total: 2.8 g/dL (ref 1.5–4.5)
Glucose: 87 mg/dL (ref 65–99)
Potassium: 3.6 mmol/L (ref 3.5–5.2)
Sodium: 141 mmol/L (ref 134–144)
Total Bilirubin: 0.2 mg/dL (ref 0.0–1.2)
Total Protein: 6.7 g/dL (ref 6.0–8.5)

## 2014-06-30 LAB — LIPID PANEL
Chol/HDL Ratio: 3.9 ratio units (ref 0.0–5.0)
Cholesterol, Total: 154 mg/dL (ref 100–199)
HDL: 39 mg/dL — ABNORMAL LOW (ref >39)
LDL Calculated: 90 mg/dL (ref 0–99)
Triglycerides: 127 mg/dL (ref 0–149)
VLDL Cholesterol Cal: 25 mg/dL (ref 5–40)

## 2014-06-30 LAB — HEMOGLOBIN A1C
Est. average glucose Bld gHb Est-mCnc: 177 mg/dL
Hgb A1c MFr Bld: 7.8 % — ABNORMAL HIGH (ref 4.8–5.6)

## 2014-07-03 ENCOUNTER — Encounter: Payer: Self-pay | Admitting: Internal Medicine

## 2014-07-03 ENCOUNTER — Encounter: Payer: Self-pay | Admitting: Pharmacotherapy

## 2014-07-03 ENCOUNTER — Ambulatory Visit (INDEPENDENT_AMBULATORY_CARE_PROVIDER_SITE_OTHER): Payer: Medicare Other | Admitting: Pharmacotherapy

## 2014-07-03 ENCOUNTER — Other Ambulatory Visit: Payer: Self-pay | Admitting: *Deleted

## 2014-07-03 VITALS — BP 134/78 | HR 86 | Temp 98.4°F | Resp 20 | Ht 71.0 in | Wt 325.8 lb

## 2014-07-03 DIAGNOSIS — E1129 Type 2 diabetes mellitus with other diabetic kidney complication: Secondary | ICD-10-CM

## 2014-07-03 DIAGNOSIS — N183 Chronic kidney disease, stage 3 unspecified: Secondary | ICD-10-CM | POA: Diagnosis not present

## 2014-07-03 DIAGNOSIS — I10 Essential (primary) hypertension: Secondary | ICD-10-CM

## 2014-07-03 DIAGNOSIS — E785 Hyperlipidemia, unspecified: Secondary | ICD-10-CM

## 2014-07-03 DIAGNOSIS — IMO0001 Reserved for inherently not codable concepts without codable children: Secondary | ICD-10-CM

## 2014-07-03 DIAGNOSIS — E1165 Type 2 diabetes mellitus with hyperglycemia: Secondary | ICD-10-CM | POA: Diagnosis not present

## 2014-07-03 DIAGNOSIS — IMO0002 Reserved for concepts with insufficient information to code with codable children: Secondary | ICD-10-CM

## 2014-07-03 MED ORDER — INSULIN PEN NEEDLE 32G X 4 MM MISC: Status: DC

## 2014-07-03 MED ORDER — INSULIN LISPRO 100 UNIT/ML (KWIKPEN): PEN_INJECTOR | SUBCUTANEOUS | Status: DC

## 2014-07-03 NOTE — Patient Instructions (Signed)
Increase Humalog to 22 units breakfast, 26 units lunch and supper.  Stay GOOD

## 2014-07-03 NOTE — Progress Notes (Signed)
  Subjective:    Adrian Matthews is a 68 y.o.African American male who presents for follow-up of Type 2 diabetes mellitus.   He has not been eating healthy choices.  Has been in [place] x 2 weeks. Walking and sight seeing for exercise. Now doing swimming and water aerobics 3 times per week. Denies problems with feet.  Saw Dr. Paulla Dolly.  Has been fitted for diabetic shoes. Denies problems with vision.  Had eye exam with Dr. Delman Cheadle last month. Not as much nocturia. Polydipsia present.  Fasting BG:  78-120 Post-prandial BG:  160-210 1 episode hypoglycemia (40m/dl)  Currently on Lantus 56 units daily Currently on Humalog 22 units with each meal.  Dr. PPosey Pronto(nephrologist) said he was stable.  Review of Systems A comprehensive review of systems was negative except for: Genitourinary: positive for nocturia Endocrine: positive for diabetic symptoms including polydipsia    Objective:    BP 134/78  Pulse 86  Temp(Src) 98.4 F (36.9 C) (Oral)  Resp 20  Ht 5' 11"$  (1.803 m)  Wt 325 lb 12.8 oz (147.782 kg)  BMI 45.46 kg/m2  SpO2 98%  General:  alert, cooperative and no distress  Oropharynx: normal findings: lips normal without lesions and gums healthy   Eyes:  negative findings: lids and lashes normal and conjunctivae and sclerae normal   Ears:  external ears normal        Lung: clear to auscultation bilaterally  Heart:  regular rate and rhythm     Extremities: edema - trace bilateral lower extremities  Skin: dry     Neuro: mental status, speech normal, alert and oriented x3 and gait and station normal   Lab Review Glucose (mg/dL)  Date Value  06/29/2014 87   02/23/2014 87   10/27/2013 128*     Glucose, Bld (mg/dL)  Date Value  06/05/2013 112*  06/04/2013 93   02/17/2011 175*     CO2 (mmol/L)  Date Value  06/29/2014 25   02/23/2014 25   10/27/2013 23      BUN (mg/dL)  Date Value  06/29/2014 18   02/23/2014 21   10/27/2013 19   06/05/2013 14   06/04/2013 18   02/17/2011 25*      Creatinine (mg/dL)  Date Value  12/07/2009 1.87*  08/22/2009 1.85*     Creatinine, Ser (mg/dL)  Date Value  06/29/2014 1.38*  02/23/2014 1.44*  10/27/2013 1.55*    06/29/14: A1C:  7.8% Total cholesterol:  154 Triglycerides:  127 HDL:  39 LDL:  90 AST:  18 ALT:  14  Assessment:    Diabetes Mellitus type II, under good control. A1C higher, but has been traveling. BP at goal <140/90 LDL at goal <100   Plan:    1.  Rx changes: Increase Humalog to 22 units breakfast, 26 units with lunch and supper 2.  Continue Lantus 56 units daily. 3.  Counseled on nutrition goals and meal planning. 4.  Counseled on benefit of routine exercise.  He plans to walk more.  Goal is 30-45 minutes 5 x week. 5.  Counseled on hyoglycemia. 6.  BP at goal <140/90.  Continue amlodipine, losartan, and chlorthalidone. 7.  LDL at goal on Lipitor 451mdaily.

## 2014-07-17 ENCOUNTER — Ambulatory Visit (INDEPENDENT_AMBULATORY_CARE_PROVIDER_SITE_OTHER): Payer: Medicare Other | Admitting: Podiatry

## 2014-07-17 ENCOUNTER — Encounter: Payer: Self-pay | Admitting: Podiatry

## 2014-07-17 DIAGNOSIS — E1129 Type 2 diabetes mellitus with other diabetic kidney complication: Secondary | ICD-10-CM

## 2014-07-17 DIAGNOSIS — M2141 Flat foot [pes planus] (acquired), right foot: Secondary | ICD-10-CM | POA: Diagnosis not present

## 2014-07-17 DIAGNOSIS — M2142 Flat foot [pes planus] (acquired), left foot: Secondary | ICD-10-CM

## 2014-07-17 NOTE — Patient Instructions (Signed)

## 2014-07-18 NOTE — Progress Notes (Signed)
Subjective:     Patient ID: Adrian Matthews, male   DOB: 07/01/1946, 68 y.o.   MRN: [id number]  HPI patient presents to pickup diabetic shoes with long time reduced circulatory status and are the and diabetes and flatfeet are noted   Review of Systems     Objective:   Physical Exam Neurovascular status unchanged and diminished from previous visits with significant structural malalignment of both feet and irritation    Assessment:     At risk diabetic with foot structural issues    Plan:     Reviewed condition and at this time dispensed diabetic shoes with instructions

## 2014-08-11 ENCOUNTER — Other Ambulatory Visit: Payer: Self-pay | Admitting: Internal Medicine

## 2014-08-14 ENCOUNTER — Other Ambulatory Visit: Payer: Medicare Other

## 2014-08-17 ENCOUNTER — Encounter: Payer: Self-pay | Admitting: Internal Medicine

## 2014-08-17 ENCOUNTER — Ambulatory Visit (INDEPENDENT_AMBULATORY_CARE_PROVIDER_SITE_OTHER): Payer: Medicare Other | Admitting: Internal Medicine

## 2014-08-17 VITALS — BP 132/80 | HR 85 | Resp 10 | Ht 70.5 in | Wt 326.0 lb

## 2014-08-17 DIAGNOSIS — E1165 Type 2 diabetes mellitus with hyperglycemia: Secondary | ICD-10-CM | POA: Diagnosis not present

## 2014-08-17 DIAGNOSIS — N183 Chronic kidney disease, stage 3 unspecified: Secondary | ICD-10-CM

## 2014-08-17 DIAGNOSIS — E785 Hyperlipidemia, unspecified: Secondary | ICD-10-CM | POA: Diagnosis not present

## 2014-08-17 DIAGNOSIS — E1121 Type 2 diabetes mellitus with diabetic nephropathy: Secondary | ICD-10-CM

## 2014-08-17 DIAGNOSIS — Z23 Encounter for immunization: Secondary | ICD-10-CM

## 2014-08-17 DIAGNOSIS — E1129 Type 2 diabetes mellitus with other diabetic kidney complication: Secondary | ICD-10-CM

## 2014-08-17 DIAGNOSIS — Z5181 Encounter for therapeutic drug level monitoring: Secondary | ICD-10-CM | POA: Diagnosis not present

## 2014-08-17 DIAGNOSIS — IMO0002 Reserved for concepts with insufficient information to code with codable children: Secondary | ICD-10-CM

## 2014-08-17 MED ORDER — INSULIN PEN NEEDLE 32G X 4 MM MISC: Status: DC

## 2014-08-17 NOTE — Progress Notes (Signed)
Patient ID: Adrian Matthews, male   DOB: Jul 23, 1946, 68 y.o.   MRN: [id number]   Location:  [facility] / [facility]  Code Status: full code;  Says he will bring his advance directive copy next time when he sees Cathey  No Known Allergies  Chief Complaint  Patient presents with  . Medical Management of Chronic Issues    4 month follow-up, labs completed on 06/29/2014  . Results    Discuss genetic test results     HPI: Patient is a 68 y.o. seen in the office today for here for med mgt chronic diseases.  bp good.  hba1c went up a little after he and his brother and some other folks went to [place] for a couple of weeks and ate what he wanted.  Medical sales representative.    Doing halfway good, but has craving for chocolate ice cream.  Trying to resist until after his appt.    Wearing his diabetic shoes.  Has f/u with them.  Reviewed normal genetic testing results with him.  Review of Systems:  Review of Systems  Constitutional: Negative for fever, chills, weight loss and malaise/fatigue.  HENT: Negative for hearing loss.   Eyes: Negative for blurred vision.  Respiratory: Negative for shortness of breath.   Cardiovascular: Negative for chest pain, palpitations and leg swelling.  Gastrointestinal: Negative for constipation, blood in stool and melena.  Genitourinary: Negative for dysuria.  Musculoskeletal: Negative for falls.  Neurological: Negative for dizziness, weakness and headaches.  Endo/Heme/Allergies: Does not bruise/bleed easily.  Psychiatric/Behavioral: Negative for depression and memory loss.     Past Medical History  Diagnosis Date  . Diabetes mellitus   . Hypertension   . Hyperlipidemia   . Osteoarthrosis, unspecified whether generalized or localized, lower leg   . Primary localized osteoarthrosis, unspecified site   . Asymptomatic varicose veins   . New daily persistent headache   . Unspecified hereditary and idiopathic peripheral  neuropathy   . Chronic kidney disease   . Monoclonal paraproteinemia   . Gout, unspecified   . Hypercalcemia   . Hyperosmolality and/or hypernatremia   . Diaphragmatic hernia without mention of obstruction or gangrene   . Unspecified disorder of kidney and ureter   . Other malaise and fatigue   . GERD (gastroesophageal reflux disease)     Past Surgical History  Procedure Laterality Date  . Carpal tunnel release  2007  . Colonoscopy N/A 06/06/2013    Procedure: COLONOSCOPY;  Surgeon: Cleotis Nipper, MD;  Location[facility] ENDOSCOPY;  Service: Endoscopy;  Laterality: N/A;    Social History:   reports that he has never smoked. He does not have any smokeless tobacco history on file. He reports that he does not drink alcohol or use illicit drugs.  Family History  Problem Relation Age of Onset  . Hypertension Sister   . Hypertension Brother   . Diabetes Brother   . Hypertension Brother   . Hypertension Sister     Medications: Patient's Medications  New Prescriptions   No medications on file  Previous Medications   AMLODIPINE (NORVASC) 10 MG TABLET    TAKE 1 TABLET DAILY FOR BLOOD PRESSURE   ATORVASTATIN (LIPITOR) 40 MG TABLET    Take 1 tablet (40 mg total) by mouth daily.   CHLORTHALIDONE (HYGROTON) 25 MG TABLET    Take one tablet by mouth once daily for blood pressure.   GLUCOSE BLOOD (FREESTYLE LITE) TEST STRIP    Check blood sugar 3  times daily DX 250.00   INSULIN GLARGINE (LANTUS) 100 UNIT/ML INJECTION    Inject 55 units once day for diabetes Dx. 250.00   INSULIN LISPRO (HUMALOG KWIKPEN) 100 UNIT/ML KIWKPEN    Inject 22 units before breakfast, 26 units before lunch & supper - plus sliding scale.   LANCETS (FREESTYLE) LANCETS    Check blood sugar 3 times daily DX: 250.00 (Freestyle Lite Machine)   LOSARTAN (COZAAR) 100 MG TABLET    Take 1 tablet (100 mg total) by mouth daily. For diabetic nephropathy   NEXIUM 40 MG CAPSULE    TAKE 1 CAPSULE DAILY   POTASSIUM CHLORIDE (K-DUR) 10  MEQ TABLET    Take one tablet by mouth twice daily for potassium   TERBINAFINE (LAMISIL) 250 MG TABLET    Take 1 tablet (250 mg total) by mouth daily.  Modified Medications   Modified Medication Previous Medication   INSULIN PEN NEEDLE 32G X 4 MM MISC Insulin Pen Needle 32G X 4 MM MISC      22-26 units three times daily before meals.    22-26 units three times daily before meals.  Discontinued Medications   No medications on file   Physical Exam: Filed Vitals:   08/17/14 0837  BP: 132/80  Pulse: 85  Resp: 10  Height: 5' 10.5" (1.791 m)  Weight: 326 lb (147.873 kg)  SpO2: 97%  Physical Exam  Constitutional: He is oriented to person, place, and time. He appears well-developed and well-nourished. No distress.  Morbidly obese black male very pleasant  Cardiovascular: Normal rate, regular rhythm, normal heart sounds and intact distal pulses.   Pulmonary/Chest: Effort normal and breath sounds normal. No respiratory distress.  Abdominal: Soft. Bowel sounds are normal. He exhibits no distension and no mass. There is no tenderness.  Musculoskeletal: Normal range of motion.  Neurological: He is alert and oriented to person, place, and time.  Skin: Skin is warm and dry.    Labs reviewed: Basic Metabolic Panel:  Recent Labs  10/27/13 0822 02/23/14 0826 06/29/14 0821  NA 140 140 141  K 3.5 3.2* 3.6  CL 96* 98 97  CO2 23 25 25   GLUCOSE 128* 87 87  BUN 19 21 18   CREATININE 1.55* 1.44* 1.38*  CALCIUM 10.1 9.9 9.8   Liver Function Tests:  Recent Labs  10/27/13 0822 02/23/14 0826 06/29/14 0821  AST 32 18 18  ALT 25 16 14   ALKPHOS 85 82 85  BILITOT 0.3 <0.2 0.2  PROT 6.7 7.0 6.7  Lipid Panel:  Recent Labs  10/27/13 0822 02/23/14 0826 06/29/14 0821  HDL 41 41 39*  LDLCALC 87 77 90  TRIG 117 119 127  CHOLHDL 3.7 3.5 3.9   Lab Results  Component Value Date   HGBA1C 7.8* 06/29/2014   Assessment/Plan 1. DM (diabetes mellitus), type 2, uncontrolled, with renal  complications -keep f/u with Tivis Ringer -he did not eat right when he went to [place] for 2 wks -says he is doing things right now--didn't buy his favorite cookies or chocolate ice cream  2. Chronic kidney disease, stage 3 -cont f/u with Dr. Posey Pronto about this -cont losartan  3. Hyperlipidemia LDL goal <70 -lipids at goal, but has low HDL -is eating salmon patties which should help  4. Diabetic nephropathy associated with type 2 diabetes mellitus -cont lantus with humalog meal coverage, ARB, statin -not on blood thinners--had episode of rectal bleeding  5. Obesity, Class III, BMI 40-49.9 (morbid obesity) -says he is not going to  drop any huge amts of weight -continues to try to eat right and go to the gym  6. Need for vaccination with 13-polyvalent pneumococcal conjugate vaccine -prevnar given today  7. Encounter for therapeutic drug monitoring -reviewed his normal genetic testing results  Labs/tests ordered:  Has labs before his visit with Cathey  Next appt:  4 mos  Adrian Matthews, D.O. [facility] [address], [place] [address] Cell Phone (Mon-Fri 8am-5pm):  [phone] On Call:  [phone] & follow prompts after 5pm & weekends Office Phone:  [phone] Office Fax:  [phone]

## 2014-08-18 NOTE — Addendum Note (Signed)
Addended by: Logan Bores on: 08/18/2014 01:43 PM   Modules accepted: Orders

## 2014-08-24 ENCOUNTER — Other Ambulatory Visit: Payer: Self-pay | Admitting: *Deleted

## 2014-08-24 ENCOUNTER — Encounter: Payer: Self-pay | Admitting: *Deleted

## 2014-08-24 MED ORDER — INSULIN PEN NEEDLE 32G X 4 MM MISC: Status: DC

## 2014-08-24 NOTE — Telephone Encounter (Signed)
Patient called and stated that he needed another Rx faxed to [facility] because he uses the needles four times daily and they only sent him a box of 100. Refaxed Rx with correct #

## 2014-09-21 ENCOUNTER — Other Ambulatory Visit: Payer: Medicare Other

## 2014-09-21 ENCOUNTER — Other Ambulatory Visit: Payer: Self-pay | Admitting: *Deleted

## 2014-09-21 DIAGNOSIS — E1165 Type 2 diabetes mellitus with hyperglycemia: Principal | ICD-10-CM

## 2014-09-21 DIAGNOSIS — IMO0002 Reserved for concepts with insufficient information to code with codable children: Secondary | ICD-10-CM

## 2014-09-21 DIAGNOSIS — E1129 Type 2 diabetes mellitus with other diabetic kidney complication: Secondary | ICD-10-CM

## 2014-09-21 DIAGNOSIS — E119 Type 2 diabetes mellitus without complications: Secondary | ICD-10-CM

## 2014-09-21 MED ORDER — INSULIN LISPRO 100 UNIT/ML (KWIKPEN): PEN_INJECTOR | SUBCUTANEOUS | Status: DC

## 2014-09-22 LAB — COMPREHENSIVE METABOLIC PANEL
ALT: 17 IU/L (ref 0–44)
AST: 21 IU/L (ref 0–40)
Albumin/Globulin Ratio: 1.3 (ref 1.1–2.5)
Albumin: 3.9 g/dL (ref 3.6–4.8)
Alkaline Phosphatase: 76 IU/L (ref 39–117)
BUN/Creatinine Ratio: 12 (ref 10–22)
BUN: 18 mg/dL (ref 8–27)
CO2: 28 mmol/L (ref 18–29)
Calcium: 9.9 mg/dL (ref 8.6–10.2)
Chloride: 96 mmol/L — ABNORMAL LOW (ref 97–108)
Creatinine, Ser: 1.46 mg/dL — ABNORMAL HIGH (ref 0.76–1.27)
GFR calc Af Amer: 57 mL/min/{1.73_m2} — ABNORMAL LOW (ref >59)
GFR calc non Af Amer: 49 mL/min/{1.73_m2} — ABNORMAL LOW (ref >59)
Globulin, Total: 3 g/dL (ref 1.5–4.5)
Glucose: 146 mg/dL — ABNORMAL HIGH (ref 65–99)
Potassium: 3.5 mmol/L (ref 3.5–5.2)
Sodium: 139 mmol/L (ref 134–144)
Total Bilirubin: 0.3 mg/dL (ref 0.0–1.2)
Total Protein: 6.9 g/dL (ref 6.0–8.5)

## 2014-09-22 LAB — HEMOGLOBIN A1C
Est. average glucose Bld gHb Est-mCnc: 171 mg/dL
Hgb A1c MFr Bld: 7.6 % — ABNORMAL HIGH (ref 4.8–5.6)

## 2014-09-25 ENCOUNTER — Ambulatory Visit (INDEPENDENT_AMBULATORY_CARE_PROVIDER_SITE_OTHER): Payer: Medicare Other | Admitting: Pharmacotherapy

## 2014-09-25 ENCOUNTER — Encounter: Payer: Self-pay | Admitting: Pharmacotherapy

## 2014-09-25 VITALS — BP 136/84 | HR 79 | Temp 97.6°F | Resp 10 | Ht 71.0 in | Wt 323.0 lb

## 2014-09-25 DIAGNOSIS — N183 Chronic kidney disease, stage 3 unspecified: Secondary | ICD-10-CM

## 2014-09-25 DIAGNOSIS — I10 Essential (primary) hypertension: Secondary | ICD-10-CM

## 2014-09-25 DIAGNOSIS — E1129 Type 2 diabetes mellitus with other diabetic kidney complication: Secondary | ICD-10-CM | POA: Diagnosis not present

## 2014-09-25 DIAGNOSIS — IMO0002 Reserved for concepts with insufficient information to code with codable children: Secondary | ICD-10-CM

## 2014-09-25 DIAGNOSIS — E1165 Type 2 diabetes mellitus with hyperglycemia: Secondary | ICD-10-CM | POA: Diagnosis not present

## 2014-09-25 MED ORDER — INSULIN GLARGINE 100 UNIT/ML SOLOSTAR PEN: 60.0000 [IU] | PEN_INJECTOR | Freq: Every day | SUBCUTANEOUS | Status: DC

## 2014-09-25 NOTE — Progress Notes (Signed)
  Subjective:    Adrian Matthews is a 68 y.o.African American male who presents for follow-up of Type 2 diabetes mellitus.   Most recent A1C is 7.6%. Forgot to bring blood glucose meter. Self reports some BG in the 200's.   3 or 4 hypoglycemia (in the 60's) He thinks average BG is 128m/dl  Denies problems with feet. Some tingling in feet when it is cold.  Checks feet daily. Denies problems with vision. Nocturia at times. Polydipsia noted.  Some polyuria. Denies peripheral edema.  He is trying to make healthy food choices.  Grilling more. [facility]  Swims and cardio machines.  Review of Systems A comprehensive review of systems was negative except for: Genitourinary: positive for nocturia Neurological: positive for some peripheral neuropathy Endocrine: positive for diabetic symptoms including polydipsia and polyuria    Objective:    BP 136/84 mmHg  Pulse 79  Temp(Src) 97.6 F (36.4 C) (Oral)  Resp 10  Ht 5' 11"$  (1.803 m)  Wt 323 lb (146.512 kg)  BMI 45.07 kg/m2  SpO2 99%  General:  alert, cooperative and no distress  Oropharynx: normal findings: lips normal without lesions and gums healthy   Eyes:  negative findings: lids and lashes normal and conjunctivae and sclerae normal   Ears:  external ears normal        Lung: clear to auscultation bilaterally  Heart:  regular rate and rhythm     Extremities: no edema  Skin: warm and dry, no hyperpigmentation, vitiligo, or suspicious lesions     Neuro: mental status, speech normal, alert and oriented x3 and gait and station normal   Lab Review GLUCOSE (mg/dL)  Date Value  09/21/2014 146*  06/29/2014 87  02/23/2014 87   GLUCOSE, BLD (mg/dL)  Date Value  06/05/2013 112*  06/04/2013 93  02/17/2011 175*   CO2 (mmol/L)  Date Value  09/21/2014 28  06/29/2014 25  02/23/2014 25   BUN (mg/dL)  Date Value  09/21/2014 18  06/29/2014 18  02/23/2014 21  06/05/2013 14  06/04/2013 18   02/17/2011 25*   CREATININE (mg/dL)  Date Value  12/07/2009 1.87*  08/22/2009 1.85*   CREATININE, SER (mg/dL)  Date Value  09/21/2014 1.46*  06/29/2014 1.38*  02/23/2014 1.44*     09/21/14: A1C: 7.6%   Assessment:    Diabetes Mellitus type II, under good control. A1C improved. BP at goal <140/90   Plan:    1.  Rx changes: increase Lantus 60 units daily 2.  Continue Humalog 22 units with breakfast, 26 units with lunch and supper. 3.  Counseled on nutrition goals. 4.  Praised exercise efforts. 5.  BP at goal <140/90 6.  Kidney function stable. 7.  RTC in 3 months.  Lab prior:  A1C, CMP, microalbumin, FLP.

## 2014-09-25 NOTE — Patient Instructions (Signed)
Increase Lantus 60 units daily

## 2014-10-17 ENCOUNTER — Ambulatory Visit (INDEPENDENT_AMBULATORY_CARE_PROVIDER_SITE_OTHER): Payer: Medicare Other

## 2014-10-17 ENCOUNTER — Ambulatory Visit (INDEPENDENT_AMBULATORY_CARE_PROVIDER_SITE_OTHER): Payer: Medicare Other | Admitting: Podiatry

## 2014-10-17 ENCOUNTER — Encounter: Payer: Self-pay | Admitting: Podiatry

## 2014-10-17 VITALS — BP 105/61 | HR 58 | Temp 97.4°F | Resp 14

## 2014-10-17 DIAGNOSIS — M1A1710 Lead-induced chronic gout, right ankle and foot, without tophus (tophi): Secondary | ICD-10-CM

## 2014-10-17 DIAGNOSIS — S90121A Contusion of right lesser toe(s) without damage to nail, initial encounter: Secondary | ICD-10-CM | POA: Diagnosis not present

## 2014-10-17 DIAGNOSIS — T560X1A Toxic effect of lead and its compounds, accidental (unintentional), initial encounter: Secondary | ICD-10-CM | POA: Diagnosis not present

## 2014-10-17 DIAGNOSIS — M779 Enthesopathy, unspecified: Secondary | ICD-10-CM

## 2014-10-17 MED ORDER — TRIAMCINOLONE ACETONIDE 10 MG/ML IJ SUSP
10.0000 mg | Freq: Once | INTRAMUSCULAR | Status: AC
  Administered 2014-10-17: 10 mg

## 2014-10-17 NOTE — Progress Notes (Signed)
Subjective:     Patient ID: Adrian Matthews, male   DOB: [birth year], 69 y.o.   MRN: [id number]  HPI patient states he's had a lot of pain in his big toe joint for about the last week and he's not sure of what may have occurred. He is a diabetic but is under very good control with his A1c running about 6.5   Review of Systems  All other systems reviewed and are negative.      Objective:   Physical Exam  Constitutional: He is oriented to person, place, and time.  Cardiovascular: Intact distal pulses.   Musculoskeletal: Normal range of motion.  Neurological: He is oriented to person, place, and time.  Skin: Skin is warm and dry.  Nursing note and vitals reviewed.  neurovascular status was found to be diminished in the PT pulses bilateral with DP within normal limits and patient is noted to have good digital perfusion bilateral. Diminished range of motion of the subtalar midtarsal joint was noted and the patient does have moderate forefoot edema bilateral secondary to obesity and other factors. There is quite a bit of discomfort around the first MPJ right with inflammation and redness noted     Assessment:     Possibility for inflammatory capsulitis versus gout symptomatology right first MPJ    Plan:     H&P and x-rays reviewed. At this time I did a careful injection of the right first MPJ 3 mg Kenalog 5 mg Xylocaine and advised patient on soaks. If symptoms persist or any other issues were to occur he is to let us know right away

## 2014-10-17 NOTE — Progress Notes (Signed)
   Subjective:    Patient ID: Adrian Matthews, male    DOB: [birth year], 69 y.o.   MRN: [id number]  HPI Comments: Pt states he began to have pain and swelling in the right 1st toe at the toe base, on New Years Day and has been treating with Tylenol Extra Strength only.  Pt denies any injury.  Toe Pain       Review of Systems  All other systems reviewed and are negative.      Objective:   Physical Exam        Assessment & Plan:

## 2014-10-19 ENCOUNTER — Ambulatory Visit: Payer: Medicare Other | Admitting: Nurse Practitioner

## 2014-10-27 ENCOUNTER — Encounter: Payer: Self-pay | Admitting: Internal Medicine

## 2014-10-27 ENCOUNTER — Ambulatory Visit (INDEPENDENT_AMBULATORY_CARE_PROVIDER_SITE_OTHER): Payer: Medicare Other | Admitting: Internal Medicine

## 2014-10-27 ENCOUNTER — Ambulatory Visit
Admission: RE | Admit: 2014-10-27 | Discharge: 2014-10-27 | Disposition: A | Discharge status: Home / Self Care | Payer: Medicare Other | Source: Ambulatory Visit | Attending: Internal Medicine | Admitting: Internal Medicine

## 2014-10-27 VITALS — BP 132/80 | HR 81 | Temp 97.8°F | Resp 10

## 2014-10-27 DIAGNOSIS — M545 Low back pain, unspecified: Secondary | ICD-10-CM

## 2014-10-27 DIAGNOSIS — M5137 Other intervertebral disc degeneration, lumbosacral region: Secondary | ICD-10-CM | POA: Diagnosis not present

## 2014-10-27 DIAGNOSIS — E1142 Type 2 diabetes mellitus with diabetic polyneuropathy: Secondary | ICD-10-CM

## 2014-10-27 DIAGNOSIS — E1121 Type 2 diabetes mellitus with diabetic nephropathy: Secondary | ICD-10-CM | POA: Diagnosis not present

## 2014-10-27 DIAGNOSIS — M1990 Unspecified osteoarthritis, unspecified site: Secondary | ICD-10-CM | POA: Diagnosis not present

## 2014-10-27 MED ORDER — TRAMADOL HCL 50 MG PO TABS: 50.0000 mg | ORAL_TABLET | Freq: Three times a day (TID) | ORAL | Status: DC | PRN

## 2014-10-27 NOTE — Progress Notes (Signed)
Patient ID: Adrian Matthews, male   DOB: [birth year], 69 y.o.   MRN: [id number]    Facility  [facility]    Place of Service:   OFFICE   No Known Allergies  Chief Complaint  Patient presents with  . Acute Visit    Aching joints x 1 week     HPI:  69 yo male c/o 1 week hx back pain. Hx arthritis, gout and diabetic neuropathy. No loss of bowel/bladder control. No radiculopathy. Tried Tylenol ES without relief. Pain is 4/10 on scale. Heating pad helps. Pain worsens with standing from seated position after sitting for prolonged time. No recent trauma. He usually swims 3-4 times per week but unable to perform usual regimen due to pain. Also has right foot and great toe swelling with pain  Medications: Patient's Medications  New Prescriptions   No medications on file  Previous Medications   AMLODIPINE (NORVASC) 10 MG TABLET    TAKE 1 TABLET DAILY FOR BLOOD PRESSURE   ATORVASTATIN (LIPITOR) 40 MG TABLET    Take 1 tablet (40 mg total) by mouth daily.   CHLORTHALIDONE (HYGROTON) 25 MG TABLET    Take one tablet by mouth once daily for blood pressure.   GLUCOSE BLOOD (FREESTYLE LITE) TEST STRIP    Check blood sugar 3 times daily DX 250.00   INSULIN GLARGINE (LANTUS SOLOSTAR) 100 UNIT/ML SOLOSTAR PEN    Inject 60 Units into the skin daily at 10 pm.   INSULIN LISPRO (HUMALOG KWIKPEN) 100 UNIT/ML KIWKPEN    Inject 22 units before breakfast, 26 units before lunch & supper - plus sliding scale.   INSULIN PEN NEEDLE 32G X 4 MM MISC    22-26 units four times daily before meals.Dx: E11.9   LANCETS (FREESTYLE) LANCETS    Check blood sugar 3 times daily DX: 250.00 (Freestyle Lite Machine)   LOSARTAN (COZAAR) 100 MG TABLET    Take 1 tablet (100 mg total) by mouth daily. For diabetic nephropathy   NEXIUM 40 MG CAPSULE    TAKE 1 CAPSULE DAILY   POTASSIUM CHLORIDE (K-DUR) 10 MEQ TABLET    Take one tablet by mouth twice daily for potassium   TERBINAFINE (LAMISIL) 250 MG TABLET    Take 1 tablet (250 mg total) by mouth  daily.  Modified Medications   No medications on file  Discontinued Medications   No medications on file     Review of Systems  As above. All other systems reviewed are negative  Filed Vitals:   10/27/14 0822  BP: 132/80  Pulse: 81  Temp: 97.8 F (36.6 C)  TempSrc: Oral  Resp: 10  SpO2: 99%   There is no weight on file to calculate BMI.  Physical Exam  Musculoskeletal:       Back:  Increased lumbar lordosis with paravertebral R>L muscle hypertrophy and ropy tissue texture changes; right greater trochanteric TTP with iliotibial band hypertrophy and TTP; FROM right hip; neg SLR; antalgic gait; right ankle swelling with medial malleolus TTP and reduced ankle ROM; no calf TTP; right knee swelling     Labs reviewed: Appointment on 09/21/2014  Component Date Value Ref Range Status  . Hgb A1c MFr Bld 09/21/2014 7.6* 4.8 - 5.6 % Final   Comment:          Pre-diabetes: 5.7 - 6.4          Diabetes: >6.4          Glycemic control for adults with diabetes: <7.0   .  Est. average glucose Bld gHb Est-m* 09/21/2014 171   Final  . Glucose 09/21/2014 146* 65 - 99 mg/dL Final  . BUN 09/21/2014 18  8 - 27 mg/dL Final  . Creatinine, Ser 09/21/2014 1.46* 0.76 - 1.27 mg/dL Final  . GFR calc non Af Amer 09/21/2014 49* >59 mL/min/1.73 Final  . GFR calc Af Amer 09/21/2014 57* >59 mL/min/1.73 Final  . BUN/Creatinine Ratio 09/21/2014 12  10 - 22 Final  . Sodium 09/21/2014 139  134 - 144 mmol/L Final  . Potassium 09/21/2014 3.5  3.5 - 5.2 mmol/L Final  . Chloride 09/21/2014 96* 97 - 108 mmol/L Final  . CO2 09/21/2014 28  18 - 29 mmol/L Final  . Calcium 09/21/2014 9.9  8.6 - 10.2 mg/dL Final  . Total Protein 09/21/2014 6.9  6.0 - 8.5 g/dL Final  . Albumin 09/21/2014 3.9  3.6 - 4.8 g/dL Final  . Globulin, Total 09/21/2014 3.0  1.5 - 4.5 g/dL Final  . Albumin/Globulin Ratio 09/21/2014 1.3  1.1 - 2.5 Final  . Total Bilirubin 09/21/2014 0.3  0.0 - 1.2 mg/dL Final  . Alkaline Phosphatase  09/21/2014 76  39 - 117 IU/L Final  . AST 09/21/2014 21  0 - 40 IU/L Final  . ALT 09/21/2014 17  0 - 44 IU/L Final     Assessment/Plan   ICD-9-CM ICD-10-CM   1. Right-sided low back pain without sciatica 724.2 M54.5 DG Lumbar Spine Complete  2. Arthritis 716.90 M19.90   3. 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 Diabetic polyneuropathy associated with type 2 diabetes mellitus 250.60 E11.42    357.2     --Rx tramadol for moderate-severe pain to use prn. Take with food  --no heavy lifting >10lbs until pain improves  --heating pad prn  --continue tylenol ES prn  --may need ortho eval if pain persists or worsens  --will call with L spine xray results  Keep scheduled appt with Dr Lorelei Pont S. Perlie Gold  [facility] [address] [place], [place] [address] [phone] Office (Wednesdays and Fridays 8 AM - 5 PM) [phone] Cell (Monday-Friday 8 AM - 5 PM)

## 2014-10-27 NOTE — Patient Instructions (Signed)
Continue heating pad as needed to lower backj  No heavy lifting >10 lbs until back pain improved  May need ortho evaluation if pain does not improve.  Continue Tylenol ES as needed for pain  Take tramadol with food

## 2014-11-06 ENCOUNTER — Encounter: Payer: Self-pay | Admitting: Podiatry

## 2014-11-06 ENCOUNTER — Other Ambulatory Visit: Payer: Self-pay | Admitting: Internal Medicine

## 2014-11-06 ENCOUNTER — Ambulatory Visit: Payer: Medicare Other | Admitting: Podiatry

## 2014-11-06 ENCOUNTER — Ambulatory Visit (INDEPENDENT_AMBULATORY_CARE_PROVIDER_SITE_OTHER): Payer: Medicare Other | Admitting: Podiatry

## 2014-11-06 VITALS — BP 145/78 | HR 96 | Resp 16

## 2014-11-06 DIAGNOSIS — M779 Enthesopathy, unspecified: Secondary | ICD-10-CM

## 2014-11-06 DIAGNOSIS — S90121A Contusion of right lesser toe(s) without damage to nail, initial encounter: Secondary | ICD-10-CM

## 2014-11-06 MED ORDER — TRIAMCINOLONE ACETONIDE 10 MG/ML IJ SUSP
10.0000 mg | Freq: Once | INTRAMUSCULAR | Status: AC
  Administered 2014-11-06: 10 mg

## 2014-11-06 NOTE — Progress Notes (Signed)
Subjective:     Patient ID: Adrian Matthews, male   DOB: December 06, 1945, 69 y.o.   MRN: [id number]  HPI patient presents with continued discomfort in the big toe joint right with inflammation. States it's improved by about 50-70% but still sore   Review of Systems     Objective:   Physical Exam Neurovascular status intact with continued discomfort in the right first MPJ that has improved but is present    Assessment:     Inflammatory capsulitis right first MPJ with possibility for gout    Plan:     Reinjected the inflammatory joint 3 mg Kenalog dexamethasone 5 mg Xylocaine and advised on wider shoe and to reappoint if symptoms persist

## 2014-11-10 ENCOUNTER — Other Ambulatory Visit: Payer: Self-pay | Admitting: *Deleted

## 2014-11-10 MED ORDER — TRAMADOL HCL 50 MG PO TABS: 50.0000 mg | ORAL_TABLET | Freq: Three times a day (TID) | ORAL | Status: DC | PRN

## 2014-11-10 NOTE — Telephone Encounter (Signed)
Patient stated that the medication worked and he would like a Rx faxed to [facility]. Printed and faxed.

## 2014-12-19 ENCOUNTER — Other Ambulatory Visit: Payer: Medicare Other

## 2014-12-19 DIAGNOSIS — E1129 Type 2 diabetes mellitus with other diabetic kidney complication: Secondary | ICD-10-CM | POA: Diagnosis not present

## 2014-12-19 DIAGNOSIS — E1165 Type 2 diabetes mellitus with hyperglycemia: Secondary | ICD-10-CM | POA: Diagnosis not present

## 2014-12-19 DIAGNOSIS — IMO0002 Reserved for concepts with insufficient information to code with codable children: Secondary | ICD-10-CM

## 2014-12-20 LAB — COMPREHENSIVE METABOLIC PANEL
ALT: 14 IU/L (ref 0–44)
AST: 18 IU/L (ref 0–40)
Albumin/Globulin Ratio: 1.4 (ref 1.1–2.5)
Albumin: 4.1 g/dL (ref 3.6–4.8)
Alkaline Phosphatase: 90 IU/L (ref 39–117)
BUN/Creatinine Ratio: 14 (ref 10–22)
BUN: 19 mg/dL (ref 8–27)
Bilirubin Total: <0.2 mg/dL (ref 0.0–1.2)
CO2: 26 mmol/L (ref 18–29)
Calcium: 9.9 mg/dL (ref 8.6–10.2)
Chloride: 97 mmol/L (ref 97–108)
Creatinine, Ser: 1.39 mg/dL — ABNORMAL HIGH (ref 0.76–1.27)
GFR calc Af Amer: 60 mL/min/{1.73_m2} (ref >59)
GFR calc non Af Amer: 52 mL/min/{1.73_m2} — ABNORMAL LOW (ref >59)
Globulin, Total: 2.9 g/dL (ref 1.5–4.5)
Glucose: 71 mg/dL (ref 65–99)
Potassium: 3.1 mmol/L — ABNORMAL LOW (ref 3.5–5.2)
Sodium: 140 mmol/L (ref 134–144)
Total Protein: 7 g/dL (ref 6.0–8.5)

## 2014-12-20 LAB — MICROALBUMIN / CREATININE URINE RATIO
Creatinine, Urine: 122.4 mg/dL (ref 22.0–328.0)
MICROALB/CREAT RATIO: 2.5 mg/g creat (ref 0.0–30.0)
Microalbumin, Urine: 3.1 ug/mL (ref 0.0–17.0)

## 2014-12-20 LAB — LIPID PANEL
Chol/HDL Ratio: 3.6 ratio units (ref 0.0–5.0)
Cholesterol, Total: 155 mg/dL (ref 100–199)
HDL: 43 mg/dL (ref >39)
LDL Calculated: 95 mg/dL (ref 0–99)
Triglycerides: 85 mg/dL (ref 0–149)
VLDL Cholesterol Cal: 17 mg/dL (ref 5–40)

## 2014-12-20 LAB — HEMOGLOBIN A1C
Est. average glucose Bld gHb Est-mCnc: 157 mg/dL
Hgb A1c MFr Bld: 7.1 % — ABNORMAL HIGH (ref 4.8–5.6)

## 2014-12-21 ENCOUNTER — Ambulatory Visit (INDEPENDENT_AMBULATORY_CARE_PROVIDER_SITE_OTHER): Payer: Medicare Other | Admitting: Internal Medicine

## 2014-12-21 ENCOUNTER — Encounter: Payer: Self-pay | Admitting: Internal Medicine

## 2014-12-21 VITALS — BP 132/80 | HR 79 | Temp 97.7°F | Ht 71.0 in | Wt 316.0 lb

## 2014-12-21 DIAGNOSIS — I10 Essential (primary) hypertension: Secondary | ICD-10-CM

## 2014-12-21 DIAGNOSIS — E785 Hyperlipidemia, unspecified: Secondary | ICD-10-CM | POA: Diagnosis not present

## 2014-12-21 DIAGNOSIS — E1142 Type 2 diabetes mellitus with diabetic polyneuropathy: Secondary | ICD-10-CM | POA: Diagnosis not present

## 2014-12-21 DIAGNOSIS — E876 Hypokalemia: Secondary | ICD-10-CM | POA: Diagnosis not present

## 2014-12-21 DIAGNOSIS — E1121 Type 2 diabetes mellitus with diabetic nephropathy: Secondary | ICD-10-CM | POA: Diagnosis not present

## 2014-12-21 MED ORDER — POTASSIUM CHLORIDE CRYS ER 10 MEQ PO TBCR: 10.0000 meq | EXTENDED_RELEASE_TABLET | Freq: Three times a day (TID) | ORAL | Status: DC

## 2014-12-21 NOTE — Patient Instructions (Signed)
Keep up the good work with your rabbit food!  It's given you great results with sugar average going down and weight loss.

## 2014-12-21 NOTE — Progress Notes (Signed)
Patient ID: Adrian Matthews, male   DOB: 1946-05-22, 69 y.o.   MRN: [id number]   Location:  [facility]  Code Status: full code, has living will/hcpoa paperwork, but has not brought it in  No Known Allergies  Chief Complaint  Patient presents with  . Medical Management of Chronic Issues    4 month follow-up, discuss labs (copy printed)      HPI: Patient is a 69 y.o. black male seen in the office today for med mgt of chronic diseases.    Has been eating rabbit food and baked food.  Says taste buds are getting tired of it.  Goes to gym and gets in water. Has lost weight about 12 lbs since dec.  Had one low sugar of 40.  Doesn't follow regular meals.     Notes he now has neuropathy in his right foot, gets pain and swelling also.  Got two shots in big toe on right.  Swells when goes to gym.  Thought maybe gout, but says he had inflammatory capsulitis right first MPJ with possibility of gout.   Is limited in how much he can walk around.  Driving hurts his big toe also.  Shot in joint did help, but swelling did not go down until a week later.    Review of Systems:  Review of Systems  Constitutional: Negative for fever, chills and malaise/fatigue.  HENT: Negative for hearing loss.   Eyes: Negative for blurred vision.  Respiratory: Negative for shortness of breath.   Cardiovascular: Negative for chest pain.  Gastrointestinal: Negative for abdominal pain.  Genitourinary: Negative for dysuria.  Musculoskeletal: Positive for joint pain. Negative for falls.       Right big toe  Neurological: Positive for sensory change. Negative for dizziness, weakness and headaches.  Psychiatric/Behavioral: Negative for depression and memory loss.     Past Medical History  Diagnosis Date  . DM (diabetes mellitus)   . Hypertension   . Hyperlipidemia   . Osteoarthrosis, unspecified whether generalized or localized, lower leg   . Primary localized osteoarthrosis,  unspecified site   . Asymptomatic varicose veins   . New daily persistent headache   . Unspecified hereditary and idiopathic peripheral neuropathy   . Chronic kidney disease   . Monoclonal paraproteinemia   . Gout, unspecified   . Hypercalcemia   . Hyperosmolality and/or hypernatremia   . Diaphragmatic hernia without mention of obstruction or gangrene   . Unspecified disorder of kidney and ureter   . Other malaise and fatigue   . GERD (gastroesophageal reflux disease)     Past Surgical History  Procedure Laterality Date  . Carpal tunnel release  2007  . Colonoscopy N/A 06/06/2013    Procedure: COLONOSCOPY;  Surgeon: Cleotis Nipper, MD;  Location: [facility] ENDOSCOPY;  Service: Endoscopy;  Laterality: N/A;    Social History:   reports that he has never smoked. He does not have any smokeless tobacco history on file. He reports that he does not drink alcohol or use illicit drugs.  Family History  Problem Relation Age of Onset  . Hypertension Sister   . Hypertension Brother   . Diabetes Brother   . Hypertension Brother   . Hypertension Sister     Medications: Patient's Medications  New Prescriptions   No medications on file  Previous Medications   AMLODIPINE (NORVASC) 10 MG TABLET    TAKE 1 TABLET DAILY FOR BLOOD PRESSURE   ATORVASTATIN (LIPITOR) 40 MG TABLET  Take 1 tablet (40 mg total) by mouth daily.   CHLORTHALIDONE (HYGROTON) 25 MG TABLET    Take one tablet by mouth once daily for blood pressure.   GLUCOSE BLOOD (FREESTYLE LITE) TEST STRIP    Check blood sugar 3 times daily DX 250.00   INSULIN GLARGINE (LANTUS SOLOSTAR) 100 UNIT/ML SOLOSTAR PEN    Inject 60 Units into the skin daily at 10 pm.   INSULIN LISPRO (HUMALOG KWIKPEN) 100 UNIT/ML KIWKPEN    Inject 22 units before breakfast, 26 units before lunch & supper - plus sliding scale.   INSULIN PEN NEEDLE 32G X 4 MM MISC    22-26 units four times daily before meals.Dx: E11.9   KLOR-CON M10 10 MEQ TABLET    TAKE 1 TABLET  TWICE A DAY FOR POTASSIUM   LANCETS (FREESTYLE) LANCETS    Check blood sugar 3 times daily DX: 250.00 (Freestyle Lite Machine)   LOSARTAN (COZAAR) 100 MG TABLET    Take 1 tablet (100 mg total) by mouth daily. For diabetic nephropathy   NEXIUM 40 MG CAPSULE    TAKE 1 CAPSULE DAILY   TERBINAFINE (LAMISIL) 250 MG TABLET    Take 1 tablet (250 mg total) by mouth daily.   TRAMADOL (ULTRAM) 50 MG TABLET    Take 1 tablet (50 mg total) by mouth every 8 (eight) hours as needed for moderate pain.  Modified Medications   No medications on file  Discontinued Medications   No medications on file     Physical Exam: Filed Vitals:   12/21/14 0806  BP: 132/80  Pulse: 79  Temp: 97.7 F (36.5 C)  TempSrc: Oral  Height: 5\' 11"  (1.803 m)  Weight: 316 lb (143.337 kg)  SpO2: 99%  Physical Exam  Constitutional: He is oriented to person, place, and time. He appears well-developed and well-nourished. No distress.  Obese black male  Cardiovascular: Normal rate, regular rhythm and normal heart sounds.   Pulmonary/Chest: Effort normal and breath sounds normal.  Abdominal: Soft. Bowel sounds are normal. He exhibits no distension and no mass. There is no tenderness.  Musculoskeletal: Normal range of motion.  Neurological: He is alert and oriented to person, place, and time.  Psychiatric: He has a normal mood and affect.    Labs reviewed: Basic Metabolic Panel:  Recent Labs  06/29/14 0821 09/21/14 0832 12/19/14 0833  NA 141 139 140  K 3.6 3.5 3.1*  CL 97 96* 97  CO2 25 28 26   GLUCOSE 87 146* 71  BUN 18 18 19   CREATININE 1.38* 1.46* 1.39*  CALCIUM 9.8 9.9 9.9   Liver Function Tests:  Recent Labs  06/29/14 0821 09/21/14 0832 12/19/14 0833  AST 18 21 18   ALT 14 17 14   ALKPHOS 85 76 90  BILITOT 0.2 0.3 <0.2  PROT 6.7 6.9 7.0   No results for input(s): LIPASE, AMYLASE in the last 8760 hours. No results for input(s): AMMONIA in the last 8760 hours. CBC: No results for input(s): WBC,  NEUTROABS, HGB, HCT, MCV, PLT in the last 8760 hours. Lipid Panel:  Recent Labs  02/23/14 0826 06/29/14 0821 12/19/14 0833  CHOL 142 154 155  HDL 41 39* 43  LDLCALC 77 90 95  TRIG 119 127 85  CHOLHDL 3.5 3.9 3.6   Lab Results  Component Value Date   HGBA1C 7.1* 12/19/2014    Assessment/Plan 1. Hypokalemia - advised to increase his potassium to tid from bid due to K 3.1 -potassium chloride (KLOR-CON M10) 10 MEQ  tablet; Take 1 tablet (10 mEq total) by mouth 3 (three) times daily.  Dispense: 270 tablet; Refill: 1 Sugar average much improved since last time while eating "rabbit food"  2. Diabetic nephropathy associated with type 2 diabetes mellitus -urine microalbumin was wnl -continues on losartan  3. Diabetic polyneuropathy associated with type 2 diabetes mellitus -nerve pains in feet right greater than left  4. Hyperlipidemia LDL goal <70 -cont lipitor, water exercise, dietary changes  5. Obesity, Class III, BMI 40-49.9 (morbid obesity) -has lost weight with diet and minimal exercise due to toe pain  6. Essential hypertension -bp at goal with current regimen, no changes  Labs/tests ordered:  No new orders--sees Cathey Monday  Next appt:  4 mos  Sueo Cullen L. Laureano Hetzer, D.O. [facility] [address], [place] [address] Cell Phone (Mon-Fri 8am-5pm):  [phone] On Call:  [phone] & follow prompts after 5pm & weekends Office Phone:  [phone] Office Fax:  [phone]

## 2014-12-25 ENCOUNTER — Encounter: Payer: Self-pay | Admitting: Pharmacotherapy

## 2014-12-25 ENCOUNTER — Ambulatory Visit (INDEPENDENT_AMBULATORY_CARE_PROVIDER_SITE_OTHER): Payer: Medicare Other | Admitting: Pharmacotherapy

## 2014-12-25 VITALS — BP 144/74 | HR 78 | Temp 98.1°F | Resp 20 | Ht 71.0 in | Wt 317.0 lb

## 2014-12-25 DIAGNOSIS — I10 Essential (primary) hypertension: Secondary | ICD-10-CM | POA: Diagnosis not present

## 2014-12-25 DIAGNOSIS — E1165 Type 2 diabetes mellitus with hyperglycemia: Secondary | ICD-10-CM

## 2014-12-25 DIAGNOSIS — N183 Chronic kidney disease, stage 3 unspecified: Secondary | ICD-10-CM

## 2014-12-25 DIAGNOSIS — IMO0002 Reserved for concepts with insufficient information to code with codable children: Secondary | ICD-10-CM

## 2014-12-25 DIAGNOSIS — E1129 Type 2 diabetes mellitus with other diabetic kidney complication: Secondary | ICD-10-CM | POA: Diagnosis not present

## 2014-12-25 NOTE — Patient Instructions (Signed)
Keep up the good work

## 2014-12-25 NOTE — Progress Notes (Signed)
  Subjective:    Adrian Matthews is a 69 y.o.African American male who presents for follow-up of Type 2 diabetes mellitus.   A1C has improved from 7.6% to 7.1%  Average BG:   173m/dl Denies significant hypoglycemia He can feel hypoglycemia before it is a problem.  Knows how to treat.  Eating more "rabbit food" Seeing Dr. RPaulla Dollyfor foot care Peripheral edema noted. Exercise limited due to gout.  Toe stays sore.  Has new wide shoes. Swims when he cans. Feet feel hot when he goes to bed. Denies problems with vision. Nocturia has improved. Polydipsia has improved.   Review of Systems A comprehensive review of systems was negative except for: Cardiovascular: positive for lower extremity edema Genitourinary: positive for nocturia Musculoskeletal: positive for arthralgias Neurological: positive for burning in feet    Objective:    BP 144/74 mmHg  Pulse 78  Temp(Src) 98.1 F (36.7 C) (Oral)  Resp 20  Ht 5' 11"$  (1.803 m)  Wt 317 lb (143.79 kg)  BMI 44.23 kg/m2  SpO2 92%  General:  alert, cooperative and no distress  Oropharynx: normal findings: lips normal without lesions and gums healthy   Eyes:  negative findings: lids and lashes normal and conjunctivae and sclerae normal   Ears:  external ears normal        Lung: clear to auscultation bilaterally  Heart:  regular rate and rhythm     Extremities: edema bilateral lower extremities  Skin: warm and dry, no hyperpigmentation, vitiligo, or suspicious lesions     Neuro: mental status, speech normal, alert and oriented x3 and gait and station normal   Lab Review GLUCOSE (mg/dL)  Date Value  12/19/2014 71  09/21/2014 146*  06/29/2014 87   GLUCOSE, BLD (mg/dL)  Date Value  06/05/2013 112*  06/04/2013 93  02/17/2011 175*   CO2 (mmol/L)  Date Value  12/19/2014 26  09/21/2014 28  06/29/2014 25   BUN (mg/dL)  Date Value  12/19/2014 19  09/21/2014 18  06/29/2014 18  06/05/2013 14  06/04/2013 18  02/17/2011 25*    CREATININE (mg/dL)  Date Value  12/07/2009 1.87*  08/22/2009 1.85*   CREATININE, SER (mg/dL)  Date Value  12/19/2014 1.39*  09/21/2014 1.46*  06/29/2014 1.38*   A1C:  7.1% Microalbumin:  3.1 Total cholesterol:  158 Triglycerides:  85 HDL:  43 LDL:  95  Assessment:    Diabetes Mellitus type II, under good control. A1C improved to 7.1% BP slightly above target <140/90 Renal function has improved   Plan:    1.  Rx changes: none  2.  Continue Lantus 60 units daily. 3.  Continue Humalog 22 units before breakfast, 26 units before lunch & supper. 4.  Praised dietary changes. 5.  Exercise goal is 30-45 minutes 5 x week.  [facility].  Counseled on foot care. 7.  BP usually OK.  Will continue losartan, chlorthalidone and monitor. 8.  LDL at goal on atorvastatin. 9.  RTC in 3 months

## 2014-12-31 ENCOUNTER — Other Ambulatory Visit: Payer: Self-pay | Admitting: Internal Medicine

## 2015-01-16 ENCOUNTER — Other Ambulatory Visit: Payer: Self-pay | Admitting: Internal Medicine

## 2015-02-05 ENCOUNTER — Encounter: Payer: Self-pay | Admitting: Internal Medicine

## 2015-02-19 ENCOUNTER — Other Ambulatory Visit: Payer: Self-pay | Admitting: Internal Medicine

## 2015-03-06 ENCOUNTER — Other Ambulatory Visit: Payer: Self-pay | Admitting: *Deleted

## 2015-03-06 DIAGNOSIS — E119 Type 2 diabetes mellitus without complications: Secondary | ICD-10-CM

## 2015-03-06 MED ORDER — INSULIN LISPRO 100 UNIT/ML (KWIKPEN): PEN_INJECTOR | SUBCUTANEOUS | Status: DC

## 2015-03-06 NOTE — Telephone Encounter (Signed)
[facility]

## 2015-03-09 ENCOUNTER — Other Ambulatory Visit: Payer: Self-pay | Admitting: Internal Medicine

## 2015-03-15 ENCOUNTER — Ambulatory Visit (INDEPENDENT_AMBULATORY_CARE_PROVIDER_SITE_OTHER): Payer: Medicare Other

## 2015-03-15 ENCOUNTER — Ambulatory Visit (INDEPENDENT_AMBULATORY_CARE_PROVIDER_SITE_OTHER): Payer: Medicare Other | Admitting: Podiatry

## 2015-03-15 ENCOUNTER — Encounter: Payer: Self-pay | Admitting: Podiatry

## 2015-03-15 VITALS — BP 123/67 | HR 87 | Resp 18

## 2015-03-15 DIAGNOSIS — M1A1710 Lead-induced chronic gout, right ankle and foot, without tophus (tophi): Secondary | ICD-10-CM

## 2015-03-15 DIAGNOSIS — M779 Enthesopathy, unspecified: Secondary | ICD-10-CM

## 2015-03-15 DIAGNOSIS — R52 Pain, unspecified: Secondary | ICD-10-CM

## 2015-03-15 DIAGNOSIS — M2141 Flat foot [pes planus] (acquired), right foot: Secondary | ICD-10-CM

## 2015-03-15 DIAGNOSIS — M2142 Flat foot [pes planus] (acquired), left foot: Secondary | ICD-10-CM

## 2015-03-15 DIAGNOSIS — T560X1A Toxic effect of lead and its compounds, accidental (unintentional), initial encounter: Secondary | ICD-10-CM

## 2015-03-15 MED ORDER — TRIAMCINOLONE ACETONIDE 10 MG/ML IJ SUSP
10.0000 mg | Freq: Once | INTRAMUSCULAR | Status: AC
  Administered 2015-03-15: 10 mg

## 2015-03-16 NOTE — Progress Notes (Signed)
Subjective:     Patient ID: Adrian Matthews, male   DOB: 12-Jun-1946, 69 y.o.   MRN: [id number]  HPI patient states my right big toe has started to become inflamed again and I wanted to have it worked on and I have been trying to watch my diet to prevent gout attacks   Review of Systems     Objective:   Physical Exam Neurovascular status unchanged with inflammation around the right first MPJ localized in nature and mild discomfort on the lateral side of the joint with the interphalangeal joint also mildly affected    Assessment:     Interphalangeal capsulitis right first MPJ along with first MPJ capsulitis and possible gout    Plan:     Reviewed both conditions and due to the inflammation did inject around the first MPJ 3 Milligan Kenalog 5 g Xylocaine and instructed on the importance of good diet. Reappoint to recheck

## 2015-03-27 ENCOUNTER — Other Ambulatory Visit: Payer: Self-pay | Admitting: Internal Medicine

## 2015-03-29 ENCOUNTER — Other Ambulatory Visit: Payer: Medicare Other

## 2015-03-29 DIAGNOSIS — E1165 Type 2 diabetes mellitus with hyperglycemia: Principal | ICD-10-CM

## 2015-03-29 DIAGNOSIS — E1129 Type 2 diabetes mellitus with other diabetic kidney complication: Secondary | ICD-10-CM | POA: Diagnosis not present

## 2015-03-29 DIAGNOSIS — IMO0002 Reserved for concepts with insufficient information to code with codable children: Secondary | ICD-10-CM

## 2015-03-30 LAB — COMPREHENSIVE METABOLIC PANEL
ALT: 12 IU/L (ref 0–44)
AST: 17 IU/L (ref 0–40)
Albumin/Globulin Ratio: 1.3 (ref 1.1–2.5)
Albumin: 3.8 g/dL (ref 3.6–4.8)
Alkaline Phosphatase: 84 IU/L (ref 39–117)
BUN/Creatinine Ratio: 13 (ref 10–22)
BUN: 19 mg/dL (ref 8–27)
Bilirubin Total: 0.2 mg/dL (ref 0.0–1.2)
CO2: 24 mmol/L (ref 18–29)
Calcium: 9.6 mg/dL (ref 8.6–10.2)
Chloride: 97 mmol/L (ref 97–108)
Creatinine, Ser: 1.46 mg/dL — ABNORMAL HIGH (ref 0.76–1.27)
GFR calc Af Amer: 56 mL/min/{1.73_m2} — ABNORMAL LOW (ref >59)
GFR calc non Af Amer: 49 mL/min/{1.73_m2} — ABNORMAL LOW (ref >59)
Globulin, Total: 2.9 g/dL (ref 1.5–4.5)
Glucose: 132 mg/dL — ABNORMAL HIGH (ref 65–99)
Potassium: 3.4 mmol/L — ABNORMAL LOW (ref 3.5–5.2)
Sodium: 138 mmol/L (ref 134–144)
Total Protein: 6.7 g/dL (ref 6.0–8.5)

## 2015-03-30 LAB — HEMOGLOBIN A1C
Est. average glucose Bld gHb Est-mCnc: 157 mg/dL
Hgb A1c MFr Bld: 7.1 % — ABNORMAL HIGH (ref 4.8–5.6)

## 2015-04-02 ENCOUNTER — Ambulatory Visit (INDEPENDENT_AMBULATORY_CARE_PROVIDER_SITE_OTHER): Payer: Medicare Other | Admitting: Pharmacotherapy

## 2015-04-02 ENCOUNTER — Encounter: Payer: Self-pay | Admitting: Pharmacotherapy

## 2015-04-02 VITALS — BP 132/80 | HR 80 | Temp 97.8°F | Resp 20 | Ht 71.0 in | Wt 313.0 lb

## 2015-04-02 DIAGNOSIS — IMO0002 Reserved for concepts with insufficient information to code with codable children: Secondary | ICD-10-CM

## 2015-04-02 DIAGNOSIS — I10 Essential (primary) hypertension: Secondary | ICD-10-CM | POA: Diagnosis not present

## 2015-04-02 DIAGNOSIS — N183 Chronic kidney disease, stage 3 unspecified: Secondary | ICD-10-CM

## 2015-04-02 DIAGNOSIS — E1129 Type 2 diabetes mellitus with other diabetic kidney complication: Secondary | ICD-10-CM | POA: Diagnosis not present

## 2015-04-02 DIAGNOSIS — E1165 Type 2 diabetes mellitus with hyperglycemia: Secondary | ICD-10-CM

## 2015-04-02 NOTE — Progress Notes (Signed)
  Subjective:    Adrian Matthews is a 70 y.o.African American male who presents for follow-up of Type 2 diabetes mellitus.   A1C:  7.1%  He forgot to bring his blood glucose meter. He reports they are "fairly good" Lowest BG:  55m/dl He says average BG:  1340mdl  He is taking Lantus 60 units daily He is taking Humalog 26 units with each meal.  He is eating more veggies.  Less starch. Exercise limited due to pain in feet. He went to podiatrist last month.  Has arthritis in his feet.  Requires injections in right foot. Some peripheral edema Denies problems with vision.  Eye exam due in September 2016. Nocturia has improved.  Feels better.  Has more energy. Staying well hydrated with water.  Review of Systems A comprehensive review of systems was negative except for: Cardiovascular: positive for lower extremity edema Genitourinary: positive for nocturia Musculoskeletal: positive for arthralgias    Objective:    BP 132/80 mmHg  Pulse 80  Temp(Src) 97.8 F (36.6 C) (Oral)  Resp 20  Ht 5' 11"$  (1.803 m)  Wt 313 lb (141.976 kg)  BMI 43.67 kg/m2  SpO2 98%  General:  alert, cooperative and no distress  Oropharynx: normal findings: lips normal without lesions and gums healthy   Eyes:  negative findings: lids and lashes normal and conjunctivae and sclerae normal   Ears:  external ears normal        Lung: clear to auscultation bilaterally  Heart:  regular rate and rhythm     Extremities: edema trace bilateral lower extremities  Skin: warm and dry, no hyperpigmentation, vitiligo, or suspicious lesions     Neuro: mental status, speech normal, alert and oriented x3 and gait and station normal   Lab Review GLUCOSE (mg/dL)  Date Value  03/29/2015 132*  12/19/2014 71  09/21/2014 146*   GLUCOSE, BLD (mg/dL)  Date Value  06/05/2013 112*  06/04/2013 93  02/17/2011 175*   CO2 (mmol/L)  Date Value  03/29/2015 24  12/19/2014 26  09/21/2014 28   BUN (mg/dL)  Date  Value  03/29/2015 19  12/19/2014 19  09/21/2014 18  06/05/2013 14  06/04/2013 18  02/17/2011 25*   CREATININE (mg/dL)  Date Value  12/07/2009 1.87*  08/22/2009 1.85*   CREATININE, SER (mg/dL)  Date Value  03/29/2015 1.46*  12/19/2014 1.39*  09/21/2014 1.46*       Assessment:    Diabetes Mellitus type II, under good control. A1C at 7.1% BP at goal <140/90 Renal function stabel   Plan:    1.  Rx changes: none 2.  Continue Lantus 60 units daily.  Advised him not to skip doses. 3.  Continue Humalog 26 units with each meal.  May lower dose if he cuts portion sizes. 4.  Praised exercise efforts (swimming).  [facility].  Counseled on nutrition goals. 6.  BP at goal <140/90 7.  Renal function is stable.

## 2015-04-02 NOTE — Patient Instructions (Signed)
Keep up the good work! Start going to the [facility]

## 2015-04-16 ENCOUNTER — Other Ambulatory Visit: Payer: Self-pay | Admitting: Internal Medicine

## 2015-04-26 ENCOUNTER — Encounter: Payer: Self-pay | Admitting: Internal Medicine

## 2015-04-26 ENCOUNTER — Ambulatory Visit (INDEPENDENT_AMBULATORY_CARE_PROVIDER_SITE_OTHER): Payer: Medicare Other | Admitting: Internal Medicine

## 2015-04-26 VITALS — BP 120/72 | HR 81 | Temp 97.7°F | Resp 20 | Ht 71.0 in | Wt 318.4 lb

## 2015-04-26 DIAGNOSIS — M1990 Unspecified osteoarthritis, unspecified site: Secondary | ICD-10-CM

## 2015-04-26 DIAGNOSIS — N183 Chronic kidney disease, stage 3 unspecified: Secondary | ICD-10-CM

## 2015-04-26 DIAGNOSIS — E876 Hypokalemia: Secondary | ICD-10-CM

## 2015-04-26 DIAGNOSIS — E1129 Type 2 diabetes mellitus with other diabetic kidney complication: Secondary | ICD-10-CM | POA: Diagnosis not present

## 2015-04-26 DIAGNOSIS — I10 Essential (primary) hypertension: Secondary | ICD-10-CM | POA: Diagnosis not present

## 2015-04-26 DIAGNOSIS — J302 Other seasonal allergic rhinitis: Secondary | ICD-10-CM

## 2015-04-26 DIAGNOSIS — E1142 Type 2 diabetes mellitus with diabetic polyneuropathy: Secondary | ICD-10-CM | POA: Diagnosis not present

## 2015-04-26 DIAGNOSIS — E785 Hyperlipidemia, unspecified: Secondary | ICD-10-CM

## 2015-04-26 DIAGNOSIS — E1165 Type 2 diabetes mellitus with hyperglycemia: Secondary | ICD-10-CM | POA: Diagnosis not present

## 2015-04-26 DIAGNOSIS — IMO0002 Reserved for concepts with insufficient information to code with codable children: Secondary | ICD-10-CM

## 2015-04-26 MED ORDER — POTASSIUM CHLORIDE CRYS ER 10 MEQ PO TBCR: 10.0000 meq | EXTENDED_RELEASE_TABLET | Freq: Three times a day (TID) | ORAL | Status: DC

## 2015-04-26 NOTE — Progress Notes (Signed)
Patient ID: Adrian Matthews, male   DOB: 25-Jan-1946, 69 y.o.   MRN: [id number]   Location:  [facility]  Goals of Care: Advanced Directive information Does patient have an advance directive?: Yes, Type of Advance Directive: [place];Living will, Does patient want to make changes to advanced directive?: No - Patient declined   Chief Complaint  Patient presents with  . Medical Management of Chronic Issues    4 month follow-up    HPI: Patient is a 69 y.o. black male seen in the office today for medical mgt of his chronic diseases.  Goes to sleep and wakes up with mouth totally dry.  Also has dry cough.  Says he read that lantus can dehydrate you and make you thirsty.  Gets nightsweats, too, and has to keep his air on at a low temp.    Discussed drying of air from [facility].  Does use humidifier in living room in daytime.  If someone cuts the grass in the daytime, he's got to go back in or he starts coughing.  Even smell of coffee brewing made him cough.  People with strong perfume make him cough.  Stopped wearing cologne himself.  Allergy medicine he has does help him.  24 hr knocks him for a loop.  Has been on cozaar for years now.  Has a syrup he uses from herbal store that helps the cough, too.  Dentist suggested dry mouth treatment and he didn't like it.    Is not even taking anything for his arthritis right now, but if he doesn't swim and stretch, he can't get around as well.  Says he will need it in the cold weather.    HTN:  Well controlled with current medication.  Obesity:  Is eating rabbit food.  Says buying smaller clothes and shoes weigh more today.  His pants are loose at the waist.  Went to Dr. Paulla Dolly and had a shot in his big toe and has not had problems since.  Also got correct shoes--wide.    Review of Systems:  Review of Systems  Constitutional: Positive for weight loss. Negative for fever, chills and malaise/fatigue.    HENT:       Dry mouth  Eyes:       Dry eyes  Respiratory: Negative for shortness of breath.   Cardiovascular: Negative for chest pain, palpitations and leg swelling.  Gastrointestinal: Negative for abdominal pain, constipation, blood in stool and melena.  Genitourinary: Negative for dysuria, urgency and frequency.  Musculoskeletal: Negative for back pain, joint pain and falls.  Skin: Negative for itching and rash.  Neurological: Negative for dizziness and weakness.  Endo/Heme/Allergies:       Diabetes  Psychiatric/Behavioral: Negative for depression and memory loss.    Past Medical History  Diagnosis Date  . DM (diabetes mellitus)   . Hypertension   . Hyperlipidemia   . Osteoarthrosis, unspecified whether generalized or localized, lower leg   . Primary localized osteoarthrosis, unspecified site   . Asymptomatic varicose veins   . New daily persistent headache   . Unspecified hereditary and idiopathic peripheral neuropathy   . Chronic kidney disease   . Monoclonal paraproteinemia   . Gout, unspecified   . Hypercalcemia   . Hyperosmolality and/or hypernatremia   . Diaphragmatic hernia without mention of obstruction or gangrene   . Unspecified disorder of kidney and ureter   . Other malaise and fatigue   . GERD (gastroesophageal reflux disease)  Past Surgical History  Procedure Laterality Date  . Carpal tunnel release  2007  . Colonoscopy N/A 06/06/2013    Procedure: COLONOSCOPY;  Surgeon: Cleotis Nipper, MD;  Location: [facility] ENDOSCOPY;  Service: Endoscopy;  Laterality: N/A;    No Known Allergies Medications: Patient's Medications  New Prescriptions   No medications on file  Previous Medications   AMLODIPINE (NORVASC) 10 MG TABLET    TAKE 1 TABLET DAILY FOR BLOOD PRESSURE   ATORVASTATIN (LIPITOR) 40 MG TABLET    TAKE 1 TABLET DAILY   CHLORTHALIDONE (HYGROTON) 25 MG TABLET    Take one tablet by mouth once daily for blood pressure.   FREESTYLE LITE TEST STRIP     CHECK BLOOD SUGAR THREE TIMES A DAY   INSULIN GLARGINE (LANTUS SOLOSTAR) 100 UNIT/ML SOLOSTAR PEN    Inject 60 Units into the skin daily at 10 pm.   INSULIN LISPRO (HUMALOG KWIKPEN) 100 UNIT/ML KIWKPEN    Inject 22 units before breakfast, 26 units before lunch & supper - plus sliding scale.   INSULIN PEN NEEDLE 32G X 4 MM MISC    22-26 units four times daily before meals.Dx: E11.9   LANCETS (FREESTYLE) LANCETS    Check blood sugar 3 times daily DX: 250.00 (Freestyle Lite Machine)   LOSARTAN (COZAAR) 100 MG TABLET    TAKE 1 TABLET DAILY   NEXIUM 40 MG CAPSULE    TAKE 1 CAPSULE DAILY   TERBINAFINE (LAMISIL) 250 MG TABLET    Take 1 tablet (250 mg total) by mouth daily.   TRAMADOL (ULTRAM) 50 MG TABLET    Take 1 tablet (50 mg total) by mouth every 8 (eight) hours as needed for moderate pain.  Modified Medications   Modified Medication Previous Medication   POTASSIUM CHLORIDE (KLOR-CON M10) 10 MEQ TABLET potassium chloride (KLOR-CON M10) 10 MEQ tablet      Take 1 tablet (10 mEq total) by mouth 3 (three) times daily.    Take 1 tablet (10 mEq total) by mouth 3 (three) times daily.  Discontinued Medications   KLOR-CON M10 10 MEQ TABLET    TAKE 1 TABLET TWICE A DAY FOR POTASSIUM    Physical Exam: Filed Vitals:   04/26/15 0831  BP: 120/72  Pulse: 81  Temp: 97.7 F (36.5 C)  TempSrc: Oral  Resp: 20  Height: 5\' 11"  (1.803 m)  Weight: 318 lb 6.4 oz (144.425 kg)  SpO2: 95%   Physical Exam  Constitutional: He is oriented to person, place, and time. He appears well-developed and well-nourished. No distress.  Obese black male  Cardiovascular: Normal rate, regular rhythm, normal heart sounds and intact distal pulses.   Pulmonary/Chest: Effort normal and breath sounds normal. No respiratory distress.  Abdominal: Soft. Bowel sounds are normal. He exhibits no distension. There is no tenderness.  Musculoskeletal: Normal range of motion.  Neurological: He is alert and oriented to person, place, and  time.  Skin: Skin is warm and dry.  Psychiatric: He has a normal mood and affect.    Labs reviewed: Basic Metabolic Panel:  Recent Labs  09/21/14 0832 12/19/14 0833 03/29/15 0812  NA 139 140 138  K 3.5 3.1* 3.4*  CL 96* 97 97  CO2 28 26 24   GLUCOSE 146* 71 132*  BUN 18 19 19   CREATININE 1.46* 1.39* 1.46*  CALCIUM 9.9 9.9 9.6   Liver Function Tests:  Recent Labs  09/21/14 0832 12/19/14 0833 03/29/15 0812  AST 21 18 17   ALT 17 14 12  ALKPHOS 76 90 84  BILITOT 0.3 <0.2 0.2  PROT 6.9 7.0 6.7   No results for input(s): LIPASE, AMYLASE in the last 8760 hours. No results for input(s): AMMONIA in the last 8760 hours. CBC: No results for input(s): WBC, NEUTROABS, HGB, HCT, MCV, PLT in the last 8760 hours. Lipid Panel:  Recent Labs  06/29/14 0821 12/19/14 0833  CHOL 154 155  HDL 39* 43  LDLCALC 90 95  TRIG 127 85  CHOLHDL 3.9 3.6   Lab Results  Component Value Date   HGBA1C 7.1* 03/29/2015     Assessment/Plan 1. DM (diabetes mellitus), type 2, uncontrolled, with renal complications -control could still be better considering his younger age -cont rabbit food diet as he calls it and swimming regimen, lantus with humalog meal coverage, statin and cozaar - Basic metabolic panel; Future - Hemoglobin A1c; Future  2. Essential hypertension -bp is well controlled with cozaar, chlorthalidone and norvasc, cont these, low sodium diet - CBC with Differential/Platelet; Future - Basic metabolic panel; Future  3. Chronic kidney disease, stage 3 - cont losartan and avoid nsaids, maintain adequate hydration - Basic metabolic panel; Future  4. Diabetic polyneuropathy associated with type 2 diabetes mellitus -no complaints about this today; uses prn ultram for this and his joint pains and foot pain when they flare up  5. Hyperlipidemia LDL goal <70 -last LDL still not under 70 which is ideal with his diabetes -cont statin therapy and reassess - Lipid panel;  Future--if not at goal, may add another agent  6. Obesity, Class III, BMI 40-49.9 (morbid obesity) - encouraged his weight loss, counseled on diet and exercise - Basic metabolic panel; Future - Hemoglobin A1c; Future - Lipid panel; Future  7. Arthritis -stable with regular swimming/water aerobics, cont ultram as needed for severe pain, tylenol for mild to moderate  8. Seasonal allergies -is taking the allergy medication recommended by his allergist but does not know name, this is helping  9. Hypokalemia - cont potassium, last K wnl - potassium chloride (KLOR-CON M10) 10 MEQ tablet; Take 1 tablet (10 mEq total) by mouth 3 (three) times daily.  Dispense: 270 tablet; Refill: 1   Labs/tests ordered: Orders Placed This Encounter  Procedures  . CBC with Differential/Platelet    Standing Status: Future     Number of Occurrences:      Standing Expiration Date: 12/25/2015  . Basic metabolic panel    Standing Status: Future     Number of Occurrences:      Standing Expiration Date: 12/25/2015    Order Specific Question:  Has the patient fasted?    Answer:  Yes  . Hemoglobin A1c    Standing Status: Future     Number of Occurrences:      Standing Expiration Date: 12/25/2015  . Lipid panel    Standing Status: Future     Number of Occurrences:      Standing Expiration Date: 12/25/2015    Order Specific Question:  Has the patient fasted?    Answer:  Yes    Next appt:  4 mos for med mgt with labs before  Rosia Syme L. Kellyn Mansfield, D.O. [facility] [address], [place] [address] Cell Phone (Mon-Fri 8am-5pm):  [phone] On Call:  [phone] & follow prompts after 5pm & weekends Office Phone:  [phone] Office Fax:  [phone]

## 2015-06-11 DIAGNOSIS — E119 Type 2 diabetes mellitus without complications: Secondary | ICD-10-CM | POA: Diagnosis not present

## 2015-06-11 LAB — HM DIABETES EYE EXAM

## 2015-06-28 ENCOUNTER — Other Ambulatory Visit: Payer: Self-pay | Admitting: *Deleted

## 2015-06-28 ENCOUNTER — Other Ambulatory Visit: Payer: Self-pay | Admitting: Internal Medicine

## 2015-06-28 ENCOUNTER — Other Ambulatory Visit: Payer: Medicare Other

## 2015-06-28 DIAGNOSIS — E1129 Type 2 diabetes mellitus with other diabetic kidney complication: Secondary | ICD-10-CM

## 2015-06-28 DIAGNOSIS — E1165 Type 2 diabetes mellitus with hyperglycemia: Secondary | ICD-10-CM | POA: Diagnosis not present

## 2015-06-28 DIAGNOSIS — I10 Essential (primary) hypertension: Secondary | ICD-10-CM

## 2015-06-28 DIAGNOSIS — IMO0002 Reserved for concepts with insufficient information to code with codable children: Secondary | ICD-10-CM

## 2015-06-28 MED ORDER — CHLORTHALIDONE 25 MG PO TABS: ORAL_TABLET | ORAL | Status: DC

## 2015-06-28 NOTE — Telephone Encounter (Signed)
Spoke with patient regarding medication refill while in the office for lab work. He stated that he didn't have a refill on this medication.

## 2015-06-29 LAB — COMPREHENSIVE METABOLIC PANEL
ALT: 11 IU/L (ref 0–44)
AST: 19 IU/L (ref 0–40)
Albumin/Globulin Ratio: 1.3 (ref 1.1–2.5)
Albumin: 3.9 g/dL (ref 3.6–4.8)
Alkaline Phosphatase: 76 IU/L (ref 39–117)
BUN/Creatinine Ratio: 16 (ref 10–22)
BUN: 22 mg/dL (ref 8–27)
Bilirubin Total: 0.2 mg/dL (ref 0.0–1.2)
CO2: 26 mmol/L (ref 18–29)
Calcium: 9.9 mg/dL (ref 8.6–10.2)
Chloride: 93 mmol/L — ABNORMAL LOW (ref 97–108)
Creatinine, Ser: 1.35 mg/dL — ABNORMAL HIGH (ref 0.76–1.27)
GFR calc Af Amer: 62 mL/min/{1.73_m2} (ref >59)
GFR calc non Af Amer: 54 mL/min/{1.73_m2} — ABNORMAL LOW (ref >59)
Globulin, Total: 3 g/dL (ref 1.5–4.5)
Glucose: 72 mg/dL (ref 65–99)
Potassium: 3.2 mmol/L — ABNORMAL LOW (ref 3.5–5.2)
Sodium: 137 mmol/L (ref 134–144)
Total Protein: 6.9 g/dL (ref 6.0–8.5)

## 2015-06-29 LAB — MICROALBUMIN / CREATININE URINE RATIO
Creatinine, Urine: 92.6 mg/dL
MICROALB/CREAT RATIO: <3.2 mg/g creat (ref 0.0–30.0)
Microalbumin, Urine: <3 ug/mL

## 2015-06-29 LAB — HEMOGLOBIN A1C
Est. average glucose Bld gHb Est-mCnc: 154 mg/dL
Hgb A1c MFr Bld: 7 % — ABNORMAL HIGH (ref 4.8–5.6)

## 2015-07-02 ENCOUNTER — Encounter: Payer: Self-pay | Admitting: Pharmacotherapy

## 2015-07-02 ENCOUNTER — Ambulatory Visit (INDEPENDENT_AMBULATORY_CARE_PROVIDER_SITE_OTHER): Payer: Medicare Other | Admitting: Pharmacotherapy

## 2015-07-02 VITALS — BP 130/78 | HR 87 | Temp 98.0°F | Resp 20 | Ht 71.0 in | Wt 306.4 lb

## 2015-07-02 DIAGNOSIS — N183 Chronic kidney disease, stage 3 unspecified: Secondary | ICD-10-CM

## 2015-07-02 DIAGNOSIS — E1165 Type 2 diabetes mellitus with hyperglycemia: Secondary | ICD-10-CM | POA: Diagnosis not present

## 2015-07-02 DIAGNOSIS — I10 Essential (primary) hypertension: Secondary | ICD-10-CM

## 2015-07-02 DIAGNOSIS — E1129 Type 2 diabetes mellitus with other diabetic kidney complication: Secondary | ICD-10-CM | POA: Diagnosis not present

## 2015-07-02 DIAGNOSIS — IMO0002 Reserved for concepts with insufficient information to code with codable children: Secondary | ICD-10-CM

## 2015-07-02 DIAGNOSIS — E876 Hypokalemia: Secondary | ICD-10-CM | POA: Diagnosis not present

## 2015-07-02 MED ORDER — POTASSIUM CHLORIDE CRYS ER 20 MEQ PO TBCR: 20.0000 meq | EXTENDED_RELEASE_TABLET | Freq: Two times a day (BID) | ORAL | Status: DC

## 2015-07-02 NOTE — Patient Instructions (Signed)
Diabetes control is great!  Increase potassium to 80mEq twice daily

## 2015-07-02 NOTE — Progress Notes (Signed)
  Subjective:    Adrian Matthews is a 69 y.o.African American male who presents for follow-up of Type 2 diabetes mellitus.   A1C 7.0% (was 7.1%) Drinking a lot of water.  Average BG:  118mg /dl No hypoglycemia  He is eating healthy choices.  Baked not fried. He is complaining of dry mouth  He is complaining of night sweats. Swimming for exercise 3 days per week. Some peripheral edema. Denies problems with feet. Denies problems with vision.  Saw Dr. Delman Cheadle last month. Nocturia every night.  Has lost 12 pounds.  Potassium down to 3.2  Review of Systems A comprehensive review of systems was negative except for: Constitutional: positive for night sweats Cardiovascular: positive for lower extremity edema Genitourinary: positive for nocturia    Objective:    BP 130/78 mmHg  Pulse 87  Temp(Src) 98 F (36.7 C) (Oral)  Resp 20  Ht 5\' 11"  (1.803 m)  Wt 306 lb 6.4 oz (138.982 kg)  BMI 42.75 kg/m2  SpO2 95%  General:  alert, cooperative and no distress  Oropharynx: normal findings: lips normal without lesions and gums healthy   Eyes:  negative findings: lids and lashes normal and conjunctivae and sclerae normal   Ears:  external ears normal        Lung: clear to auscultation bilaterally  Heart:  regular rate and rhythm     Extremities: edema lower extremities  Skin: warm and dry, no hyperpigmentation, vitiligo, or suspicious lesions     Neuro: mental status, speech normal, alert and oriented x3 and gait and station normal   Lab Review GLUCOSE (mg/dL)  Date Value  06/28/2015 72  03/29/2015 132*  12/19/2014 71   GLUCOSE, BLD (mg/dL)  Date Value  06/05/2013 112*  06/04/2013 93  02/17/2011 175*   CO2 (mmol/L)  Date Value  06/28/2015 26  03/29/2015 24  12/19/2014 26   BUN (mg/dL)  Date Value  06/28/2015 22  03/29/2015 19  12/19/2014 19  06/05/2013 14  06/04/2013 18  02/17/2011 25*   CREATININE (mg/dL)  Date Value  12/07/2009 1.87*  08/22/2009 1.85*    CREATININE, SER (mg/dL)  Date Value  06/28/2015 1.35*  03/29/2015 1.46*  12/19/2014 1.39*       Assessment:    Diabetes Mellitus type II, under good control.   Potassium too low BP at goal <140/90   Plan:    1.  Rx changes: Increase potassium to 2 tablets twice daily 2.  Continue Lantus 60 units daily 3.  Continue Humalog with meals. 4.  Overnight hypoglycemia?  Advised him to check BG if he wakes with night sweats. 5.  Counseled on nutrition goals.  Praised weight loss. 6.  Exercise goal is 30-45 minutes 5 x week.   7.  HTN at goal <140/90

## 2015-07-15 ENCOUNTER — Other Ambulatory Visit: Payer: Self-pay | Admitting: Internal Medicine

## 2015-07-16 DIAGNOSIS — N183 Chronic kidney disease, stage 3 (moderate): Secondary | ICD-10-CM | POA: Diagnosis not present

## 2015-07-16 DIAGNOSIS — Z8639 Personal history of other endocrine, nutritional and metabolic disease: Secondary | ICD-10-CM | POA: Diagnosis not present

## 2015-07-16 DIAGNOSIS — N189 Chronic kidney disease, unspecified: Secondary | ICD-10-CM | POA: Diagnosis not present

## 2015-07-23 ENCOUNTER — Ambulatory Visit: Payer: Medicare Other | Admitting: Podiatry

## 2015-07-25 DIAGNOSIS — N183 Chronic kidney disease, stage 3 (moderate): Secondary | ICD-10-CM | POA: Diagnosis not present

## 2015-07-25 DIAGNOSIS — D631 Anemia in chronic kidney disease: Secondary | ICD-10-CM | POA: Diagnosis not present

## 2015-07-25 DIAGNOSIS — Z23 Encounter for immunization: Secondary | ICD-10-CM | POA: Diagnosis not present

## 2015-07-25 DIAGNOSIS — I129 Hypertensive chronic kidney disease with stage 1 through stage 4 chronic kidney disease, or unspecified chronic kidney disease: Secondary | ICD-10-CM | POA: Diagnosis not present

## 2015-07-25 DIAGNOSIS — Z8639 Personal history of other endocrine, nutritional and metabolic disease: Secondary | ICD-10-CM | POA: Diagnosis not present

## 2015-08-27 ENCOUNTER — Other Ambulatory Visit: Payer: Medicare Other

## 2015-08-27 DIAGNOSIS — I10 Essential (primary) hypertension: Secondary | ICD-10-CM

## 2015-08-27 DIAGNOSIS — IMO0002 Reserved for concepts with insufficient information to code with codable children: Secondary | ICD-10-CM

## 2015-08-27 DIAGNOSIS — N183 Chronic kidney disease, stage 3 unspecified: Secondary | ICD-10-CM

## 2015-08-27 DIAGNOSIS — E1165 Type 2 diabetes mellitus with hyperglycemia: Secondary | ICD-10-CM

## 2015-08-27 DIAGNOSIS — E785 Hyperlipidemia, unspecified: Secondary | ICD-10-CM

## 2015-08-27 DIAGNOSIS — E1129 Type 2 diabetes mellitus with other diabetic kidney complication: Secondary | ICD-10-CM

## 2015-08-28 LAB — LIPID PANEL
Chol/HDL Ratio: 4.3 ratio units (ref 0.0–5.0)
Cholesterol, Total: 168 mg/dL (ref 100–199)
HDL: 39 mg/dL — ABNORMAL LOW (ref >39)
LDL Calculated: 103 mg/dL — ABNORMAL HIGH (ref 0–99)
Triglycerides: 130 mg/dL (ref 0–149)
VLDL Cholesterol Cal: 26 mg/dL (ref 5–40)

## 2015-08-28 LAB — BASIC METABOLIC PANEL
BUN/Creatinine Ratio: 15 (ref 10–22)
BUN: 21 mg/dL (ref 8–27)
CO2: 24 mmol/L (ref 18–29)
Calcium: 10.1 mg/dL (ref 8.6–10.2)
Chloride: 96 mmol/L — ABNORMAL LOW (ref 97–106)
Creatinine, Ser: 1.4 mg/dL — ABNORMAL HIGH (ref 0.76–1.27)
GFR calc Af Amer: 59 mL/min/{1.73_m2} — ABNORMAL LOW (ref >59)
GFR calc non Af Amer: 51 mL/min/{1.73_m2} — ABNORMAL LOW (ref >59)
Glucose: 137 mg/dL — ABNORMAL HIGH (ref 65–99)
Potassium: 3.8 mmol/L (ref 3.5–5.2)
Sodium: 140 mmol/L (ref 136–144)

## 2015-08-28 LAB — CBC WITH DIFFERENTIAL/PLATELET
Basophils Absolute: 0 10*3/uL (ref 0.0–0.2)
Basos: 0 %
EOS (ABSOLUTE): 0.3 10*3/uL (ref 0.0–0.4)
Eos: 3 %
Hematocrit: 40 % (ref 37.5–51.0)
Hemoglobin: 13 g/dL (ref 12.6–17.7)
Immature Grans (Abs): 0 10*3/uL (ref 0.0–0.1)
Immature Granulocytes: 0 %
Lymphocytes Absolute: 5.5 10*3/uL — ABNORMAL HIGH (ref 0.7–3.1)
Lymphs: 57 %
MCH: 25.8 pg — ABNORMAL LOW (ref 26.6–33.0)
MCHC: 32.5 g/dL (ref 31.5–35.7)
MCV: 80 fL (ref 79–97)
Monocytes Absolute: 0.3 10*3/uL (ref 0.1–0.9)
Monocytes: 4 %
Neutrophils Absolute: 3.4 10*3/uL (ref 1.4–7.0)
Neutrophils: 36 %
Platelets: 224 10*3/uL (ref 150–379)
RBC: 5.03 x10E6/uL (ref 4.14–5.80)
RDW: 16.5 % — ABNORMAL HIGH (ref 12.3–15.4)
WBC: 9.6 10*3/uL (ref 3.4–10.8)

## 2015-08-28 LAB — HEMOGLOBIN A1C
Est. average glucose Bld gHb Est-mCnc: 151 mg/dL
Hgb A1c MFr Bld: 6.9 % — ABNORMAL HIGH (ref 4.8–5.6)

## 2015-08-31 ENCOUNTER — Encounter: Payer: Self-pay | Admitting: Internal Medicine

## 2015-08-31 ENCOUNTER — Ambulatory Visit (INDEPENDENT_AMBULATORY_CARE_PROVIDER_SITE_OTHER): Payer: Medicare Other | Admitting: Internal Medicine

## 2015-08-31 VITALS — BP 138/78 | HR 71 | Temp 97.5°F | Wt 296.0 lb

## 2015-08-31 DIAGNOSIS — E876 Hypokalemia: Secondary | ICD-10-CM | POA: Diagnosis not present

## 2015-08-31 DIAGNOSIS — E785 Hyperlipidemia, unspecified: Secondary | ICD-10-CM | POA: Diagnosis not present

## 2015-08-31 DIAGNOSIS — M159 Polyosteoarthritis, unspecified: Secondary | ICD-10-CM

## 2015-08-31 DIAGNOSIS — D509 Iron deficiency anemia, unspecified: Secondary | ICD-10-CM | POA: Diagnosis not present

## 2015-08-31 DIAGNOSIS — N182 Chronic kidney disease, stage 2 (mild): Secondary | ICD-10-CM | POA: Diagnosis not present

## 2015-08-31 DIAGNOSIS — E0822 Diabetes mellitus due to underlying condition with diabetic chronic kidney disease: Secondary | ICD-10-CM

## 2015-08-31 DIAGNOSIS — I10 Essential (primary) hypertension: Secondary | ICD-10-CM

## 2015-08-31 DIAGNOSIS — Z794 Long term (current) use of insulin: Secondary | ICD-10-CM | POA: Diagnosis not present

## 2015-08-31 NOTE — Progress Notes (Signed)
Patient ID: Adrian Matthews, male   DOB: 04/07/1946, 69 y.o.   MRN: [id number]   Location: [facility] Provider: Rexene Edison. Mariea Clonts, D.O., C.M.D.  Goals of Care: Advanced Directive information Does patient have an advance directive?: Yes  Chief Complaint  Patient presents with  . Medical Management of Chronic Issues    hypokalemia, obesity, chcolesterol, CKD, blood pressure, blood sugar    HPI: Patient is a 69 y.o. male seen in the office today for med mgt of chronic diseases.    DMII:  hba1c improved to 6.9.  Continues on lantus with humalog meal coverage.  Is on ARB and statin.    HL:  Continues lipitor.  LDL came up for some reason (probably fried food).    He does well and then ventures off to fried foods after a little while.  He had a switcheroo a couple weekends of football.  Is still doing his gym routine and they have the air conditioning running.  In the water, it is too cold.  Was thinking of going upstairs on a machine.  Down 10 lbs in 2 mos.    CKD:  Drinks plenty of water.  Avoids NSAIDs.  Only has to see Dr. Posey Pronto once a year.    HTN:  bp is well controlled today with arb, amlodipine, chlorthalidone.    Fingers were stiff yesterday and in his palms.  Took a tylenol arthritis and keeps his home at a high temp 80 degrees.  Got 2 humidifiers for dry cough.  Review of Systems:  Review of Systems  Constitutional: Negative for fever, chills and malaise/fatigue.  HENT: Negative for hearing loss.   Eyes: Negative for blurred vision.  Respiratory: Negative for shortness of breath.   Cardiovascular: Negative for chest pain and leg swelling.  Gastrointestinal: Negative for abdominal pain, constipation, blood in stool and melena.  Genitourinary: Positive for frequency. Negative for dysuria and urgency.  Musculoskeletal: Positive for joint pain. Negative for falls.       Hands, knees  Skin: Negative for rash.  Neurological: Negative for dizziness, loss of consciousness,  weakness and headaches.  Endo/Heme/Allergies: Does not bruise/bleed easily.  Psychiatric/Behavioral: Negative for depression and memory loss. The patient is not nervous/anxious.     Past Medical History  Diagnosis Date  . DM (diabetes mellitus) ([place])   . Hypertension   . Hyperlipidemia   . Osteoarthrosis, unspecified whether generalized or localized, lower leg   . Primary localized osteoarthrosis, unspecified site   . Asymptomatic varicose veins   . New daily persistent headache   . Unspecified hereditary and idiopathic peripheral neuropathy   . Chronic kidney disease   . Monoclonal paraproteinemia   . Gout, unspecified   . Hypercalcemia   . Hyperosmolality and/or hypernatremia   . Diaphragmatic hernia without mention of obstruction or gangrene   . Unspecified disorder of kidney and ureter   . Other malaise and fatigue   . GERD (gastroesophageal reflux disease)     Past Surgical History  Procedure Laterality Date  . Carpal tunnel release  2007  . Colonoscopy N/A 06/06/2013    Procedure: COLONOSCOPY;  Surgeon: Cleotis Nipper, MD;  Location: [facility] ENDOSCOPY;  Service: Endoscopy;  Laterality: N/A;    No Known Allergies    Medication List       This list is accurate as of: 08/31/15  8:42 AM.  Always use your most recent med list.  amLODipine 10 MG tablet  Commonly known as:  NORVASC  TAKE 1 TABLET DAILY FOR BLOOD PRESSURE     atorvastatin 40 MG tablet  Commonly known as:  LIPITOR  TAKE 1 TABLET DAILY     chlorthalidone 25 MG tablet  Commonly known as:  HYGROTON  Take one tablet by mouth once daily for blood pressure.     freestyle lancets  Check blood sugar 3 times daily DX: 250.00 (Freestyle Lite Machine)     FREESTYLE LITE test strip  Generic drug:  glucose blood  CHECK BLOOD SUGAR THREE TIMES A DAY     Insulin Glargine 100 UNIT/ML Solostar Pen  Commonly known as:  LANTUS SOLOSTAR  Inject 60 Units into the skin daily at 10 pm.     insulin  lispro 100 UNIT/ML KiwkPen  Commonly known as:  HUMALOG KWIKPEN  Inject 22 units before breakfast, 26 units before lunch & supper - plus sliding scale.     losartan 100 MG tablet  Commonly known as:  COZAAR  TAKE 1 TABLET DAILY     NEXIUM 40 MG capsule  Generic drug:  esomeprazole  TAKE 1 CAPSULE DAILY     potassium chloride SA 20 MEQ tablet  Commonly known as:  K-DUR,KLOR-CON  Take 1 tablet (20 mEq total) by mouth 2 (two) times daily.     SURE COMFORT PEN NEEDLES 32G X 4 MM Misc  Generic drug:  Insulin Pen Needle  USE FOUR TIMES A DAY BEFORE MEALS     terbinafine 250 MG tablet  Commonly known as:  LAMISIL  Take 1 tablet (250 mg total) by mouth daily.     traMADol 50 MG tablet  Commonly known as:  ULTRAM  Take 1 tablet (50 mg total) by mouth every 8 (eight) hours as needed for moderate pain.        Health Maintenance  Topic Date Due  . Hepatitis C Screening  1946-03-23  . FOOT EXAM  04/21/2015  . INFLUENZA VACCINE  05/14/2015  . OPHTHALMOLOGY EXAM  06/08/2015  . HEMOGLOBIN A1C  02/24/2016  . URINE MICROALBUMIN  06/27/2016  . TETANUS/TDAP  04/13/2023  . COLONOSCOPY  06/07/2023  . ZOSTAVAX  Completed  . PNA vac Low Risk Adult  Completed    Physical Exam: Filed Vitals:   08/31/15 0830  BP: 138/78  Pulse: 71  Temp: 97.5 F (36.4 C)  TempSrc: Oral  Weight: 296 lb (134.265 kg)  SpO2: 97%   Body mass index is 41.3 kg/(m^2). Physical Exam  Constitutional: He is oriented to person, place, and time. He appears well-developed and well-nourished.  Cardiovascular: Normal rate, regular rhythm, normal heart sounds and intact distal pulses.   Pulmonary/Chest: Effort normal and breath sounds normal. No respiratory distress.  Abdominal: Soft. Bowel sounds are normal.  Musculoskeletal: Normal range of motion.  Neurological: He is alert and oriented to person, place, and time.  Skin: Skin is warm and dry.  Psychiatric: He has a normal mood and affect.    Labs  reviewed: Basic Metabolic Panel:  Recent Labs  03/29/15 0812 06/28/15 0804 08/27/15 0905  NA 138 137 140  K 3.4* 3.2* 3.8  CL 97 93* 96*  CO2 24 26 24   GLUCOSE 132* 72 137*  BUN 19 22 21   CREATININE 1.46* 1.35* 1.40*  CALCIUM 9.6 9.9 10.1   Liver Function Tests:  Recent Labs  12/19/14 0833 03/29/15 0812 06/28/15 0804  AST 18 17 19   ALT 14 12 11   ALKPHOS 90  84 76  BILITOT <0.2 0.2 0.2  PROT 7.0 6.7 6.9  ALBUMIN 4.1 3.8 3.9   No results for input(s): LIPASE, AMYLASE in the last 8760 hours. No results for input(s): AMMONIA in the last 8760 hours. CBC:  Recent Labs  08/27/15 0905  WBC 9.6  NEUTROABS 3.4  HCT 40.0   Lipid Panel:  Recent Labs  12/19/14 0833 08/27/15 0905  CHOL 155 168  HDL 43 39*  LDLCALC 95 103*  TRIG 85 130  CHOLHDL 3.6 4.3   Lab Results  Component Value Date   HGBA1C 6.9* 08/27/2015   Reviewed renal note.  Assessment/Plan 1. Diabetes mellitus due to underlying condition, controlled, with stage 2 chronic kidney disease, with long-term current use of insulin (HCC) -cont lantus with humalog meal coverage as per Tivis Ringer -cont ARB and statin therapy -cont diet and exercise  2. Anemia, iron deficiency -cont fusion capsules every other day (he says he had been taking iron and was just changed to this but does not take daily only every other day) -h/h now wnl--renal following iron levels  3. Essential hypertension -bp well controlled with diet, exercise and current bp regimen  4. Hypokalemia -cont kcl 20meq daily -was wnl on labs this month  5. Hyperlipidemia LDL goal <70 -lipids bumped up slightly since last time due to some fried foods while watching football  6. Obesity, Class III, BMI 40-49.9 (morbid obesity) (HCC) -[facility] and [facility] diet--avoiding sweets and fried foods  7. Generalized osteoarthritis of multiple sites -primarily his hands and knees  Labs/tests ordered: has labs before visit with  [place] in Jan  Next appt:  Jan with [facility], 6 mos with me for annual wellness/physical  Junelle Hashemi L. Koree Staheli, D.O. [facility] [address], [place] [address] Cell Phone (Mon-Fri 8am-5pm):  [phone] On Call:  [phone] & follow prompts after 5pm & weekends Office Phone:  [phone] Office Fax:  [phone]

## 2015-10-13 ENCOUNTER — Other Ambulatory Visit: Payer: Self-pay | Admitting: Nurse Practitioner

## 2015-10-14 HISTORY — PX: TUMOR REMOVAL: SHX12

## 2015-10-17 ENCOUNTER — Ambulatory Visit: Payer: Medicare Other

## 2015-10-18 ENCOUNTER — Ambulatory Visit: Payer: Medicare Other

## 2015-10-19 ENCOUNTER — Other Ambulatory Visit: Payer: Medicare Other

## 2015-10-19 DIAGNOSIS — E1129 Type 2 diabetes mellitus with other diabetic kidney complication: Secondary | ICD-10-CM | POA: Diagnosis not present

## 2015-10-19 DIAGNOSIS — IMO0002 Reserved for concepts with insufficient information to code with codable children: Secondary | ICD-10-CM

## 2015-10-19 DIAGNOSIS — E1165 Type 2 diabetes mellitus with hyperglycemia: Principal | ICD-10-CM

## 2015-10-20 LAB — COMPREHENSIVE METABOLIC PANEL
ALT: 23 IU/L (ref 0–44)
AST: 25 IU/L (ref 0–40)
Albumin/Globulin Ratio: 1.3 (ref 1.1–2.5)
Albumin: 4 g/dL (ref 3.6–4.8)
Alkaline Phosphatase: 81 IU/L (ref 39–117)
BUN/Creatinine Ratio: 13 (ref 10–22)
BUN: 15 mg/dL (ref 8–27)
Bilirubin Total: 0.3 mg/dL (ref 0.0–1.2)
CO2: 25 mmol/L (ref 18–29)
Calcium: 10.3 mg/dL — ABNORMAL HIGH (ref 8.6–10.2)
Chloride: 94 mmol/L — ABNORMAL LOW (ref 96–106)
Creatinine, Ser: 1.13 mg/dL (ref 0.76–1.27)
GFR calc Af Amer: 77 mL/min/{1.73_m2} (ref >59)
GFR calc non Af Amer: 66 mL/min/{1.73_m2} (ref >59)
Globulin, Total: 3.1 g/dL (ref 1.5–4.5)
Glucose: 133 mg/dL — ABNORMAL HIGH (ref 65–99)
Potassium: 3.4 mmol/L — ABNORMAL LOW (ref 3.5–5.2)
Sodium: 138 mmol/L (ref 134–144)
Total Protein: 7.1 g/dL (ref 6.0–8.5)

## 2015-10-20 LAB — HEMOGLOBIN A1C
Est. average glucose Bld gHb Est-mCnc: 146 mg/dL
Hgb A1c MFr Bld: 6.7 % — ABNORMAL HIGH (ref 4.8–5.6)

## 2015-10-22 ENCOUNTER — Ambulatory Visit: Payer: Medicare Other | Admitting: Pharmacotherapy

## 2015-10-22 ENCOUNTER — Ambulatory Visit: Payer: Medicare Other | Admitting: Internal Medicine

## 2015-11-12 ENCOUNTER — Ambulatory Visit: Payer: Medicare Other | Admitting: Pharmacotherapy

## 2015-12-05 ENCOUNTER — Other Ambulatory Visit: Payer: Self-pay | Admitting: Internal Medicine

## 2015-12-17 ENCOUNTER — Ambulatory Visit (INDEPENDENT_AMBULATORY_CARE_PROVIDER_SITE_OTHER): Payer: Medicare Other | Admitting: Pharmacotherapy

## 2015-12-17 ENCOUNTER — Encounter: Payer: Self-pay | Admitting: Pharmacotherapy

## 2015-12-17 VITALS — BP 122/70 | HR 62 | Temp 98.2°F | Wt 298.0 lb

## 2015-12-17 DIAGNOSIS — N182 Chronic kidney disease, stage 2 (mild): Secondary | ICD-10-CM

## 2015-12-17 DIAGNOSIS — Z794 Long term (current) use of insulin: Secondary | ICD-10-CM

## 2015-12-17 DIAGNOSIS — E0822 Diabetes mellitus due to underlying condition with diabetic chronic kidney disease: Secondary | ICD-10-CM | POA: Diagnosis not present

## 2015-12-17 DIAGNOSIS — I10 Essential (primary) hypertension: Secondary | ICD-10-CM | POA: Diagnosis not present

## 2015-12-17 MED ORDER — INSULIN GLARGINE 100 UNIT/ML SOLOSTAR PEN: 60.0000 [IU] | PEN_INJECTOR | Freq: Every day | SUBCUTANEOUS | Status: DC

## 2015-12-17 NOTE — Patient Instructions (Signed)
Keep up the great work!

## 2015-12-17 NOTE — Progress Notes (Signed)
  Subjective:    Adrian Matthews is a 70 y.o.African American male who presents for follow-up of Type 2 diabetes mellitus.   Last A1C was 6.7%.  He is tired today, but usually feels good. Average BG:  149m/dl Hypoglycemia x 1 (lowest BG: 650mdl)  Trying to make healthy food choices.  Eating more salads. 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 Denies problems with vision. Complains of arthritis in right knee.  Denies problems with feet.  Some tingling, but not often. Denies peripheral edema. Nocturia 3 times per night.  Lantus 60 units daily Humalog 20-22 units with breakfast, 26 units with lunch & supper.  Review of Systems A comprehensive review of systems was negative except for: Constitutional: positive for feels tired Respiratory: positive for cough Genitourinary: positive for nocturia Musculoskeletal: positive for arthralgias    Objective:    BP 122/70 mmHg  Pulse 62  Temp(Src) 98.2 F (36.8 C) (Oral)  Wt 298 lb (135.172 kg)  SpO2 98%  General:  alert, cooperative and no distress  Oropharynx: normal findings: lips normal without lesions and gums healthy   Eyes:  negative findings: lids and lashes normal and conjunctivae and sclerae normal   Ears:  external ears normal        Lung: clear to auscultation bilaterally  Heart:  regular rate and rhythm     Extremities: extremities normal, atraumatic, no cyanosis or edema  Skin: warm and dry, no hyperpigmentation, vitiligo, or suspicious lesions     Neuro: mental status, speech normal, alert and oriented x3 and gait and station normal   Lab Review GLUCOSE (mg/dL)  Date Value  10/19/2015 133*  08/27/2015 137*  06/28/2015 72   GLUCOSE, BLD (mg/dL)  Date Value  06/05/2013 112*  06/04/2013 93  02/17/2011 175*   CO2 (mmol/L)  Date Value  10/19/2015 25  08/27/2015 24  06/28/2015 26   BUN (mg/dL)  Date Value  10/19/2015 15  08/27/2015 21  06/28/2015 22  06/05/2013 14  06/04/2013 18  02/17/2011  25*   CREATININE (mg/dL)  Date Value  12/07/2009 1.87*  08/22/2009 1.85*   CREATININE, SER (mg/dL)  Date Value  10/19/2015 1.13  08/27/2015 1.40*  06/28/2015 1.35*       Assessment:    Diabetes Mellitus type II, under excellent control.  A1C <7% with minimal hypoglycemia BP at goal <140/90   Plan:    1.  Rx changes: none 2.  Continue Lantus 60 units daily. 3.  Continue Humalog 22 units breakfast, 26 units lunch & supper. 4.  Counseled on stress mgmt. 5.  Counseled on nutrition goals. 6.  Counseled on benefit of routine exercise.  Goal is 30-45 minutes 5 x week. 7.  BP at goal <140/90 8.  RTC in 3 months

## 2015-12-23 ENCOUNTER — Other Ambulatory Visit: Payer: Self-pay | Admitting: Nurse Practitioner

## 2015-12-23 ENCOUNTER — Other Ambulatory Visit: Payer: Self-pay | Admitting: Internal Medicine

## 2015-12-31 DIAGNOSIS — N189 Chronic kidney disease, unspecified: Secondary | ICD-10-CM | POA: Diagnosis not present

## 2015-12-31 DIAGNOSIS — M1991 Primary osteoarthritis, unspecified site: Secondary | ICD-10-CM | POA: Insufficient documentation

## 2015-12-31 DIAGNOSIS — Z794 Long term (current) use of insulin: Secondary | ICD-10-CM | POA: Diagnosis not present

## 2015-12-31 DIAGNOSIS — K219 Gastro-esophageal reflux disease without esophagitis: Secondary | ICD-10-CM | POA: Diagnosis not present

## 2015-12-31 DIAGNOSIS — G4489 Other headache syndrome: Secondary | ICD-10-CM | POA: Diagnosis not present

## 2015-12-31 DIAGNOSIS — Z862 Personal history of diseases of the blood and blood-forming organs and certain disorders involving the immune mechanism: Secondary | ICD-10-CM | POA: Insufficient documentation

## 2015-12-31 DIAGNOSIS — Z79899 Other long term (current) drug therapy: Secondary | ICD-10-CM | POA: Diagnosis not present

## 2015-12-31 DIAGNOSIS — R51 Headache: Secondary | ICD-10-CM | POA: Diagnosis present

## 2015-12-31 DIAGNOSIS — I129 Hypertensive chronic kidney disease with stage 1 through stage 4 chronic kidney disease, or unspecified chronic kidney disease: Secondary | ICD-10-CM | POA: Insufficient documentation

## 2015-12-31 DIAGNOSIS — E119 Type 2 diabetes mellitus without complications: Secondary | ICD-10-CM | POA: Insufficient documentation

## 2015-12-31 DIAGNOSIS — E860 Dehydration: Secondary | ICD-10-CM | POA: Insufficient documentation

## 2015-12-31 DIAGNOSIS — E785 Hyperlipidemia, unspecified: Secondary | ICD-10-CM | POA: Insufficient documentation

## 2016-01-01 ENCOUNTER — Encounter (HOSPITAL_COMMUNITY): Payer: Self-pay | Admitting: Emergency Medicine

## 2016-01-01 ENCOUNTER — Emergency Department (HOSPITAL_COMMUNITY): Payer: Medicare Other

## 2016-01-01 ENCOUNTER — Emergency Department (HOSPITAL_COMMUNITY)
Admission: EM | Admit: 2016-01-01 | Discharge: 2016-01-01 | Disposition: A | Discharge status: Home / Self Care | Payer: Medicare Other | Attending: Emergency Medicine | Admitting: Emergency Medicine

## 2016-01-01 DIAGNOSIS — G4489 Other headache syndrome: Secondary | ICD-10-CM

## 2016-01-01 DIAGNOSIS — R51 Headache: Secondary | ICD-10-CM | POA: Diagnosis not present

## 2016-01-01 DIAGNOSIS — E860 Dehydration: Secondary | ICD-10-CM

## 2016-01-01 LAB — BASIC METABOLIC PANEL
ANION GAP: 15 (ref 5–15)
BUN: 12 mg/dL (ref 6–20)
CHLORIDE: 89 mmol/L — AB (ref 101–111)
CO2: 26 mmol/L (ref 22–32)
Calcium: 9.9 mg/dL (ref 8.9–10.3)
Creatinine, Ser: 1.19 mg/dL (ref 0.61–1.24)
GFR calc Af Amer: >60 mL/min (ref >60)
GLUCOSE: 166 mg/dL — AB (ref 65–99)
POTASSIUM: 3 mmol/L — AB (ref 3.5–5.1)
Sodium: 130 mmol/L — ABNORMAL LOW (ref 135–145)

## 2016-01-01 LAB — CBC
HEMATOCRIT: 36.4 % — AB (ref 39.0–52.0)
HEMOGLOBIN: 12.2 g/dL — AB (ref 13.0–17.0)
MCH: 26.1 pg (ref 26.0–34.0)
MCHC: 33.5 g/dL (ref 30.0–36.0)
MCV: 77.9 fL — AB (ref 78.0–100.0)
Platelets: 212 10*3/uL (ref 150–400)
RBC: 4.67 MIL/uL (ref 4.22–5.81)
RDW: 14.1 % (ref 11.5–15.5)
WBC: 10.3 10*3/uL (ref 4.0–10.5)

## 2016-01-01 MED ORDER — OXYCODONE-ACETAMINOPHEN 5-325 MG PO TABS
ORAL_TABLET | ORAL | Status: AC
  Filled 2016-01-01: qty 1

## 2016-01-01 MED ORDER — DIPHENHYDRAMINE HCL 50 MG/ML IJ SOLN
25.0000 mg | Freq: Once | INTRAMUSCULAR | Status: AC
  Administered 2016-01-01: 25 mg via INTRAVENOUS
  Filled 2016-01-01: qty 1

## 2016-01-01 MED ORDER — SODIUM CHLORIDE 0.9 % IV BOLUS (SEPSIS)
1000.0000 mL | Freq: Once | INTRAVENOUS | Status: AC
  Administered 2016-01-01: 1000 mL via INTRAVENOUS

## 2016-01-01 MED ORDER — OXYCODONE-ACETAMINOPHEN 5-325 MG PO TABS
1.0000 | ORAL_TABLET | ORAL | Status: DC | PRN
Start: 2016-01-01 — End: 2016-01-01
  Administered 2016-01-01: 1 via ORAL

## 2016-01-01 MED ORDER — METOCLOPRAMIDE HCL 5 MG/ML IJ SOLN
10.0000 mg | Freq: Once | INTRAMUSCULAR | Status: AC
  Administered 2016-01-01: 10 mg via INTRAVENOUS
  Filled 2016-01-01: qty 2

## 2016-01-01 NOTE — Discharge Instructions (Signed)

## 2016-01-01 NOTE — ED Provider Notes (Signed)
CSN: [id number]     Arrival date & time 12/31/15  2349 History   First MD Initiated Contact with Patient 01/01/16 0435     Chief Complaint  Patient presents with  . Headache    Patient is a 70 y.o. male presenting with headaches. The history is provided by the patient.  Headache Pain location:  L parietal Quality:  Sharp Onset quality:  Gradual Duration:  5 days Timing:  Constant Progression:  Worsening Chronicity:  New Relieved by:  Nothing Worsened by:  Nothing Associated symptoms: no blurred vision, no dizziness, no eye pain, no fever, no focal weakness, no numbness, no vomiting and no weakness   Risk factors comment:  Denies h/o stroke or aneurysm Patient reports left sided HA for past 5 days Gradual in onset, no trauma reported No fever No visual changes No focal weakness   Past Medical History  Diagnosis Date  . DM (diabetes mellitus) ([place])   . Hypertension   . Hyperlipidemia   . Osteoarthrosis, unspecified whether generalized or localized, lower leg   . Primary localized osteoarthrosis, unspecified site   . Asymptomatic varicose veins   . New daily persistent headache   . Unspecified hereditary and idiopathic peripheral neuropathy   . Chronic kidney disease   . Monoclonal paraproteinemia   . Gout, unspecified   . Hypercalcemia   . Hyperosmolality and/or hypernatremia   . Diaphragmatic hernia without mention of obstruction or gangrene   . Unspecified disorder of kidney and ureter   . Other malaise and fatigue   . GERD (gastroesophageal reflux disease)    Past Surgical History  Procedure Laterality Date  . Carpal tunnel release  2007  . Colonoscopy N/A 06/06/2013    Procedure: COLONOSCOPY;  Surgeon: Cleotis Nipper, MD;  Location: [facility] ENDOSCOPY;  Service: Endoscopy;  Laterality: N/A;   Family History  Problem Relation Age of Onset  . Hypertension Sister   . Hypertension Brother   . Diabetes Brother   . Hypertension Brother   . Hypertension Sister     Social History  Substance Use Topics  . Smoking status: Never Smoker   . Smokeless tobacco: Never Used  . Alcohol Use: No    Review of Systems  Constitutional: Negative for fever.  Eyes: Negative for blurred vision and pain.  Gastrointestinal: Negative for vomiting.  Neurological: Positive for headaches. Negative for dizziness, focal weakness, weakness and numbness.  All other systems reviewed and are negative.     Allergies  Review of patient's allergies indicates no known allergies.  Home Medications   Prior to Admission medications   Medication Sig Start Date End Date Taking? Authorizing Provider  amLODipine (NORVASC) 10 MG tablet TAKE 1 TABLET DAILY FOR BLOOD PRESSURE 12/24/15  Yes Tiffany L Reed, DO  atorvastatin (LIPITOR) 40 MG tablet TAKE 1 TABLET DAILY 12/05/15  Yes Tiffany L Reed, DO  chlorthalidone (HYGROTON) 25 MG tablet TAKE 1 TABLET DAILY FOR BLOOD PRESSURE 12/05/15  Yes Tiffany L Reed, DO  FREESTYLE LITE test strip CHECK BLOOD SUGAR THREE TIMES A DAY 12/24/15  Yes Tiffany L Reed, DO  Insulin Glargine (LANTUS SOLOSTAR) 100 UNIT/ML Solostar Pen Inject 60 Units into the skin daily at 10 pm. 12/17/15  Yes Tivis Ringer, RPH-CPP  insulin lispro (HUMALOG KWIKPEN) 100 UNIT/ML KiwkPen Inject 22 units before breakfast, 26 units before lunch & supper - plus sliding scale. 03/06/15  Yes Tiffany L Reed, DO  Iron-FA-B Cmp-C-Biot-Probiotic (FUSION PLUS) CAPS Take 1 capsule by mouth every other day. 07/26/15  Yes Historical Provider, MD  Lancets (FREESTYLE) lancets Check blood sugar 3 times daily DX: 250.00 (Freestyle Lite Machine) 04/20/14  Yes Tiffany L Reed, DO  losartan (COZAAR) 100 MG tablet TAKE 1 TABLET DAILY 02/19/15  Yes Tiffany L Reed, DO  NEXIUM 40 MG capsule TAKE 1 CAPSULE DAILY 10/16/15  Yes Tiffany L Reed, DO  potassium chloride SA (K-DUR,KLOR-CON) 20 MEQ tablet Take 1 tablet (20 mEq total) by mouth 2 (two) times daily. 07/02/15  Yes Tivis Ringer, RPH-CPP  SURE COMFORT PEN  NEEDLES 32G X 4 MM MISC USE FOUR TIMES A DAY BEFORE MEALS 07/16/15  Yes Tiffany L Reed, DO  traMADol (ULTRAM) 50 MG tablet Take 1 tablet (50 mg total) by mouth every 8 (eight) hours as needed for moderate pain. 11/10/14  Yes Monica Carter, DO   BP 147/75 mmHg  Pulse 84  Temp(Src) 98.7 F (37.1 C) (Oral)  Resp 14  Ht 5\' 11"  (1.803 m)  Wt 136.986 kg  BMI 42.14 kg/m2  SpO2 99% Physical Exam CONSTITUTIONAL: Well developed/well nourished HEAD: Normocephalic/atraumatic, no signs of trauma, no bruising EYES: EOMI/PERRL, no nystagmus, no ptosis  ENMT: Mucous membranes moist NECK: supple no meningeal signs, no bruits SPINE/BACK:entire spine nontender CV: S1/S2 noted, no murmurs/rubs/gallops noted LUNGS: Lungs are clear to auscultation bilaterally, no apparent distress ABDOMEN: soft, nontender, no rebound or guarding GU:no cva tenderness NEURO:Awake/alert, face symmetric, no arm or leg drift is noted Equal 5/5 strength with shoulder abduction, elbow flex/extension, wrist flex/extension in upper extremities and equal hand grips bilaterally Equal 5/5 strength with hip flexion,knee flex/extension, foot dorsi/plantar flexion Cranial nerves 3/4/5/6/04/20/09/11/12 tested and intact No past pointing Sensation to light touch intact in all extremities EXTREMITIES: pulses normal, full ROM SKIN: warm, color normal PSYCH: no abnormalities of mood noted, alert and oriented to situation   ED Course  Procedures  5:56 AM Pt reports having this HA previously He declines CT imaging but is requesting IV meds  7:57 AM After meds, pt reports not feeling improved and he requested CT scan CT head negative for acute disease Pt ambulatory He is no distress History not suggestive of SAH No focal neuro deficits to suggest occult stroke No signs of meningitis Will d/c home IV fluids given for mild dehydration  Labs Review Labs Reviewed  BASIC METABOLIC PANEL - Abnormal; Notable for the following:     Sodium 130 (*)    Potassium 3.0 (*)    Chloride 89 (*)    Glucose, Bld 166 (*)    All other components within normal limits  CBC - Abnormal; Notable for the following:    Hemoglobin 12.2 (*)    HCT 36.4 (*)    MCV 77.9 (*)    All other components within normal limits    I have personally reviewed and evaluated these lab results as part of my medical decision-making.  Medications  oxyCODONE-acetaminophen (PERCOCET/ROXICET) 5-325 MG per tablet 1 tablet (1 tablet Oral Given 01/01/16 0006)  metoCLOPramide (REGLAN) injection 10 mg (10 mg Intravenous Given 01/01/16 0639)  diphenhydrAMINE (BENADRYL) injection 25 mg (25 mg Intravenous Given 01/01/16 0639)  sodium chloride 0.9 % bolus 1,000 mL (0 mLs Intravenous Stopped 01/01/16 0754)     MDM   Final diagnoses:  Other headache syndrome  Dehydration    Nursing notes including past medical history and social history reviewed and considered in documentation Labs/vital reviewed myself and considered during evaluation     Ripley Fraise, MD 01/01/16 [phone]

## 2016-01-01 NOTE — ED Notes (Signed)
Pt. reports persistent left side headache onset Thursday last week , denies head injury , no nausea or blurred vision , no fever or chills.

## 2016-01-21 ENCOUNTER — Encounter: Payer: Self-pay | Admitting: Internal Medicine

## 2016-01-31 ENCOUNTER — Encounter: Payer: Self-pay | Admitting: Internal Medicine

## 2016-01-31 ENCOUNTER — Ambulatory Visit (INDEPENDENT_AMBULATORY_CARE_PROVIDER_SITE_OTHER): Payer: Medicare Other | Admitting: Internal Medicine

## 2016-01-31 VITALS — BP 138/78 | HR 78 | Temp 98.1°F | Resp 20 | Ht 71.0 in | Wt 297.4 lb

## 2016-01-31 DIAGNOSIS — E0822 Diabetes mellitus due to underlying condition with diabetic chronic kidney disease: Secondary | ICD-10-CM

## 2016-01-31 DIAGNOSIS — N182 Chronic kidney disease, stage 2 (mild): Secondary | ICD-10-CM | POA: Diagnosis not present

## 2016-01-31 DIAGNOSIS — Z794 Long term (current) use of insulin: Secondary | ICD-10-CM

## 2016-01-31 DIAGNOSIS — E785 Hyperlipidemia, unspecified: Secondary | ICD-10-CM

## 2016-01-31 DIAGNOSIS — M159 Polyosteoarthritis, unspecified: Secondary | ICD-10-CM | POA: Diagnosis not present

## 2016-01-31 DIAGNOSIS — R51 Headache: Secondary | ICD-10-CM

## 2016-01-31 DIAGNOSIS — I10 Essential (primary) hypertension: Secondary | ICD-10-CM | POA: Diagnosis not present

## 2016-01-31 DIAGNOSIS — R519 Headache, unspecified: Secondary | ICD-10-CM

## 2016-01-31 MED ORDER — ZOLMITRIPTAN 2.5 MG PO TABS: 2.5000 mg | ORAL_TABLET | Freq: Once | ORAL | Status: DC

## 2016-01-31 NOTE — Patient Instructions (Signed)
Try zomig for your migraine--one right away, then another in 2 hrs if headache not resolved.  Call me tomorrow if you still have a headache and I will put in a neurology referral to headache clinic.

## 2016-01-31 NOTE — Progress Notes (Signed)
Patient ID: Adrian Matthews, male   DOB: 07-26-1946, 70 y.o.   MRN: [id number]   Location:  [facility] clinic Provider:  Mikelle Myrick L. Mariea Clonts, D.O., C.M.D.  Code Status: full code Goals of Care:  Advanced Directives 01/01/2016  Does patient have an advance directive? No  Type of Advance Directive -  Does patient want to make changes to advanced directive? -  Copy of advanced directive(s) in chart? -   Chief Complaint  Patient presents with  . Medical Management of Chronic Issues    HA(left-side) nauseated in the am, coughing alot,    HPI: Patient is a 70 y.o. male seen today for medical management of chronic diseases.    Headaches:  Had been going on for a while already.  Went to ED 3/21.  He left there with the same headache.  He had labs and a CT and was sent to see me and get a neuro referral.  Doesn't like taking anything.  Thought maybe allergies--tried 1/2 benadryl and can't take them.  Will sleep for a little with the headaches.  Then gets to coughing and congestion.  gets up and sits in the recliner.  Never goes away.  Tried to go to gym and that didn't get rid of it.  Sometimes can sleep, sometimes nauseated.   Not using tramadol anymore which he had for arthritis pains.  Headache is on left side of his head.  It's in the area where he lays in the back of his head--will then hurt on the other side and throb.  When went to ED, he was in tears and stayed terrible for 2 wks, then eased off to a chronic throb.    Says coughs with dry air.  Is getting a bigger humidifier.  Not very congested.  No visual changes.  Slight smells make him feel nauseous.  If cooks something and it get into the bedroom, it bothers him.  Has an air purifier but smells come back.  No weakness or numbness or tingling.  No speech difficulty.  He makes himself function--going to grocery store and walks the dog.  Can't stand the chlorine smell and excessive perfumes at gym.      Past Medical History  Diagnosis Date  . DM  (diabetes mellitus) ([place])   . Hypertension   . Hyperlipidemia   . Osteoarthrosis, unspecified whether generalized or localized, lower leg   . Primary localized osteoarthrosis, unspecified site   . Asymptomatic varicose veins   . New daily persistent headache   . Unspecified hereditary and idiopathic peripheral neuropathy   . Chronic kidney disease   . Monoclonal paraproteinemia   . Gout, unspecified   . Hypercalcemia   . Hyperosmolality and/or hypernatremia   . Diaphragmatic hernia without mention of obstruction or gangrene   . Unspecified disorder of kidney and ureter   . Other malaise and fatigue   . GERD (gastroesophageal reflux disease)     Past Surgical History  Procedure Laterality Date  . Carpal tunnel release  2007  . Colonoscopy N/A 06/06/2013    Procedure: COLONOSCOPY;  Surgeon: Cleotis Nipper, MD;  Location: [facility] ENDOSCOPY;  Service: Endoscopy;  Laterality: N/A;    No Known Allergies    Medication List       This list is accurate as of: 01/31/16  8:35 AM.  Always use your most recent med list.               amLODipine 10 MG tablet  Commonly known as:  NORVASC  TAKE 1 TABLET DAILY FOR BLOOD PRESSURE     atorvastatin 40 MG tablet  Commonly known as:  LIPITOR  TAKE 1 TABLET DAILY     chlorthalidone 25 MG tablet  Commonly known as:  HYGROTON  TAKE 1 TABLET DAILY FOR BLOOD PRESSURE     freestyle lancets  Check blood sugar 3 times daily DX: 250.00 (Freestyle Lite Machine)     FREESTYLE LITE test strip  Generic drug:  glucose blood  CHECK BLOOD SUGAR THREE TIMES A DAY     FUSION PLUS Caps  Take 1 capsule by mouth every other day.     Insulin Glargine 100 UNIT/ML Solostar Pen  Commonly known as:  LANTUS SOLOSTAR  Inject 60 Units into the skin daily at 10 pm.     insulin lispro 100 UNIT/ML KiwkPen  Commonly known as:  HUMALOG KWIKPEN  Inject 22 units before breakfast, 26 units before lunch & supper - plus sliding scale.     losartan 100 MG tablet    Commonly known as:  COZAAR  TAKE 1 TABLET DAILY     NEXIUM 40 MG capsule  Generic drug:  esomeprazole  TAKE 1 CAPSULE DAILY     potassium chloride SA 20 MEQ tablet  Commonly known as:  K-DUR,KLOR-CON  Take 1 tablet (20 mEq total) by mouth 2 (two) times daily.     SURE COMFORT PEN NEEDLES 32G X 4 MM Misc  Generic drug:  Insulin Pen Needle  USE FOUR TIMES A DAY BEFORE MEALS     traMADol 50 MG tablet  Commonly known as:  ULTRAM  Take 1 tablet (50 mg total) by mouth every 8 (eight) hours as needed for moderate pain.        Review of Systems:  ROS  Health Maintenance  Topic Date Due  . Hepatitis C Screening  1946-07-04  . FOOT EXAM  04/21/2015  . OPHTHALMOLOGY EXAM  06/08/2015  . HEMOGLOBIN A1C  04/17/2016  . INFLUENZA VACCINE  05/13/2016  . TETANUS/TDAP  04/13/2023  . COLONOSCOPY  06/07/2023  . ZOSTAVAX  Completed  . PNA vac Low Risk Adult  Completed    Physical Exam: Filed Vitals:   01/31/16 0805  BP: 138/78  Pulse: 78  Temp: 98.1 F (36.7 C)  TempSrc: Oral  Resp: 20  Height: 5\' 11"  (1.803 m)  Weight: 297 lb 6.4 oz (134.9 kg)  SpO2: 98%   Body mass index is 41.5 kg/(m^2). Physical Exam  Labs reviewed: Basic Metabolic Panel:  Recent Labs  08/27/15 0905 10/19/15 0810 01/01/16 0014  NA 140 138 130*  K 3.8 3.4* 3.0*  CL 96* 94* 89*  CO2 24 25 26   GLUCOSE 137* 133* 166*  BUN 21 15 12   CREATININE 1.40* 1.13 1.19  CALCIUM 10.1 10.3* 9.9   Liver Function Tests:  Recent Labs  03/29/15 0812 06/28/15 0804 10/19/15 0810  AST 17 19 25   ALT 12 11 23   ALKPHOS 84 76 81  BILITOT 0.2 0.2 0.3  PROT 6.7 6.9 7.1  ALBUMIN 3.8 3.9 4.0   No results for input(s): LIPASE, AMYLASE in the last 8760 hours. No results for input(s): AMMONIA in the last 8760 hours. CBC:  Recent Labs  08/27/15 0905 01/01/16 0014  WBC 9.6 10.3  NEUTROABS 3.4  --   HGB  --  12.2*  HCT 40.0 36.4*  MCV 80 77.9*  PLT 224 212   Lipid Panel:  Recent Labs  08/27/15 0905   CHOL 168  HDL 39*  LDLCALC 103*  TRIG 130  CHOLHDL 4.3   Lab Results  Component Value Date   HGBA1C 6.7* 10/19/2015    Procedures since last visit: No results found.  Assessment/Plan 1. Headache, chronic daily - unremitting headache for over a month - will try a triptan for him - advised that if he still has a headache tomorrow, he should call back and I will refer him to headache clinic at neurology - ZOLMitriptan (ZOMIG) 2.5 MG tablet; Take 1 tablet (2.5 mg total) by mouth once. May repeat in 2 hours if headache persists or recurs.  Dispense: 9 tablet; Refill: 3  2. Diabetes mellitus due to underlying condition, controlled, with stage 2 chronic kidney disease, with long-term current use of insulin (HCC) - cont current regimen - has labs in May for this and follows with Cathey - Microalbumin/Creatinine Ratio, Urine  3. Essential hypertension -bp at goal with current medications so cont same w/o change and monitor  4. Obesity, Class III, BMI 40-49.9 (morbid obesity) ([place]) -not improving considering he is not exercising now due to severe headache  5. Generalized osteoarthritis of multiple sites -ongoing and not helped by current decreased mobility -try to avoid nsaids due to DMII and renal function  6. Hyperlipidemia LDL goal <70 -cont current regimen for this, needs to lose weight and workout but he's not right now due to month-long headache  Labs/tests ordered:  No orders of the defined types were placed in this encounter.  keep labs with Cathey and f/u appt   Next appt:  F/u 3 mos  Franceen Erisman L. Zarif Rathje, D.O. [facility] [address], [place] [address] Cell Phone (Mon-Fri 8am-5pm):  [phone] On Call:  [phone] & follow prompts after 5pm & weekends Office Phone:  [phone] Office Fax:  [phone]

## 2016-02-01 ENCOUNTER — Telehealth: Payer: Self-pay

## 2016-02-01 DIAGNOSIS — G43711 Chronic migraine without aura, intractable, with status migrainosus: Secondary | ICD-10-CM

## 2016-02-01 LAB — MICROALBUMIN / CREATININE URINE RATIO
Creatinine, Urine: 82.6 mg/dL
MICROALB/CREAT RATIO: <3.6 mg/g creat (ref 0.0–30.0)
Microalbumin, Urine: <3 ug/mL

## 2016-02-01 NOTE — Telephone Encounter (Signed)
Patient called to inform Dr.Reed Zomig is not helping him and he is unable to keep anything down. Patient was told to call with status update. As indicated in last OV note patient would be referred to Headache Clinic. Order pending (please complete) and advise

## 2016-02-04 NOTE — Telephone Encounter (Signed)
Left detailed message on machine that referral was placed and he should be looking for a call, also advised to call back if any questions

## 2016-02-04 NOTE — Telephone Encounter (Signed)
Message from [name], DO sent at 02/01/2016 5:27 PM EDT -----    Order to neurology was placed.

## 2016-02-11 ENCOUNTER — Encounter: Payer: Self-pay | Admitting: Neurology

## 2016-02-11 ENCOUNTER — Ambulatory Visit (INDEPENDENT_AMBULATORY_CARE_PROVIDER_SITE_OTHER): Payer: Medicare Other | Admitting: Neurology

## 2016-02-11 VITALS — BP 130/74 | HR 75 | Ht 71.0 in | Wt 297.4 lb

## 2016-02-11 DIAGNOSIS — M5481 Occipital neuralgia: Secondary | ICD-10-CM

## 2016-02-11 DIAGNOSIS — J323 Chronic sphenoidal sinusitis: Secondary | ICD-10-CM | POA: Diagnosis not present

## 2016-02-11 DIAGNOSIS — R51 Headache: Secondary | ICD-10-CM | POA: Diagnosis not present

## 2016-02-11 DIAGNOSIS — R519 Headache, unspecified: Secondary | ICD-10-CM

## 2016-02-11 NOTE — Progress Notes (Signed)
[facility]    Provider:  Dr Jaynee Eagles Referring Provider: [name], DO Primary Care Physician:  Hollace Kinnier, DO  CC:  headaches  HPI:  Adrian Matthews is a 70 y.o. male here as a referral from Dr. Mariea Clonts for headaches.PMHx HTN, HLD, morbid obesity, CKD, New daily persistent headache, monoclonal gammopathy, out, fatigue.  Just started 2 months agi. Never had headaches before. Adrian headaches are continuous. No inciting events, no head trauma, new medications. He first noticed Adrian headaches in early march and he thought they were sinus related, he is having a lot of sinus problems, He just noticed it one day nothing happened or any special inciting events. They got worse and he sent to Adrian ED at Adrian end of March, he was in tears. Adrian end of April headaches got better. He has pain in Adrian back on Adrian left side of Adrian head. It is throbbing (points to Adrian left occipital area), hurts to lay on, not sensitivie to touch. If he lays his head back in Adrian wrong way it hurts but denies any neck pain. It is throbbing right now. Very localized to Adrian back part of Adrian head. No vision changes, no jaw pain, no fevers or systemic signs, no neck pain. He does not know if her snores but he wakes up with dry mouth, he has 5 bottles of water on Adrian side of his bed, he is not tired during Adrian day, he goes to gym at 5am and he takes a nap and then go to bed at 9pm but he wakes up every morning with headaches. He wakes up coughing at night. Mostly in Adrian morning but his mouth can get dry otherwise. He wakes with Adrian headaches every morning. He is morbidly obese. No photophobia, nausea.   Reviewed notes, labs and imaging from outside physicians, which showed:  CT of Adrian head 01/01/2016 (personally reviewed images, also reviewed with Matthews, and agree with Adrian following): FINDINGS: Adrian ventricles are mildly prominent in a generalized manner, a stable finding. Adrian sulci appear within normal limits. There  is no intracranial mass, hemorrhage, extra-axial fluid collection, or midline shift. There is rather minimal small vessel disease in Adrian centra semiovale bilaterally. Elsewhere, gray-white compartments appear normal. No acute infarct evident. Adrian bony calvarium appears intact. Adrian mastoid air cells are clear. There is opacification of much of Adrian right sphenoid sinus region. No intraorbital lesions are evident.  IMPRESSION: Stable mild generalized ventricular prominence. Adrian sulci appear normal. Adrian significance of this mild ventricular prominence is uncertain. Question any clinical findings suggesting potential normal pressure hydrocephalus.  There is minimal periventricular small vessel disease. No acute infarct evident. No hemorrhage or mass effect.  There is right sphenoid sinus opacification.  BMP with creatinins 1.19, hgba1c 6.7  Review of Systems: Matthews complains of symptoms per HPI as well as Adrian following symptoms: cough, no CP, no abdominal pain, no fever or chills, weight stable. Pertinent negatives per HPI. All others negative.   Social History   Social History  . Marital Status: Single    Spouse Name: N/A  . Number of Children: 0  . Years of Education: 16   Occupational History  . Retired     Social History Main Topics  . Smoking status: Never Smoker   . Smokeless tobacco: Never Used  . Alcohol Use: No  . Drug Use: No  . Sexual Activity: Not on file   Other Topics Concern  . Not on file  Social History Narrative   Lives alone.   Caffeine use: Coffee (1 cup/morning)   Soda (rare)    Family History  Problem Relation Age of Onset  . Hypertension Sister   . Hypertension Brother   . Diabetes Brother   . Hypertension Brother   . Hypertension Sister   . Migraines Neg Hx     Past Medical History  Diagnosis Date  . DM (diabetes mellitus) ([place])   . Hypertension   . Hyperlipidemia   . Osteoarthrosis, unspecified whether generalized or  localized, lower leg   . Primary localized osteoarthrosis, unspecified site   . Asymptomatic varicose veins   . New daily persistent headache   . Unspecified hereditary and idiopathic peripheral neuropathy   . Chronic kidney disease   . Monoclonal paraproteinemia   . Gout, unspecified   . Hypercalcemia   . Hyperosmolality and/or hypernatremia   . Diaphragmatic hernia without mention of obstruction or gangrene   . Unspecified disorder of kidney and ureter   . Other malaise and fatigue   . GERD (gastroesophageal reflux disease)     Past Surgical History  Procedure Laterality Date  . Carpal tunnel release  2007  . Colonoscopy N/A 06/06/2013    Procedure: COLONOSCOPY;  Surgeon: Cleotis Nipper, MD;  Location: [facility] ENDOSCOPY;  Service: Endoscopy;  Laterality: N/A;    Current Outpatient Prescriptions  Medication Sig Dispense Refill  . amLODipine (NORVASC) 10 MG tablet TAKE 1 TABLET DAILY FOR BLOOD PRESSURE 90 tablet 0  . atorvastatin (LIPITOR) 40 MG tablet TAKE 1 TABLET DAILY 90 tablet 1  . chlorthalidone (HYGROTON) 25 MG tablet TAKE 1 TABLET DAILY FOR BLOOD PRESSURE 90 tablet 1  . FREESTYLE LITE test strip CHECK BLOOD SUGAR THREE TIMES A DAY 300 each 1  . Insulin Glargine (LANTUS SOLOSTAR) 100 UNIT/ML Solostar Pen Inject 60 Units into Adrian skin daily at 10 pm. 20 pen 6  . insulin lispro (HUMALOG KWIKPEN) 100 UNIT/ML KiwkPen Inject 22 units before breakfast, 26 units before lunch & supper - plus sliding scale. 30 pen 3  . Iron-FA-B Cmp-C-Biot-Probiotic (FUSION PLUS) CAPS Take 1 capsule by mouth every other day.    . Lancets (FREESTYLE) lancets Check blood sugar 3 times daily DX: 250.00 (Freestyle Lite Machine) 300 each 3  . losartan (COZAAR) 100 MG tablet TAKE 1 TABLET DAILY 90 tablet 3  . NEXIUM 40 MG capsule TAKE 1 CAPSULE DAILY 90 capsule 1  . potassium chloride SA (K-DUR,KLOR-CON) 20 MEQ tablet Take 1 tablet (20 mEq total) by mouth 2 (two) times daily. 180 tablet 3  . SURE COMFORT PEN  NEEDLES 32G X 4 MM MISC USE FOUR TIMES A DAY BEFORE MEALS 500 each 2  . ZOLMitriptan (ZOMIG) 2.5 MG tablet Take 1 tablet (2.5 mg total) by mouth once. May repeat in 2 hours if headache persists or recurs. 9 tablet 3   No current facility-administered medications for this visit.    Allergies as of 02/11/2016  . (No Known Allergies)    Vitals: BP 130/74 mmHg  Pulse 75  Ht '5\' 11"'  (1.803 m)  Wt 297 lb 6.4 oz (134.9 kg)  BMI 41.50 kg/m2 Last Weight:  Wt Readings from Last 1 Encounters:  02/11/16 297 lb 6.4 oz (134.9 kg)   Last Height:   Ht Readings from Last 1 Encounters:  02/11/16 '5\' 11"'  (1.803 m)   Physical exam: Exam: Gen: NAD, conversant, well nourised, mobidly obese, well groomed  CV: RRR, no MRG. No Carotid Bruits. No peripheral edema, warm, nontender Eyes: Conjunctivae clear without exudates or hemorrhage  Neuro: Detailed Neurologic Exam  Speech:    Speech is normal; fluent and spontaneous with normal comprehension.  Cognition:    Adrian Matthews is oriented to person, place, and time;     recent and remote memory intact;     language fluent;     normal attention, concentration,     fund of knowledge Cranial Nerves:    Adrian pupils are equal, round, and reactive to light. Adrian fundi are without edema. Visual fields are full to finger confrontation. Extraocular movements are intact. Trigeminal sensation is intact and Adrian muscles of mastication are normal. Adrian face is symmetric. Adrian palate elevates in Adrian midline. Hearing intact. Voice is normal. Shoulder shrug is normal. Adrian tongue has normal motion without fasciculations.   Coordination:    Normal finger to nose and heel to shin. Normal rapid alternating movements.   Gait:    Heel-toe gait are normal.  Wide based due to very large body habitus.   Motor Observation:    No asymmetry, no atrophy, and no involuntary movements noted. Tone:    Normal muscle tone.    Posture:    Posture is normal. normal  erect    Strength:    Strength is V/V in Adrian upper and lower limbs.      Sensation: intact to LT     Reflex Exam:  DTR's:    Deep tendon reflexes in Adrian upper extremities are normal bilaterally, hypo in Adrian patellars and absent in Adrian achilles   Toes:    Adrian toes are downgoing bilaterally.   Clonus:    Clonus is absent.    Assessment/Plan:   70 y.o. male here as a referral from Dr. Mariea Clonts for headaches.PMHx HTN, HLD, morbid obesity, CKD, New daily persistent headache, monoclonal gammopathy, out, fatigue. New onset headaches localized to Adrian left occipital area. Pain is localized to Adrian area of Adrian occipital nerve, need further imaging, will perform a nerve block today.  Occipital nerve blocks today in Adrian office, se eprocedure nerve below Mri brain w/wo contrast ENT evaluation for sinus congestion and sphenoid density on CT of Adrian head Sleep evaluation: morning dry mouth and headaches. Evaluate for possible OSA or hypoventilation obesity syndrome. F/u in 8 weeks, if headaches return can come back sooner for another block or to discuss new medication ESR and CRP to eval for temporal arteritis however does not fit this syndrome  To prevent or relieve headaches, try Adrian following: Cool Compress. Lie down and place a cool compress on your head.  Avoid headache triggers. If certain foods or odors seem to have triggered your migraines in Adrian past, avoid them. A headache diary might help you identify triggers.  Include physical activity in your daily routine. Try a daily walk or other moderate aerobic exercise.  Manage stress. Find healthy ways to cope with Adrian stressors, such as delegating tasks on your to-do list.  Practice relaxation techniques. Try deep breathing, yoga, massage and visualization.  Eat regularly. Eating regularly scheduled meals and maintaining a healthy diet might help prevent headaches. Also, drink plenty of fluids.  Follow a regular sleep schedule. Sleep deprivation  might contribute to headaches Consider biofeedback. With this mind-body technique, you learn to control certain bodily functions - such as muscle tension, heart rate and blood pressure - to prevent headaches or reduce headache pain.    Proceed to emergency room  if you experience new or worsening symptoms or symptoms do not resolve, if you have new neurologic symptoms or if headache is severe, or for any concerning symptom.    NERVE BLOCK PROCEDURE NOTE  Procedure: Matthews was consented for left-sided occipital  nerve blocks. A solution Adrian below ingredients  was prepared in a 3-CC syringes with 30 gauge 1/2 inch needle.   3 Target areas in Adrian occipital and  suboccipital regions were identified via palpation and pain response.Adrian sites junctions were sterilized with alcohol wipes. Adrian sites were sterilized with alcohol wipes. 67m was injected at each site. Adrian contents of each syringe was injected in a fanlike fashion. Adrian headache improved from 5/10 to 4010. Matthews tolerated Adrian procedure well and no complications were noted.    Consent was provided below and Matthews acknowledged understanding:   Nerve Block: Lidocaine 2% 159mLot: [id number] 08/2019 NDC: 6318984-210-31Depo-medrol 8075mL 1ml28mt: [id number]: 09/2016 NDC: [id number] 0.5% 1 ml Lot: [id number] Expiration: 05/13/2017 NDC: [id number] to expect afterwards?  Immediately after Adrian injection, Adrian back of your head may feel warm and numb. You may also experience reduction in Adrian pain. Adrian local anaesthetic wears off in a few hours and Adrian steroid usually takes  3-7 days to take effect.   Adrian pain relief is vary variable and can last from a few days to several months. Some patients do not experience any pain relief. Hence it is difficult to predict Adrian outcome of Adrian injection treatment in a particular Matthews.   There may be some discomfort at Adrian injection site for a couple of days  after treatment, however, this should settle quite quickly. We advise you to take things easy for Adrian rest of Adrian day. Continue taking your pain medication as advised by your consultant or until you feel benefit from Adrian treatment.   What are Adrian side effects / complications?  Common   Soreness / bruising at Adrian injection site.   Temporary increase (up to 7 days) in pain following procedure.   Rare   Bleeding   Infection at Adrian injection site   Allergic reaction   New pain   Worsening pain   AntoSarina Ill  [facility] [address] [id number] [phone]

## 2016-02-11 NOTE — Progress Notes (Signed)
Nerve Block: Lidocaine 2% Lot: [id number] Expiration 08/2019 NDC: [id number]  Depo-medrol 80mg /mL Lot: [id number] Expiration: 09/2016 NDC: [id number]  Marcaine 0.5% Lot: [id number] Expiration: 05/13/2017 NDC: [id number]

## 2016-02-11 NOTE — Patient Instructions (Signed)
Remember to drink plenty of fluid, eat healthy meals and do not skip any meals. Try to eat protein with a every meal and eat a healthy snack such as fruit or nuts in between meals. Try to keep a regular sleep-wake schedule and try to exercise daily, particularly in the form of walking, 20-30 minutes a day, if you can.   As far as your medications are concerned, I would like to suggest: None at this time, you may return for nerve block if needed  As far as diagnostic testing: Labs, MRI of the brain, sleep eval, ENT eval  First available sleep eval then I would like to see you back in 8 weeks, sooner if we need to. Please call us with any interim questions, concerns, problems, updates or refill requests.   Please also call us for any test results so we can go over those with you on the phone.  My clinical assistant and will answer any of your questions and relay your messages to me and also relay most of my messages to you.   Our phone number is [phone]. We also have an after hours call service for urgent matters and there is a physician on-call for urgent questions. For any emergencies you know to call 911 or go to the nearest emergency room

## 2016-02-12 ENCOUNTER — Other Ambulatory Visit: Payer: Self-pay | Admitting: Internal Medicine

## 2016-02-12 DIAGNOSIS — J301 Allergic rhinitis due to pollen: Secondary | ICD-10-CM | POA: Diagnosis not present

## 2016-02-12 DIAGNOSIS — J323 Chronic sphenoidal sinusitis: Secondary | ICD-10-CM | POA: Diagnosis not present

## 2016-02-12 LAB — C-REACTIVE PROTEIN: CRP: 16.1 mg/L — AB (ref 0.0–4.9)

## 2016-02-12 LAB — SEDIMENTATION RATE: Sed Rate: 11 mm/hr (ref 0–30)

## 2016-02-13 ENCOUNTER — Telehealth: Payer: Self-pay | Admitting: *Deleted

## 2016-02-13 NOTE — Telephone Encounter (Signed)
LVM for pt to call about results. Gave [facility] phone number. Okay to inform pt labs unremarkable (nothing of concern) per Dr Jaynee Eagles.

## 2016-02-13 NOTE — Telephone Encounter (Signed)
-----   Message from Melvenia Beam, MD sent at 02/13/2016  7:30 AM EDT ----- Labs unremarkable thanks

## 2016-02-13 NOTE — Telephone Encounter (Signed)
Pt returned call. Told pt labs unremarkable. He expressed understand and did not have any questions.

## 2016-02-20 ENCOUNTER — Ambulatory Visit
Admission: RE | Admit: 2016-02-20 | Discharge: 2016-02-20 | Disposition: A | Discharge status: Home / Self Care | Payer: Medicare Other | Source: Ambulatory Visit | Attending: Neurology | Admitting: Neurology

## 2016-02-20 DIAGNOSIS — M5481 Occipital neuralgia: Secondary | ICD-10-CM

## 2016-02-20 DIAGNOSIS — R51 Headache: Secondary | ICD-10-CM | POA: Diagnosis not present

## 2016-02-20 DIAGNOSIS — R519 Headache, unspecified: Secondary | ICD-10-CM

## 2016-02-20 MED ORDER — GADOBENATE DIMEGLUMINE 529 MG/ML IV SOLN
20.0000 mL | Freq: Once | INTRAVENOUS | Status: AC | PRN
  Administered 2016-02-20: 20 mL via INTRAVENOUS

## 2016-02-21 DIAGNOSIS — J301 Allergic rhinitis due to pollen: Secondary | ICD-10-CM | POA: Diagnosis not present

## 2016-02-21 DIAGNOSIS — J323 Chronic sphenoidal sinusitis: Secondary | ICD-10-CM | POA: Diagnosis not present

## 2016-02-22 ENCOUNTER — Telehealth: Payer: Self-pay | Admitting: *Deleted

## 2016-02-22 NOTE — Telephone Encounter (Signed)
Sharyn Lull with [facility] called and stated that patient was there and they needed his immunization record faxed to them at Fax#: [phone] Faxed

## 2016-02-25 ENCOUNTER — Telehealth: Payer: Self-pay | Admitting: Neurology

## 2016-02-25 NOTE — Telephone Encounter (Signed)
I spoke to patient. i will order MRI and labs tonight. I'll le tyou know when I am done so you can call him and let him know thanks!

## 2016-02-25 NOTE — Telephone Encounter (Signed)
Tried calling patient. 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 This is a gland that produces hormones. Sometimes we see benign growths in the gland but we need another MRI of the brain to specifically look at the pituitary gland and see why it is enlarged.I also need to order a few endocrine labs like thyroid studies etc.  Let me know if he agrees, thanks.  IMPRESSION: This MRI of the brain with and without contrast shows the following: 1.   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 Dedicated pituitary imaging with MRI and endocrine labs are recommended to better assess for the possibility of a microadenoma. 2.  There is mild age appropriate cortical atrophy and minimal age-appropriate chronic microvascular ischemic change. 3.  There are no acute findings.

## 2016-02-26 ENCOUNTER — Other Ambulatory Visit (INDEPENDENT_AMBULATORY_CARE_PROVIDER_SITE_OTHER): Payer: Self-pay

## 2016-02-26 ENCOUNTER — Other Ambulatory Visit: Payer: Self-pay | Admitting: Neurology

## 2016-02-26 DIAGNOSIS — R947 Abnormal results of other endocrine function studies: Secondary | ICD-10-CM

## 2016-02-26 DIAGNOSIS — E237 Disorder of pituitary gland, unspecified: Secondary | ICD-10-CM

## 2016-02-26 DIAGNOSIS — Z0289 Encounter for other administrative examinations: Secondary | ICD-10-CM

## 2016-02-26 NOTE — Telephone Encounter (Signed)
Called and spoke to pt. Explained how he can get labs done in our office. He does not need appt. Hours: Mon-Thurs 8-5pm and Fri 8-12pm. He states he will come tomorrow. I advised him to try and avoid between 1130-1pm because our lab tech takes lunch between that time. He verbalized understanding. He states he wanted to know if MRI will be covered since he just had one. He is scheduled next week for MRI. I advised him to call [facility]. They should have this information. He states he has their number and will call. Advised him to call back if he has further questions/concerns.

## 2016-02-26 NOTE — Telephone Encounter (Signed)
Dr Ahern- FYI 

## 2016-02-26 NOTE — Telephone Encounter (Signed)
Adrian Matthews, imaging ordered and labs placed. Please call patient and let him know. Also explain how to come in and get labs thanks

## 2016-02-27 LAB — COMPREHENSIVE METABOLIC PANEL
ALBUMIN: 4.4 g/dL (ref 3.6–4.8)
ALT: 15 IU/L (ref 0–44)
AST: 17 IU/L (ref 0–40)
Albumin/Globulin Ratio: 1.5 (ref 1.2–2.2)
Alkaline Phosphatase: 101 IU/L (ref 39–117)
BUN / CREAT RATIO: 17 (ref 10–24)
BUN: 26 mg/dL (ref 8–27)
Bilirubin Total: <0.2 mg/dL (ref 0.0–1.2)
CALCIUM: 9.7 mg/dL (ref 8.6–10.2)
CO2: 25 mmol/L (ref 18–29)
CREATININE: 1.56 mg/dL — AB (ref 0.76–1.27)
Chloride: 95 mmol/L — ABNORMAL LOW (ref 96–106)
GFR calc Af Amer: 52 mL/min/{1.73_m2} — ABNORMAL LOW (ref >59)
GFR, EST NON AFRICAN AMERICAN: 45 mL/min/{1.73_m2} — AB (ref >59)
GLOBULIN, TOTAL: 3 g/dL (ref 1.5–4.5)
Glucose: 113 mg/dL — ABNORMAL HIGH (ref 65–99)
Potassium: 3.7 mmol/L (ref 3.5–5.2)
SODIUM: 138 mmol/L (ref 134–144)
TOTAL PROTEIN: 7.4 g/dL (ref 6.0–8.5)

## 2016-02-27 LAB — FOLLICLE STIMULATING HORMONE: FSH: 3.6 m[IU]/mL (ref 1.5–12.4)

## 2016-02-27 LAB — CBC
Hematocrit: 38.5 % (ref 37.5–51.0)
Hemoglobin: 12.7 g/dL (ref 12.6–17.7)
MCH: 26.8 pg (ref 26.6–33.0)
MCHC: 33 g/dL (ref 31.5–35.7)
MCV: 81 fL (ref 79–97)
PLATELETS: 261 10*3/uL (ref 150–379)
RBC: 4.73 x10E6/uL (ref 4.14–5.80)
RDW: 15.5 % — AB (ref 12.3–15.4)
WBC: 12.6 10*3/uL — ABNORMAL HIGH (ref 3.4–10.8)

## 2016-02-27 LAB — LUTEINIZING HORMONE: LH: 2.3 m[IU]/mL (ref 1.7–8.6)

## 2016-02-27 LAB — THYROID PANEL WITH TSH
Free Thyroxine Index: 1.8 (ref 1.2–4.9)
T3 Uptake Ratio: 26 % (ref 24–39)
T4 TOTAL: 7 ug/dL (ref 4.5–12.0)
TSH: 2.52 u[IU]/mL (ref 0.450–4.500)

## 2016-02-27 LAB — INSULIN-LIKE GROWTH FACTOR: Insulin-Like GF-1: 88 ng/mL (ref 47–192)

## 2016-02-27 LAB — PROLACTIN: Prolactin: 29.1 ng/mL — ABNORMAL HIGH (ref 4.0–15.2)

## 2016-02-27 LAB — HM MAMMOGRAPHY

## 2016-02-29 ENCOUNTER — Other Ambulatory Visit: Payer: Self-pay | Admitting: Neurology

## 2016-02-29 ENCOUNTER — Encounter: Payer: Medicare Other | Admitting: Internal Medicine

## 2016-02-29 ENCOUNTER — Telehealth: Payer: Self-pay

## 2016-02-29 DIAGNOSIS — D352 Benign neoplasm of pituitary gland: Secondary | ICD-10-CM

## 2016-02-29 DIAGNOSIS — E221 Hyperprolactinemia: Secondary | ICD-10-CM

## 2016-02-29 NOTE — Telephone Encounter (Signed)
-----   Message from Melvenia Beam, MD sent at 02/29/2016  7:57 AM EDT ----- Patient's prolactin is elevated, this is a hormone made in response to the pituitary gland. We saw enlargement of this gland in his brain so appears it may be making prolactin. He need to go to endocrinology. I will call him with MRI results of his next brain scan thanks

## 2016-02-29 NOTE — Telephone Encounter (Signed)
I spoke to patient and relayed message below. He voiced understanding and is agreeable to referral.

## 2016-03-03 ENCOUNTER — Ambulatory Visit
Admission: RE | Admit: 2016-03-03 | Discharge: 2016-03-03 | Disposition: A | Discharge status: Home / Self Care | Payer: Medicare Other | Source: Ambulatory Visit | Attending: Otolaryngology | Admitting: Otolaryngology

## 2016-03-03 ENCOUNTER — Other Ambulatory Visit: Payer: Self-pay | Admitting: Otolaryngology

## 2016-03-03 DIAGNOSIS — J323 Chronic sphenoidal sinusitis: Secondary | ICD-10-CM | POA: Diagnosis not present

## 2016-03-03 DIAGNOSIS — J329 Chronic sinusitis, unspecified: Secondary | ICD-10-CM

## 2016-03-06 ENCOUNTER — Encounter: Payer: Self-pay | Admitting: Internal Medicine

## 2016-03-06 ENCOUNTER — Ambulatory Visit (INDEPENDENT_AMBULATORY_CARE_PROVIDER_SITE_OTHER): Payer: Medicare Other | Admitting: Internal Medicine

## 2016-03-06 VITALS — BP 130/70 | HR 67 | Temp 98.3°F | Ht 71.0 in | Wt 294.0 lb

## 2016-03-06 DIAGNOSIS — D352 Benign neoplasm of pituitary gland: Secondary | ICD-10-CM | POA: Diagnosis not present

## 2016-03-06 DIAGNOSIS — Z794 Long term (current) use of insulin: Secondary | ICD-10-CM | POA: Diagnosis not present

## 2016-03-06 DIAGNOSIS — J329 Chronic sinusitis, unspecified: Secondary | ICD-10-CM

## 2016-03-06 DIAGNOSIS — K219 Gastro-esophageal reflux disease without esophagitis: Secondary | ICD-10-CM

## 2016-03-06 DIAGNOSIS — I10 Essential (primary) hypertension: Secondary | ICD-10-CM | POA: Diagnosis not present

## 2016-03-06 DIAGNOSIS — R519 Headache, unspecified: Secondary | ICD-10-CM

## 2016-03-06 DIAGNOSIS — Z0000 Encounter for general adult medical examination without abnormal findings: Secondary | ICD-10-CM | POA: Diagnosis not present

## 2016-03-06 DIAGNOSIS — E229 Hyperfunction of pituitary gland, unspecified: Secondary | ICD-10-CM

## 2016-03-06 DIAGNOSIS — R51 Headache: Secondary | ICD-10-CM

## 2016-03-06 DIAGNOSIS — N182 Chronic kidney disease, stage 2 (mild): Secondary | ICD-10-CM | POA: Diagnosis not present

## 2016-03-06 DIAGNOSIS — E0822 Diabetes mellitus due to underlying condition with diabetic chronic kidney disease: Secondary | ICD-10-CM

## 2016-03-06 DIAGNOSIS — E66813 Obesity, class 3: Secondary | ICD-10-CM

## 2016-03-06 MED ORDER — PANTOPRAZOLE SODIUM 40 MG PO TBEC: 40.0000 mg | DELAYED_RELEASE_TABLET | Freq: Every day | ORAL | Status: DC

## 2016-03-06 NOTE — Progress Notes (Signed)
Location:  [facility] clinic Provider: Katriel Cutsforth L. Mariea Clonts, D.O., C.M.D.  Patient Care Team: [name], DO as PCP - General (Geriatric Medicine) Elmarie Shiley, MD as Consulting Physician (Nephrology) Sharyne Peach, MD as Consulting Physician (Ophthalmology) Ronald Lobo, MD as Consulting Physician (Gastroenterology)  Extended Emergency Contact Information Primary Emergency Contact: [facility] Address: [address] [address] [place] Phone: [phone] Relation: Brother  Code Status: full code Goals of Care: Advanced Directive information Advanced Directives 03/06/2016  Does patient have an advance directive? No  Type of Advance Directive -  Does patient want to make changes to advanced directive? -  Copy of advanced directive(s) in chart? -  says he needs to go to the safety deposit box and get his living will  Chief Complaint  Patient presents with  . Annual Exam    wellness exam  . MMSE    27/30 passed    HPI: Patient is a 70 y.o. male seen in today for an annual wellness exam.    Depression screen [facility] 2/9 03/06/2016 01/31/2016 04/02/2015 08/17/2014 04/20/2014  Decreased Interest 0 0 0 0 0  Down, Depressed, Hopeless 0 0 0 0 0  PHQ - 2 Score 0 0 0 0 0    Fall Risk  03/06/2016 01/31/2016 07/02/2015 04/26/2015 04/02/2015  Falls in the past year? No No No No No   MMSE - Mini Mental State Exam 03/06/2016  Orientation to time 5  Orientation to Place 5  Registration 3  Attention/ Calculation 4  Recall 2  Language- name 2 objects 2  Language- repeat 1  Language- follow 3 step command 3  Language- read & follow direction 1  Write a sentence 1  Copy design 0  Total score 27  Passed clock  Health Maintenance  Topic Date Due  . Hepatitis C Screening  24-Apr-1946  . OPHTHALMOLOGY EXAM  06/08/2015  . HEMOGLOBIN A1C  04/17/2016  . INFLUENZA VACCINE  05/13/2016  . FOOT EXAM  03/06/2017  . TETANUS/TDAP  04/13/2023  . COLONOSCOPY   06/07/2023  . ZOSTAVAX  Completed  . PNA vac Low Risk Adult  Completed   Dr. Ernesto Rutherford put him on abx on Monday and headache feeling worse after that.  Had a sinus infection and still working on getting that better.  After first abx no dry mouth like he used to.  Dr. Jaynee Eagles did CT and MRI and had injections (trigger points) and has another MRI tomorrow.  MRI had shown pituitary enlargement and possible microadenoma.    Had his physical on 5/12.  Had a mammogram and [place] there.  Has that letter showing things were ok.  Had knee xrays.    Urinary incontinence?  none Functional Status Survey: Is the patient deaf or have difficulty hearing?: No Does the patient have difficulty seeing, even when wearing glasses/contacts?: No Does the patient have difficulty concentrating, remembering, or making decisions?: No Does the patient have difficulty walking or climbing stairs?: No Does the patient have difficulty dressing or bathing?: No Does the patient have difficulty doing errands alone such as visiting a doctor's office or shopping?: No Exercise?  Has not been back to the gym.  Can't go b/c abx made him nauseated.  Current Exercise Habits: The patient does not participate in regular exercise at present Exercise limited by: neurologic condition(s);orthopedic condition(s) Diet?  Trying to follow healthy diet. Vision Screening Comments: Dr. Delman Cheadle  last year--need notes Hearing:  No problem Dentition:  No difficulty, goes to dentist in 2 mos for year Pain:  Headache--ok if does not move his head; starts sweating and pounding if moves his head.  Has elevated prolactin and enlarged pituitary.    Past Medical History  Diagnosis Date  . DM (diabetes mellitus) ([place])   . Hypertension   . Hyperlipidemia   . Osteoarthrosis, unspecified whether generalized or localized, lower leg   . Primary localized osteoarthrosis, unspecified site   . Asymptomatic varicose veins   . New daily persistent headache   .  Unspecified hereditary and idiopathic peripheral neuropathy   . Chronic kidney disease   . Monoclonal paraproteinemia   . Gout, unspecified   . Hypercalcemia   . Hyperosmolality and/or hypernatremia   . Diaphragmatic hernia without mention of obstruction or gangrene   . Unspecified disorder of kidney and ureter   . Other malaise and fatigue   . GERD (gastroesophageal reflux disease)     Past Surgical History  Procedure Laterality Date  . Carpal tunnel release  2007  . Colonoscopy N/A 06/06/2013    Procedure: COLONOSCOPY;  Surgeon: Cleotis Nipper, MD;  Location: [facility] ENDOSCOPY;  Service: Endoscopy;  Laterality: N/A;    Social History   Social History  . Marital Status: Single    Spouse Name: N/A  . Number of Children: 0  . Years of Education: 16   Occupational History  . Retired     Social History Main Topics  . Smoking status: Never Smoker   . Smokeless tobacco: Never Used  . Alcohol Use: No  . Drug Use: No  . Sexual Activity: Not on file   Other Topics Concern  . Not on file   Social History Narrative   Lives alone.   Caffeine use: Coffee (1 cup/morning)   Soda (rare)    No Known Allergies    Medication List       This list is accurate as of: 03/06/16 10:12 AM.  Always use your most recent med list.               amLODipine 10 MG tablet  Commonly known as:  NORVASC  TAKE 1 TABLET DAILY FOR BLOOD PRESSURE     atorvastatin 40 MG tablet  Commonly known as:  LIPITOR  TAKE 1 TABLET DAILY     chlorthalidone 25 MG tablet  Commonly known as:  HYGROTON  TAKE 1 TABLET DAILY FOR BLOOD PRESSURE     FREESTYLE LITE test strip  Generic drug:  glucose blood  CHECK BLOOD SUGAR THREE TIMES A DAY     FUSION PLUS Caps  Take 1 capsule by mouth every other day.     Insulin Glargine 100 UNIT/ML Solostar Pen  Commonly known as:  LANTUS SOLOSTAR  Inject 60 Units into the skin daily at 10 pm.     insulin lispro 100 UNIT/ML KiwkPen  Commonly known as:  HUMALOG  KWIKPEN  Inject 22 units before breakfast, 26 units before lunch & supper - plus sliding scale.     losartan 100 MG tablet  Commonly known as:  COZAAR  TAKE 1 TABLET DAILY     pantoprazole 40 MG tablet  Commonly known as:  PROTONIX  Take 1 tablet (40 mg total) by mouth daily.     potassium chloride SA 20 MEQ tablet  Commonly known as:  K-DUR,KLOR-CON  Take 1 tablet (20 mEq total) by mouth 2 (two) times daily.     SURE COMFORT PEN NEEDLES  32G X 4 MM Misc  Generic drug:  Insulin Pen Needle  USE FOUR TIMES A DAY BEFORE MEALS     ZOLMitriptan 2.5 MG tablet  Commonly known as:  ZOMIG  Take 1 tablet (2.5 mg total) by mouth once. May repeat in 2 hours if headache persists or recurs.         Review of Systems:  Review of Systems  Constitutional: Positive for malaise/fatigue. Negative for fever, chills and weight loss.  HENT: Positive for congestion. Negative for hearing loss and sore throat.   Eyes: Negative for blurred vision.  Respiratory: Positive for cough and sputum production. Negative for shortness of breath.        Improved with abx for sinuses  Cardiovascular: Negative for chest pain, palpitations and leg swelling.  Gastrointestinal: Positive for heartburn. Negative for abdominal pain, constipation, blood in stool and melena.  Genitourinary: Negative for dysuria, urgency and frequency.  Musculoskeletal: Negative for falls.       Knee pain not bothersome lately  Skin: Negative for rash.  Neurological: Positive for headaches. Negative for dizziness and loss of consciousness.  Psychiatric/Behavioral: Negative for depression and memory loss. The patient is nervous/anxious. The patient does not have insomnia.     Physical Exam: Filed Vitals:   03/06/16 0900  BP: 130/70  Pulse: 67  Temp: 98.3 F (36.8 C)  TempSrc: Oral  Height: 5\' 11"  (1.803 m)  Weight: 294 lb (133.358 kg)  SpO2: 97%   Body mass index is 41.02 kg/(m^2). Physical Exam  Constitutional: He is  oriented to person, place, and time. He appears well-developed and well-nourished. No distress.  Obese male in nad  HENT:  Head: Normocephalic and atraumatic.  Right Ear: External ear normal.  Left Ear: External ear normal.  Nose: Nose normal.  Mouth/Throat: Oropharynx is clear and moist. No oropharyngeal exudate.  Eyes: Conjunctivae and EOM are normal. Pupils are equal, round, and reactive to light.  Neck: Normal range of motion. Neck supple. No JVD present.  Cardiovascular: Normal rate, regular rhythm, normal heart sounds and intact distal pulses.   No murmur heard. Pulmonary/Chest: Effort normal and breath sounds normal. No respiratory distress. He has no wheezes.  Abdominal: Soft. Bowel sounds are normal. He exhibits no distension and no mass. There is no tenderness.  Musculoskeletal: Normal range of motion. He exhibits no edema or tenderness.  Lymphadenopathy:    He has no cervical adenopathy.  Neurological: He is alert and oriented to person, place, and time. He has normal reflexes. No cranial nerve deficit.  Skin: Skin is warm and dry.  Psychiatric: He has a normal mood and affect. His behavior is normal. Judgment and thought content normal.  Jokes around, sarcastic    Labs reviewed: Basic Metabolic Panel:  Recent Labs  10/19/15 0810 01/01/16 0014 02/26/16 1420  NA 138 130* 138  K 3.4* 3.0* 3.7  CL 94* 89* 95*  CO2 25 26 25   GLUCOSE 133* 166* 113*  BUN 15 12 26   CREATININE 1.13 1.19 1.56*  CALCIUM 10.3* 9.9 9.7  TSH  --   --  2.520   Liver Function Tests:  Recent Labs  06/28/15 0804 10/19/15 0810 02/26/16 1420  AST 19 25 17   ALT 11 23 15   ALKPHOS 76 81 101  BILITOT 0.2 0.3 <0.2  PROT 6.9 7.1 7.4  ALBUMIN 3.9 4.0 4.4   No results for input(s): LIPASE, AMYLASE in the last 8760 hours. No results for input(s): AMMONIA in the last 8760 hours. CBC:  Recent Labs  08/27/15 0905 01/01/16 0014 02/26/16 1420  WBC 9.6 10.3 12.6*  NEUTROABS 3.4  --   --     HGB  --  12.2*  --   HCT 40.0 36.4* 38.5  MCV 80 77.9* 81  PLT 224 212 261   Lipid Panel:  Recent Labs  08/27/15 0905  CHOL 168  HDL 39*  LDLCALC 103*  TRIG 130  CHOLHDL 4.3   Lab Results  Component Value Date   HGBA1C 6.7* 10/19/2015    Procedures: Dg Sinuses Complete  03/03/2016  CLINICAL DATA:  Three months of sinusitis EXAM: PARANASAL SINUSES - COMPLETE 3 + VIEW COMPARISON:  MRI of the brain of Feb 20, 2016 FINDINGS: The frontal and ethmoid and maxillary sinuses are clear. On the lateral view the sphenoid sinuses appear clear. No air-fluid levels are observed. IMPRESSION: No acute or chronic paranasal sinus inflammation is observed. Electronically Signed   By: David  Martinique M.D.   On: 03/03/2016 13:05   Mr Jeri Cos [id number] Contrast  02/21/2016   [facility] [address], [place], [place] [address] [phone] NEUROIMAGING REPORT STUDY DATE: 02/20/2016 PATIENT NAME: Torri Hirt DOB: 21-May-1946 MRN: [id number] EXAM: MRI Brain with and without contrast ORDERING CLINICIAN: Sarina Ill M.D. CLINICAL HISTORY: 70 year old man with headache and occipital neuralgia COMPARISON FILMS: None TECHNIQUE:MRI of the brain with and without contrast was obtained utilizing 5 mm axial slices with T1, T2, T2 flair, SWI and diffusion weighted views.  T1 sagittal, T2 coronal and postcontrast views in the axial and coronal plane were obtained. CONTRAST: 20 ml [employer] IMAGING SITE: [facility], [place]. FINDINGS: On sagittal images, the spinal cord is imaged caudally to C3 and is normal in caliber.   The contents of the posterior fossa are of normal size and position.   The pituitary gland is enlarged at 14.6 mm After contrast, a fairly homogenous pattern is identified. However, thin section dynamic imaging through the pituitary gland was not performed. The optic chiasm does not appear to be displaced.   There is mild cortical atrophy with compensatory mild  third and lateral ventricle enlargement.. There are no abnormal extra-axial collections of fluid.  The cerebellum and brainstem appears normal.   The deep gray matter appears normal.  The hemispheres there are a few scattered T2/FLAIR hyperintense foci in the subcortical and deep white matter consistent with minimal chronic microvascular ischemic change that is probably age-appropriate. None of these appear to be acute..  The orbits appear normal.   The VIIth/VIIIth nerve complex appears normal.  The mastoid air cells appear normal. The paranasal sinuses appear normal.  Flow voids are identified within the major intracerebral arteries.   Diffusion weighted images are normal.  Susceptibility weighted images are normal.   After the infusion of contrast material, a normal enhancement pattern is noted. IMPRESSION:  This MRI of the brain with and without contrast shows the following: 1.     The pituitary gland is enlarged.    It appears to enhance homogenously.  Dedicated pituitary imaging with MRI and endocrine labs are recommended to better assess for the possibility of a microadenoma. 2.    There is mild age appropriate cortical atrophy and minimal age-appropriate chronic microvascular ischemic change. 3.    There are no acute findings. INTERPRETING PHYSICIAN: Richard A. Sater, MD, PhD Certified in  Neuroimaging by [employer]   EKG today sinus rhythm with left anterior fascicular block, right bundle branch  block and RVH   Assessment/Plan 1. Medicare annual wellness visit, subsequent -is up to date except need the notes from Dr. Delman Cheadle for his eye visit--request made  2. Essential hypertension -bp controlled with current regimen - EKG 12-Lead done above  3. Gastroesophageal reflux disease without esophagitis -cont protonix daily and trying to avoid food triggers - pantoprazole (PROTONIX) 40 MG tablet; Take 1 tablet (40 mg total) by mouth daily.  Dispense: 90 tablet; Refill: 3  4.  Pituitary microadenoma with hyperprolactinemia ([place]) -new finding during workup of new chronic headaches -he is now going to see endocrinology and has a f/u dedicated MRI tomorrow  5. Chronic sinusitis, unspecified location -has been treated with abx for this, but seems the adenoma is probably the cause of his new headaches  6. Headache, chronic daily -see above--did get some benefit from zomig when he first took it, but headaches returned  7. Diabetes mellitus due to underlying condition, controlled, with stage 2 chronic kidney disease, with long-term current use of insulin ([place]) -has been well controlled -continues to work on diet, not exercising though lately with the headaches and stress of figuring out what's wrong with him -continues statin, lantus with humalog meal coverage  8. Obesity, Class III, BMI 40-49.9 (morbid obesity) ([place]) -again, he's trying to eat right, but not exercising due to other active problems--counseled and knows what to do, just not feeling up to it  Labs/tests ordered:   Orders Placed This Encounter  Procedures  . EKG 12-Lead    Next appt:  3 mos med mgt  Fenix Ruppe L. Sricharan Lacomb, D.O. [facility] [address], [place] [address] Cell Phone (Mon-Fri 8am-5pm):  [phone] On Call:  [phone] & follow prompts after 5pm & weekends Office Phone:  [phone] Office Fax:  [phone]

## 2016-03-07 ENCOUNTER — Telehealth: Payer: Self-pay | Admitting: *Deleted

## 2016-03-07 ENCOUNTER — Ambulatory Visit
Admission: RE | Admit: 2016-03-07 | Discharge: 2016-03-07 | Disposition: A | Discharge status: Home / Self Care | Payer: Medicare Other | Source: Ambulatory Visit | Attending: Neurology | Admitting: Neurology

## 2016-03-07 DIAGNOSIS — E237 Disorder of pituitary gland, unspecified: Secondary | ICD-10-CM

## 2016-03-07 NOTE — Telephone Encounter (Signed)
Dr Jaynee Eagles- see message. Thank you  Received call from [place] at [facility] imaging. Pt in their office now. Cannot obtain IV access. Multiple sticks per Vaughan Basta with multiple people. Pt not tolerating well per Vaughan Basta. I spoke to Dr Jaynee Eagles while on the phone with Vaughan Basta and Dr Jaynee Eagles stated it would be okay to let pt go home. Do not need test at this time. We will have him f/u with endocrinology per Dr Jaynee Eagles. She verbalized understanding and will relay the message to patient. Told her to have pt call us if he has any questions/concerns.

## 2016-03-11 DIAGNOSIS — J301 Allergic rhinitis due to pollen: Secondary | ICD-10-CM | POA: Diagnosis not present

## 2016-03-11 DIAGNOSIS — J323 Chronic sphenoidal sinusitis: Secondary | ICD-10-CM | POA: Diagnosis not present

## 2016-03-12 ENCOUNTER — Other Ambulatory Visit: Payer: Medicare Other

## 2016-03-12 DIAGNOSIS — N182 Chronic kidney disease, stage 2 (mild): Principal | ICD-10-CM

## 2016-03-12 DIAGNOSIS — Z794 Long term (current) use of insulin: Principal | ICD-10-CM

## 2016-03-12 DIAGNOSIS — E0822 Diabetes mellitus due to underlying condition with diabetic chronic kidney disease: Secondary | ICD-10-CM

## 2016-03-13 LAB — COMPREHENSIVE METABOLIC PANEL
ALT: 19 IU/L (ref 0–44)
AST: 21 IU/L (ref 0–40)
Albumin/Globulin Ratio: 1.2 (ref 1.2–2.2)
Albumin: 4 g/dL (ref 3.6–4.8)
Alkaline Phosphatase: 106 IU/L (ref 39–117)
BUN/Creatinine Ratio: 18 (ref 10–24)
BUN: 23 mg/dL (ref 8–27)
Bilirubin Total: <0.2 mg/dL (ref 0.0–1.2)
CO2: 27 mmol/L (ref 18–29)
Calcium: 9.8 mg/dL (ref 8.6–10.2)
Chloride: 94 mmol/L — ABNORMAL LOW (ref 96–106)
Creatinine, Ser: 1.29 mg/dL — ABNORMAL HIGH (ref 0.76–1.27)
GFR calc Af Amer: 65 mL/min/{1.73_m2} (ref >59)
GFR calc non Af Amer: 56 mL/min/{1.73_m2} — ABNORMAL LOW (ref >59)
Globulin, Total: 3.3 g/dL (ref 1.5–4.5)
Glucose: 67 mg/dL (ref 65–99)
Potassium: 3.8 mmol/L (ref 3.5–5.2)
Sodium: 137 mmol/L (ref 134–144)
Total Protein: 7.3 g/dL (ref 6.0–8.5)

## 2016-03-13 LAB — HEMOGLOBIN A1C
Est. average glucose Bld gHb Est-mCnc: 151 mg/dL
Hgb A1c MFr Bld: 6.9 % — ABNORMAL HIGH (ref 4.8–5.6)

## 2016-03-17 ENCOUNTER — Encounter: Payer: Self-pay | Admitting: Pharmacotherapy

## 2016-03-17 ENCOUNTER — Ambulatory Visit (INDEPENDENT_AMBULATORY_CARE_PROVIDER_SITE_OTHER): Payer: Medicare Other | Admitting: Pharmacotherapy

## 2016-03-17 VITALS — BP 138/76 | HR 68 | Temp 97.9°F | Resp 12 | Ht 71.0 in | Wt 298.0 lb

## 2016-03-17 DIAGNOSIS — N182 Chronic kidney disease, stage 2 (mild): Secondary | ICD-10-CM

## 2016-03-17 DIAGNOSIS — Z794 Long term (current) use of insulin: Secondary | ICD-10-CM | POA: Diagnosis not present

## 2016-03-17 DIAGNOSIS — I10 Essential (primary) hypertension: Secondary | ICD-10-CM

## 2016-03-17 DIAGNOSIS — E119 Type 2 diabetes mellitus without complications: Secondary | ICD-10-CM

## 2016-03-17 DIAGNOSIS — E0822 Diabetes mellitus due to underlying condition with diabetic chronic kidney disease: Secondary | ICD-10-CM | POA: Diagnosis not present

## 2016-03-17 MED ORDER — INSULIN LISPRO 100 UNIT/ML (KWIKPEN): PEN_INJECTOR | SUBCUTANEOUS | Status: DC

## 2016-03-17 MED ORDER — INSULIN GLARGINE 100 UNIT/ML SOLOSTAR PEN: 60.0000 [IU] | PEN_INJECTOR | Freq: Every day | SUBCUTANEOUS | Status: DC

## 2016-03-17 NOTE — Progress Notes (Signed)
  Subjective:    Adrian Matthews is a 70 y.o.African American male who presents for follow-up of Type 2 diabetes mellitus.   A1C 6.9% (was 6.7%) Forgot to bring BG meter.  "kinda low in the morning" Fasting BG: 65-91mg /dl Daytime BG:  "OK"  Trying to make healthy food choices.  More veggies and lean protein.  Healthy snacks. No routine exercise.  Limited due to migraines. Denies problems with vision. Denies problems with feet. Had a complete physical at [place]. Nocturia 4-5 times per night. Sleeping better since sinusitis treated. Denies dysuria. Staying well hydrated. No peripheral edema.   Review of Systems A comprehensive review of systems was negative except for: Constitutional: positive for migraine Genitourinary: positive for nocturia    Objective:    BP 138/76 mmHg  Pulse 68  Temp(Src) 97.9 F (36.6 C) (Oral)  Resp 12  Ht 5\' 11"  (1.803 m)  Wt 298 lb (135.172 kg)  BMI 41.58 kg/m2  SpO2 99%  General:  alert, cooperative and no distress  Oropharynx: normal findings: lips normal without lesions and gums healthy   Eyes:  negative findings: lids and lashes normal and conjunctivae and sclerae normal   Ears:  external ears normal        Lung: clear to auscultation bilaterally  Heart:  regular rate and rhythm     Extremities: extremities normal, atraumatic, no cyanosis or edema  Skin: dry     Neuro: mental status, speech normal, alert and oriented x3 and gait and station normal   Lab Review GLUCOSE (mg/dL)  Date Value  03/12/2016 67  02/26/2016 113*  10/19/2015 133*   GLUCOSE, BLD (mg/dL)  Date Value  01/01/2016 166*  06/05/2013 112*  06/04/2013 93   CO2 (mmol/L)  Date Value  03/12/2016 27  02/26/2016 25  01/01/2016 26   BUN (mg/dL)  Date Value  03/12/2016 23  02/26/2016 26  01/01/2016 12  10/19/2015 15  06/05/2013 14  06/04/2013 18   CREATININE (mg/dL)  Date Value  12/07/2009 1.87*  08/22/2009 1.85*   CREATININE, SER (mg/dL)  Date Value   03/12/2016 1.29*  02/26/2016 1.56*  01/01/2016 1.19       Assessment:    Diabetes Mellitus type II, under excellent control. A1C at goal <7% BP at goal < 140/90   Plan:    1.  Rx changes: none 2.  Continue Lantus 60 units daily. 3.  Continue Humalog 22 units breakfast, 26 units lunch & supper. 4.  Counseled on nutrition goals. 5.  Counseled on benefit of routine exercise.  Goal is 30-45 minutes 5 x week. 6.  BP at goal <140/90.

## 2016-03-17 NOTE — Addendum Note (Signed)
Addended by: [name] on: 03/17/2016 04:45 PM   Modules accepted: Miquel Dunn

## 2016-03-17 NOTE — Patient Instructions (Signed)
Keep up the good work

## 2016-03-23 ENCOUNTER — Other Ambulatory Visit: Payer: Self-pay | Admitting: Internal Medicine

## 2016-03-28 ENCOUNTER — Encounter: Payer: Self-pay | Admitting: Internal Medicine

## 2016-03-31 ENCOUNTER — Telehealth: Payer: Self-pay | Admitting: Neurology

## 2016-03-31 NOTE — Telephone Encounter (Signed)
[facility] is calling to let us know they received our referral and have set up an appt for this pt with Dr. Forde Dandy on July 13th 3:00.

## 2016-04-07 ENCOUNTER — Ambulatory Visit (INDEPENDENT_AMBULATORY_CARE_PROVIDER_SITE_OTHER): Payer: Medicare Other | Admitting: Neurology

## 2016-04-07 ENCOUNTER — Encounter: Payer: Self-pay | Admitting: Neurology

## 2016-04-07 VITALS — BP 122/71 | HR 73 | Ht 71.0 in | Wt 300.0 lb

## 2016-04-07 DIAGNOSIS — M5481 Occipital neuralgia: Secondary | ICD-10-CM

## 2016-04-07 DIAGNOSIS — D352 Benign neoplasm of pituitary gland: Secondary | ICD-10-CM

## 2016-04-07 NOTE — Progress Notes (Signed)
[facility]    Provider:  Dr Jaynee Eagles Referring Provider: [name], DO Primary Care Physician:  Hollace Kinnier, DO  CC: headaches  Interval history: Patient returns today for follow up of occipital neuralgia after nerve block 8 weeks ago. Labs were normal esr,crp and mri of the brain showed pituitary enlargement and he is scheduled for follow up with endocrinology. His appointment with endocrinology is in July. 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 His left head pain is better, more tender to the touch as opposed to painful like it was before. The nerve blocks helped, it eased off for quite a while, no more throbbing, no more headache just sensitive. Offered to repeat the blocks but he declined, he says he feels great. He has no energy, more fatigue than anything else. He had a sinue infection and Dr. Ernesto Rutherford at ENT treated it and no more dry mouth in the morning or needing water at night, not dozing off a lot during the day, no snoring, no indication for sleep eval. Reviwed findings below with patient.    This MRI of the brain with and without contrast 02/21/2016 shows the following: 1.   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 Dedicated pituitary imaging with MRI and endocrine labs are recommended to better assess for the possibility of a microadenoma. 2.  There is mild age appropriate cortical atrophy and minimal age-appropriate chronic microvascular ischemic change. 3.  There are no acute findings.  HPI: Adrian Matthews is a 70 y.o. male here as a referral from Dr. Mariea Clonts for headaches.PMHx HTN, HLD, morbid obesity, CKD, New daily persistent headache, monoclonal gammopathy, fatigue. Just started 2 months agi. Never had headaches before. The headaches are continuous. No inciting events, no head trauma, new medications. He first noticed the headaches in early march and he thought they were  sinus related, he is having a lot of sinus problems, He just noticed it one day nothing happened or any special inciting events. They got worse and he sent to the ED at the end of March, he was in tears. The end of April headaches got better. He has pain in the back on the left side of the head. It is throbbing (points to the left occipital area), hurts to lay on, not sensitivie to touch. If he lays his head back in the wrong way it hurts but denies any neck pain. It is throbbing right now. Very localized to the back part of the head. No vision changes, no jaw pain, no fevers or systemic signs, no neck pain. He does not know if her snores but he wakes up with dry mouth, he has 5 bottles of water on the side of his bed, he is not tired during the day, he goes to gym at 5am and he takes a nap and then go to bed at 9pm but he wakes up every morning with headaches. He wakes up coughing at night. Mostly in the morning but his mouth can get dry otherwise. He wakes with the headaches every morning. He is morbidly obese. No photophobia, nausea.   Reviewed notes, labs and imaging from outside physicians, which showed:  CT of the head 01/01/2016 (personally reviewed images, also reviewed with patient, and agree with the following): FINDINGS: The ventricles are mildly prominent in a generalized manner, a stable finding. The sulci appear within normal limits. There is no intracranial mass, hemorrhage, extra-axial fluid collection, or midline shift.  There is rather minimal small vessel disease in the centra semiovale bilaterally. Elsewhere, gray-white compartments appear normal. No acute infarct evident. The bony calvarium appears intact. The mastoid air cells are clear. There is opacification of much of the right sphenoid sinus region. No intraorbital lesions are evident.  IMPRESSION: Stable mild generalized ventricular prominence. The sulci appear normal. The significance of this mild ventricular prominence  is uncertain. Question any clinical findings suggesting potential normal pressure hydrocephalus.  There is minimal periventricular small vessel disease. No acute infarct evident. No hemorrhage or mass effect.  There is right sphenoid sinus opacification.  BMP with creatinins 1.19, hgba1c 6.7  Review of Systems: Patient complains of symptoms per HPI as well as the following symptoms: cough, no CP, no abdominal pain, no fever or chills, weight stable. Pertinent negatives per HPI. All others negative.  Social History   Social History  . Marital Status: Single    Spouse Name: N/A  . Number of Children: 0  . Years of Education: 16   Occupational History  . Retired     Social History Main Topics  . Smoking status: Never Smoker   . Smokeless tobacco: Never Used  . Alcohol Use: No  . Drug Use: No  . Sexual Activity: Not on file   Other Topics Concern  . Not on file   Social History Narrative   Lives alone.   Caffeine use: Coffee (1 cup/morning)   Soda (rare)    Family History  Problem Relation Age of Onset  . Hypertension Sister   . Hypertension Brother   . Diabetes Brother   . Hypertension Brother   . Hypertension Sister   . Migraines Neg Hx     Past Medical History  Diagnosis Date  . DM (diabetes mellitus) ([place])   . Hypertension   . Hyperlipidemia   . Osteoarthrosis, unspecified whether generalized or localized, lower leg   . Primary localized osteoarthrosis, unspecified site   . Asymptomatic varicose veins   . New daily persistent headache   . Unspecified hereditary and idiopathic peripheral neuropathy   . Chronic kidney disease   . Monoclonal paraproteinemia   . Gout, unspecified   . Hypercalcemia   . Hyperosmolality and/or hypernatremia   . Diaphragmatic hernia without mention of obstruction or gangrene   . Unspecified disorder of kidney and ureter   . Other malaise and fatigue   . GERD (gastroesophageal reflux disease)     Past Surgical History    Procedure Laterality Date  . Carpal tunnel release  2007  . Colonoscopy N/A 06/06/2013    Procedure: COLONOSCOPY;  Surgeon: Cleotis Nipper, MD;  Location: [facility] ENDOSCOPY;  Service: Endoscopy;  Laterality: N/A;    Current Outpatient Prescriptions  Medication Sig Dispense Refill  . amLODipine (NORVASC) 10 MG tablet TAKE 1 TABLET DAILY FOR BLOOD PRESSURE 90 tablet 0  . atorvastatin (LIPITOR) 40 MG tablet TAKE 1 TABLET DAILY 90 tablet 1  . chlorthalidone (HYGROTON) 25 MG tablet TAKE 1 TABLET DAILY FOR BLOOD PRESSURE 90 tablet 1  . FREESTYLE LITE test strip CHECK BLOOD SUGAR THREE TIMES A DAY 300 each 1  . Insulin Glargine (LANTUS SOLOSTAR) 100 UNIT/ML Solostar Pen Inject 60 Units into the skin daily at 10 pm. 20 pen 6  . insulin lispro (HUMALOG KWIKPEN) 100 UNIT/ML KiwkPen Inject 22 units before breakfast, 26 units before lunch & supper - plus sliding scale. 30 pen 3  . Iron-FA-B Cmp-C-Biot-Probiotic (FUSION PLUS) CAPS Take 1 capsule by mouth every other  day.    . losartan (COZAAR) 100 MG tablet TAKE 1 TABLET DAILY 90 tablet 2  . NEXIUM 40 MG capsule Take 40 mg by mouth daily at 12 noon.     . pantoprazole (PROTONIX) 40 MG tablet Take 1 tablet (40 mg total) by mouth daily. 90 tablet 3  . potassium chloride SA (K-DUR,KLOR-CON) 20 MEQ tablet Take 1 tablet (20 mEq total) by mouth 2 (two) times daily. 180 tablet 3  . SURE COMFORT PEN NEEDLES 32G X 4 MM MISC USE FOUR TIMES A DAY BEFORE MEALS 500 each 2  . ZOLMitriptan (ZOMIG) 2.5 MG tablet Take 1 tablet (2.5 mg total) by mouth once. May repeat in 2 hours if headache persists or recurs. 9 tablet 3   No current facility-administered medications for this visit.    Allergies as of 04/07/2016  . (No Known Allergies)    Vitals: BP 122/71 mmHg  Pulse 73  Ht _0  (1.803 m)  Wt 300 lb (136.079 kg)  BMI 41.86 kg/m2 Last Weight:  Wt Readings from Last 1 Encounters:  04/07/16 300 lb (136.079 kg)   Last Height:   Ht Readings from Last 1  Encounters:  04/07/16 _1  (1.803 m)     Physical exam: Exam: Gen: NAD, conversant, well nourised, mobidly obese, well groomed  CV: RRR, no MRG. No Carotid Bruits. No peripheral edema, warm, nontender Eyes: Conjunctivae clear without exudates or hemorrhage  Neuro: Detailed Neurologic Exam  Speech:  Speech is normal; fluent and spontaneous with normal comprehension.  Cognition:  The patient is oriented to person, place, and time;   recent and remote memory intact;   language fluent;   normal attention, concentration,   fund of knowledge Cranial Nerves:  The pupils are equal, round, and reactive to light. The fundi are without edema. Visual fields are full to finger confrontation. Extraocular movements are intact. Trigeminal sensation is intact and the muscles of mastication are normal. The face is symmetric. The palate elevates in the midline. Hearing intact. Voice is normal. Shoulder shrug is normal. The tongue has normal motion without fasciculations.   Coordination:  Normal finger to nose and heel to shin. Normal rapid alternating movements.   Gait:  Heel-toe gait are normal. Wide based due to very large body habitus.   Motor Observation:  No asymmetry, no atrophy, and no involuntary movements noted. Tone:  Normal muscle tone.   Posture:  Posture is normal. normal erect   Strength:  Strength is V/V in the upper and lower limbs.    Sensation: intact to LT   Reflex Exam:  DTR's:  Deep tendon reflexes in the upper extremities are normal bilaterally, hypo in the patellars and absent in the achilles  Toes:  The toes are downgoing bilaterally.  Clonus:  Clonus is absent.    Assessment/Plan: 70 y.o. male here as a referral from Dr. Mariea Clonts for headaches.PMHx HTN, HLD, morbid obesity, CKD, New daily persistent headache, monoclonal gammopathy, fatigue. New onset headaches localized to the left  occipital area. Pain is localized to the area of the occipital nerve, need further imaging, will perform a nerve block today.  - Occipital nerve blocks 8 weeks ago helped a lot, he is feeling better, declines repeat blocks today - Mri brain w/wo contrast: pituitary enlargement with elevated prolactin, appt with endocrinology upcoming - ENT evaluation for sinus congestion and sphenoid density on CT of the head: Following with Dr. Ernesto Rutherford -I had suggested Sleep evaluation for morning dry mouth and headaches to  evaluate for possible OSA or hypoventilation obesity syndrome but symptoms improved with treatment of sinus infection by Dr. Ernesto Rutherford and denies excessive sleepiness int he da so will hold off.  - F/u in if headaches return can come back sooner for another block or to discuss new medication -ESR and CRP to eval for temporal arteritis however does not fit this syndrome, were normal.  - HgbA1c 6.9, follow with pcp - TSH, ILGF, LH, FSH all normal.  - f/u as needed  To prevent or relieve headaches, try the following:  Cool Compress. Lie down and place a cool compress on your head.   Avoid headache triggers. If certain foods or odors seem to have triggered your migraines in the past, avoid them. A headache diary might help you identify triggers.   Include physical activity in your daily routine. Try a daily walk or other moderate aerobic exercise.   Manage stress. Find healthy ways to cope with the stressors, such as delegating tasks on your to-do list.   Practice relaxation techniques. Try deep breathing, yoga, massage and visualization.   Eat regularly. Eating regularly scheduled meals and maintaining a healthy diet might help prevent headaches. Also, drink plenty of fluids.   Follow a regular sleep schedule. Sleep deprivation might contribute to headaches  Consider biofeedback. With this mind-body technique, you learn to control certain bodily functions - such as muscle tension,  heart rate and blood pressure - to prevent headaches or reduce headache pain.   Proceed to emergency room if you experience new or worsening symptoms or symptoms do not resolve, if you have new neurologic symptoms or if headache is severe, or for any concerning symptom.  Sarina Ill, MD  [facility] [address] [place], [place] [address]  Phone [phone] Fax [phone]  A total of 15 minutes was spent face-to-face with this patient. Over half this time was spent on counseling patient on the prolactinoma, occipital neuralgia diagnosis and different diagnostic and therapeutic options available.

## 2016-04-13 ENCOUNTER — Other Ambulatory Visit: Payer: Self-pay | Admitting: Internal Medicine

## 2016-04-22 ENCOUNTER — Ambulatory Visit
Admission: RE | Admit: 2016-04-22 | Discharge: 2016-04-22 | Disposition: A | Discharge status: Home / Self Care | Payer: Medicare Other | Source: Ambulatory Visit | Attending: Otolaryngology | Admitting: Otolaryngology

## 2016-04-22 ENCOUNTER — Other Ambulatory Visit: Payer: Self-pay | Admitting: Otolaryngology

## 2016-04-22 DIAGNOSIS — R05 Cough: Secondary | ICD-10-CM | POA: Diagnosis not present

## 2016-04-22 DIAGNOSIS — J323 Chronic sphenoidal sinusitis: Secondary | ICD-10-CM | POA: Diagnosis not present

## 2016-04-22 DIAGNOSIS — J301 Allergic rhinitis due to pollen: Secondary | ICD-10-CM | POA: Diagnosis not present

## 2016-04-22 DIAGNOSIS — J0131 Acute recurrent sphenoidal sinusitis: Secondary | ICD-10-CM

## 2016-04-22 DIAGNOSIS — J3081 Allergic rhinitis due to animal (cat) (dog) hair and dander: Secondary | ICD-10-CM | POA: Diagnosis not present

## 2016-04-22 DIAGNOSIS — J040 Acute laryngitis: Secondary | ICD-10-CM | POA: Diagnosis not present

## 2016-04-22 DIAGNOSIS — J019 Acute sinusitis, unspecified: Secondary | ICD-10-CM | POA: Diagnosis not present

## 2016-04-24 DIAGNOSIS — E1121 Type 2 diabetes mellitus with diabetic nephropathy: Secondary | ICD-10-CM | POA: Insufficient documentation

## 2016-04-24 DIAGNOSIS — Z6841 Body Mass Index (BMI) 40.0 and over, adult: Secondary | ICD-10-CM | POA: Diagnosis not present

## 2016-04-24 DIAGNOSIS — D352 Benign neoplasm of pituitary gland: Secondary | ICD-10-CM | POA: Diagnosis not present

## 2016-04-24 DIAGNOSIS — D126 Benign neoplasm of colon, unspecified: Secondary | ICD-10-CM | POA: Diagnosis not present

## 2016-04-24 DIAGNOSIS — E1129 Type 2 diabetes mellitus with other diabetic kidney complication: Secondary | ICD-10-CM | POA: Diagnosis not present

## 2016-04-24 DIAGNOSIS — E221 Hyperprolactinemia: Secondary | ICD-10-CM | POA: Diagnosis not present

## 2016-04-24 DIAGNOSIS — N08 Glomerular disorders in diseases classified elsewhere: Secondary | ICD-10-CM | POA: Diagnosis not present

## 2016-04-25 ENCOUNTER — Other Ambulatory Visit (HOSPITAL_COMMUNITY): Payer: Self-pay | Admitting: Endocrinology

## 2016-04-25 DIAGNOSIS — D352 Benign neoplasm of pituitary gland: Secondary | ICD-10-CM

## 2016-05-06 ENCOUNTER — Ambulatory Visit (HOSPITAL_COMMUNITY)
Admission: RE | Admit: 2016-05-06 | Discharge: 2016-05-06 | Disposition: A | Discharge status: Home / Self Care | Payer: Medicare Other | Source: Ambulatory Visit | Attending: Endocrinology | Admitting: Endocrinology

## 2016-05-06 DIAGNOSIS — E221 Hyperprolactinemia: Secondary | ICD-10-CM | POA: Diagnosis not present

## 2016-05-06 DIAGNOSIS — D352 Benign neoplasm of pituitary gland: Secondary | ICD-10-CM | POA: Insufficient documentation

## 2016-05-06 LAB — CREATININE, SERUM
Creatinine, Ser: 1.46 mg/dL — ABNORMAL HIGH (ref 0.61–1.24)
GFR calc Af Amer: 55 mL/min — ABNORMAL LOW
GFR calc non Af Amer: 47 mL/min — ABNORMAL LOW

## 2016-05-06 MED ORDER — GADOBENATE DIMEGLUMINE 529 MG/ML IV SOLN
20.0000 mL | Freq: Once | INTRAVENOUS | Status: AC
  Administered 2016-05-06: 10 mL via INTRAVENOUS

## 2016-05-07 ENCOUNTER — Telehealth: Payer: Self-pay | Admitting: *Deleted

## 2016-05-07 NOTE — Telephone Encounter (Signed)
Patient called and stated that he has an appointment with you next month but he is going to [facility] to be under there care for a while. No details given, he stated that if you wanted to know more you can call him. He canceled all appointment and will call us back when released.

## 2016-05-07 NOTE — Telephone Encounter (Signed)
Noted.  I spoke with him.  He will try to get updated information for [place] when he returns

## 2016-06-02 ENCOUNTER — Other Ambulatory Visit: Payer: Self-pay | Admitting: Internal Medicine

## 2016-06-06 ENCOUNTER — Ambulatory Visit: Payer: Medicare Other | Admitting: Internal Medicine

## 2016-06-09 ENCOUNTER — Other Ambulatory Visit: Payer: Self-pay | Admitting: Pharmacotherapy

## 2016-06-09 DIAGNOSIS — E876 Hypokalemia: Secondary | ICD-10-CM

## 2016-06-20 DIAGNOSIS — D352 Benign neoplasm of pituitary gland: Secondary | ICD-10-CM | POA: Diagnosis not present

## 2016-06-20 DIAGNOSIS — E119 Type 2 diabetes mellitus without complications: Secondary | ICD-10-CM | POA: Diagnosis not present

## 2016-06-20 LAB — HM DIABETES EYE EXAM

## 2016-06-21 ENCOUNTER — Other Ambulatory Visit: Payer: Self-pay | Admitting: Internal Medicine

## 2016-06-22 ENCOUNTER — Other Ambulatory Visit: Payer: Self-pay | Admitting: Internal Medicine

## 2016-06-23 NOTE — Telephone Encounter (Signed)
Spoke with patient, patient cancelled all pending appointments, patient states Dr.Reed knows his situation. Patient did not wish to reschedule follow-up at this time.  Please advise on how many refills authorized

## 2016-06-24 ENCOUNTER — Encounter: Payer: Self-pay | Admitting: *Deleted

## 2016-07-08 DIAGNOSIS — D352 Benign neoplasm of pituitary gland: Secondary | ICD-10-CM | POA: Diagnosis not present

## 2016-07-08 DIAGNOSIS — E291 Testicular hypofunction: Secondary | ICD-10-CM | POA: Diagnosis not present

## 2016-07-09 DIAGNOSIS — R51 Headache: Secondary | ICD-10-CM | POA: Diagnosis not present

## 2016-07-09 DIAGNOSIS — D352 Benign neoplasm of pituitary gland: Secondary | ICD-10-CM | POA: Diagnosis not present

## 2016-07-16 ENCOUNTER — Other Ambulatory Visit: Payer: Medicare Other

## 2016-07-21 ENCOUNTER — Ambulatory Visit: Payer: Medicare Other | Admitting: Pharmacotherapy

## 2016-07-29 DIAGNOSIS — D352 Benign neoplasm of pituitary gland: Secondary | ICD-10-CM | POA: Diagnosis not present

## 2016-07-29 DIAGNOSIS — N183 Chronic kidney disease, stage 3 (moderate): Secondary | ICD-10-CM | POA: Diagnosis not present

## 2016-07-29 DIAGNOSIS — J301 Allergic rhinitis due to pollen: Secondary | ICD-10-CM | POA: Diagnosis not present

## 2016-07-29 DIAGNOSIS — N189 Chronic kidney disease, unspecified: Secondary | ICD-10-CM | POA: Diagnosis not present

## 2016-07-29 DIAGNOSIS — J323 Chronic sphenoidal sinusitis: Secondary | ICD-10-CM | POA: Diagnosis not present

## 2016-07-29 DIAGNOSIS — Z8639 Personal history of other endocrine, nutritional and metabolic disease: Secondary | ICD-10-CM | POA: Diagnosis not present

## 2016-07-29 DIAGNOSIS — J3081 Allergic rhinitis due to animal (cat) (dog) hair and dander: Secondary | ICD-10-CM | POA: Diagnosis not present

## 2016-08-04 DIAGNOSIS — N183 Chronic kidney disease, stage 3 (moderate): Secondary | ICD-10-CM | POA: Diagnosis not present

## 2016-08-04 DIAGNOSIS — D631 Anemia in chronic kidney disease: Secondary | ICD-10-CM | POA: Diagnosis not present

## 2016-08-04 DIAGNOSIS — Z23 Encounter for immunization: Secondary | ICD-10-CM | POA: Diagnosis not present

## 2016-08-04 DIAGNOSIS — I129 Hypertensive chronic kidney disease with stage 1 through stage 4 chronic kidney disease, or unspecified chronic kidney disease: Secondary | ICD-10-CM | POA: Diagnosis not present

## 2016-08-04 DIAGNOSIS — Z8639 Personal history of other endocrine, nutritional and metabolic disease: Secondary | ICD-10-CM | POA: Diagnosis not present

## 2016-08-04 DIAGNOSIS — Z6841 Body Mass Index (BMI) 40.0 and over, adult: Secondary | ICD-10-CM | POA: Diagnosis not present

## 2016-08-20 DIAGNOSIS — D353 Benign neoplasm of craniopharyngeal duct: Secondary | ICD-10-CM | POA: Diagnosis not present

## 2016-08-20 DIAGNOSIS — R918 Other nonspecific abnormal finding of lung field: Secondary | ICD-10-CM | POA: Diagnosis not present

## 2016-08-20 DIAGNOSIS — R05 Cough: Secondary | ICD-10-CM | POA: Diagnosis not present

## 2016-08-20 DIAGNOSIS — D497 Neoplasm of unspecified behavior of endocrine glands and other parts of nervous system: Secondary | ICD-10-CM | POA: Diagnosis not present

## 2016-08-20 DIAGNOSIS — Z01818 Encounter for other preprocedural examination: Secondary | ICD-10-CM | POA: Diagnosis not present

## 2016-08-20 DIAGNOSIS — I4519 Other right bundle-branch block: Secondary | ICD-10-CM | POA: Diagnosis not present

## 2016-08-20 DIAGNOSIS — M2550 Pain in unspecified joint: Secondary | ICD-10-CM | POA: Diagnosis not present

## 2016-08-20 DIAGNOSIS — Z79899 Other long term (current) drug therapy: Secondary | ICD-10-CM | POA: Diagnosis not present

## 2016-08-20 DIAGNOSIS — I10 Essential (primary) hypertension: Secondary | ICD-10-CM | POA: Diagnosis not present

## 2016-08-20 DIAGNOSIS — D352 Benign neoplasm of pituitary gland: Secondary | ICD-10-CM | POA: Diagnosis not present

## 2016-08-20 DIAGNOSIS — Z794 Long term (current) use of insulin: Secondary | ICD-10-CM | POA: Diagnosis not present

## 2016-08-20 DIAGNOSIS — E1140 Type 2 diabetes mellitus with diabetic neuropathy, unspecified: Secondary | ICD-10-CM | POA: Diagnosis not present

## 2016-08-20 DIAGNOSIS — I444 Left anterior fascicular block: Secondary | ICD-10-CM | POA: Diagnosis not present

## 2016-09-17 ENCOUNTER — Encounter: Payer: Self-pay | Admitting: Internal Medicine

## 2016-09-25 DIAGNOSIS — D353 Benign neoplasm of craniopharyngeal duct: Secondary | ICD-10-CM | POA: Diagnosis not present

## 2016-09-25 DIAGNOSIS — I129 Hypertensive chronic kidney disease with stage 1 through stage 4 chronic kidney disease, or unspecified chronic kidney disease: Secondary | ICD-10-CM | POA: Diagnosis present

## 2016-09-25 DIAGNOSIS — D352 Benign neoplasm of pituitary gland: Secondary | ICD-10-CM | POA: Diagnosis not present

## 2016-09-25 DIAGNOSIS — E291 Testicular hypofunction: Secondary | ICD-10-CM | POA: Diagnosis not present

## 2016-09-25 DIAGNOSIS — Z9189 Other specified personal risk factors, not elsewhere classified: Secondary | ICD-10-CM | POA: Insufficient documentation

## 2016-09-25 DIAGNOSIS — E1142 Type 2 diabetes mellitus with diabetic polyneuropathy: Secondary | ICD-10-CM | POA: Diagnosis present

## 2016-09-25 DIAGNOSIS — Z9889 Other specified postprocedural states: Secondary | ICD-10-CM | POA: Diagnosis not present

## 2016-09-25 DIAGNOSIS — E236 Other disorders of pituitary gland: Secondary | ICD-10-CM | POA: Diagnosis not present

## 2016-09-25 DIAGNOSIS — E1165 Type 2 diabetes mellitus with hyperglycemia: Secondary | ICD-10-CM | POA: Diagnosis present

## 2016-09-25 DIAGNOSIS — N182 Chronic kidney disease, stage 2 (mild): Secondary | ICD-10-CM | POA: Diagnosis present

## 2016-09-25 DIAGNOSIS — E785 Hyperlipidemia, unspecified: Secondary | ICD-10-CM | POA: Diagnosis present

## 2016-09-25 DIAGNOSIS — E230 Hypopituitarism: Secondary | ICD-10-CM | POA: Diagnosis present

## 2016-09-25 DIAGNOSIS — E1122 Type 2 diabetes mellitus with diabetic chronic kidney disease: Secondary | ICD-10-CM | POA: Diagnosis present

## 2016-09-25 DIAGNOSIS — Z794 Long term (current) use of insulin: Secondary | ICD-10-CM | POA: Diagnosis not present

## 2016-09-25 DIAGNOSIS — I444 Left anterior fascicular block: Secondary | ICD-10-CM | POA: Diagnosis not present

## 2016-09-25 DIAGNOSIS — Z86018 Personal history of other benign neoplasm: Secondary | ICD-10-CM | POA: Diagnosis not present

## 2016-09-25 DIAGNOSIS — K219 Gastro-esophageal reflux disease without esophagitis: Secondary | ICD-10-CM | POA: Diagnosis present

## 2016-09-25 DIAGNOSIS — I4519 Other right bundle-branch block: Secondary | ICD-10-CM | POA: Diagnosis not present

## 2016-10-02 ENCOUNTER — Encounter: Payer: Self-pay | Admitting: Internal Medicine

## 2016-10-02 ENCOUNTER — Ambulatory Visit (INDEPENDENT_AMBULATORY_CARE_PROVIDER_SITE_OTHER): Payer: Medicare Other | Admitting: Internal Medicine

## 2016-10-02 VITALS — BP 120/80 | HR 84 | Temp 98.2°F | Wt 301.0 lb

## 2016-10-02 DIAGNOSIS — Z794 Long term (current) use of insulin: Secondary | ICD-10-CM

## 2016-10-02 DIAGNOSIS — E0822 Diabetes mellitus due to underlying condition with diabetic chronic kidney disease: Secondary | ICD-10-CM

## 2016-10-02 DIAGNOSIS — N182 Chronic kidney disease, stage 2 (mild): Secondary | ICD-10-CM

## 2016-10-02 DIAGNOSIS — D352 Benign neoplasm of pituitary gland: Secondary | ICD-10-CM

## 2016-10-02 DIAGNOSIS — M159 Polyosteoarthritis, unspecified: Secondary | ICD-10-CM

## 2016-10-02 DIAGNOSIS — E229 Hyperfunction of pituitary gland, unspecified: Secondary | ICD-10-CM | POA: Diagnosis not present

## 2016-10-02 DIAGNOSIS — E876 Hypokalemia: Secondary | ICD-10-CM | POA: Diagnosis not present

## 2016-10-02 DIAGNOSIS — D509 Iron deficiency anemia, unspecified: Secondary | ICD-10-CM

## 2016-10-02 DIAGNOSIS — I10 Essential (primary) hypertension: Secondary | ICD-10-CM | POA: Diagnosis not present

## 2016-10-02 DIAGNOSIS — E66813 Obesity, class 3: Secondary | ICD-10-CM

## 2016-10-02 MED ORDER — SODIUM CHLORIDE 0.65 % NA SOLN: NASAL | 3 refills | Status: DC

## 2016-10-02 NOTE — Progress Notes (Signed)
Location:  [facility] clinic Provider:  Jamario Colina L. Mariea Clonts, D.O., C.M.D.  Code Status: full code Goals of Care:  Advanced Directives 03/06/2016  Does Patient Have a Medical Advance Directive? No  Type of Advance Directive -  Does patient want to make changes to medical advance directive? -  Copy of [place] in Chart? -  Would patient like information on creating a medical advance directive? -  Pre-existing out of facility DNR order (yellow form or pink MOST form) -     Chief Complaint  Patient presents with  . Follow-up    discuss surgery and wants labs    HPI: Patient is a 70 y.o. 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  He saw the neurologist, then the endocrinologist, then finally went and had the pituitary surgery.  It was supposed to be in November.  Was not told to hold his asa and fish oil, so was delayed.  Had more labs and blood, then had the surgery.  Has been home since Saturday.  Dr. Opal Sidles thought it would not come back ( per pt). His brother stuck by him the whole time. 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  He gets tired quick.  His head is sensitive--he can't wash it or shower.  He has his pain pills.  Not using the oxycodone (had one in hospital and that was it).  Using moderate pain pills Dr. Eulas Post had given him for his back.  Gets to where he needs to go lie down.  He is having a lot of drainage now and breathing much better.  Horrendous headaches are gone.    Arthritis better these days.    Still having some hot spells.  Can walk around in short sleeves.    Feels good, eating well, food tastes better.    He had a low potassium at the hospital.  He said he was not getting his regular meds and insulin and his glucose was elevated.  Is for bmp and am cortisol at 8am--counseled that he would have had to do this first thing this  am, but will come back for it.  He follows up in 2 mos for a f/u MRI.  It's 3 hrs away.    Past Medical History:  Diagnosis Date  . Asymptomatic varicose veins   . Chronic kidney disease   . Diaphragmatic hernia without mention of obstruction or gangrene   . DM (diabetes mellitus) ([place])   . GERD (gastroesophageal reflux disease)   . Gout, unspecified   . Hypercalcemia   . Hyperlipidemia   . Hyperosmolality and/or hypernatremia   . Hypertension   . Monoclonal paraproteinemia   . New daily persistent headache   . Osteoarthrosis, unspecified whether generalized or localized, lower leg   . Other malaise and fatigue   . Primary localized osteoarthrosis, unspecified site   . Unspecified disorder of kidney and ureter   . Unspecified hereditary and idiopathic peripheral neuropathy     Past Surgical History:  Procedure Laterality Date  . CARPAL TUNNEL RELEASE  2007  . COLONOSCOPY N/A 06/06/2013   Procedure: COLONOSCOPY;  Surgeon: Cleotis Nipper, MD;  Location: [facility] ENDOSCOPY;  Service: Endoscopy;  Laterality: N/A;    No Known Allergies  Allergies as of 10/02/2016   No Known Allergies     Medication List       Accurate as of 10/02/16  3:41 PM. Always use your most recent med list.          amLODipine 10 MG tablet Commonly known as:  NORVASC TAKE 1 TABLET DAILY FOR BLOOD PRESSURE   atorvastatin 40 MG tablet Commonly known as:  LIPITOR TAKE 1 TABLET DAILY   chlorthalidone 25 MG tablet Commonly known as:  HYGROTON TAKE 1 TABLET DAILY FOR BLOOD PRESSURE   FREESTYLE LITE test strip Generic drug:  glucose blood CHECK BLOOD SUGAR THREE TIMES A DAY   FUSION PLUS Caps Take 1 capsule by mouth every other day.   Insulin Glargine 100 UNIT/ML Solostar Pen Commonly known as:  LANTUS SOLOSTAR Inject 60 Units into the skin daily at 10 pm.   insulin lispro 100 UNIT/ML KiwkPen Commonly known as:  HUMALOG KWIKPEN Inject 22 units before breakfast, 26 units before lunch & supper  - plus sliding scale.   KLOR-CON M20 20 MEQ tablet Generic drug:  potassium chloride SA TAKE 1 TABLET TWICE A DAY   losartan 100 MG tablet Commonly known as:  COZAAR TAKE 1 TABLET DAILY   NEXIUM 40 MG capsule Generic drug:  esomeprazole Take 40 mg by mouth daily at 12 noon.   oxyCODONE 5 MG immediate release tablet Commonly known as:  Oxy IR/ROXICODONE   pantoprazole 40 MG tablet Commonly known as:  PROTONIX Take 1 tablet (40 mg total) by mouth daily.   sodium chloride 0.65 % nasal spray Commonly known as:  OCEAN 1 spray each nare every 1 hour while awake for 2 weeks then 1 spray each nare every 3-4 hours for 3 months   SURE COMFORT PEN NEEDLES 32G X 4 MM Misc Generic drug:  Insulin Pen Needle USE FOUR TIMES A DAY BEFORE MEALS   ZOLMitriptan 2.5 MG tablet Commonly known as:  ZOMIG Take 1 tablet (2.5 mg total) by mouth once. May repeat in 2 hours if headache persists or recurs.       Review of Systems:  Review of Systems  Constitutional: Positive for malaise/fatigue. Negative for chills and fever.  HENT: Positive for congestion.        Headaches better; rhinorrhea  Eyes: Negative for blurred vision.  Respiratory: Negative for shortness of breath.   Cardiovascular: Negative for chest pain and palpitations.  Gastrointestinal: Negative for abdominal pain, blood in stool, constipation and melena.  Genitourinary: Negative for dysuria.  Musculoskeletal: Negative for falls and joint pain.  Skin: Negative for itching and rash.  Neurological: Negative for dizziness, loss of consciousness, weakness and headaches.  Endo/Heme/Allergies:       Pituitary adenoma s/p resection  Psychiatric/Behavioral: Negative for depression and memory loss. The patient does not have insomnia.     Health Maintenance  Topic Date Due  . Hepatitis C Screening  08-Oct-1946  . HEMOGLOBIN A1C  09/11/2016  . FOOT EXAM  03/06/2017  . OPHTHALMOLOGY EXAM  06/20/2017  . TETANUS/TDAP  04/13/2023  .  COLONOSCOPY  06/07/2023  . INFLUENZA VACCINE  Completed  . ZOSTAVAX  Completed  . PNA vac Low Risk Adult  Completed    Physical Exam: Vitals:   10/02/16 1506  BP: 120/80  Pulse: 84  Temp: 98.2 F (36.8 C)  TempSrc: Oral  SpO2: 98%  Weight: (!) 301 lb (136.5 kg)   Body mass index is 41.98 kg/m. Physical Exam  Constitutional: He is oriented to person, place, and time. He appears well-developed and well-nourished. No distress.  HENT:  Mild swelling over nose and rhinorrhea  Eyes: EOM are normal. Pupils  are equal, round, and reactive to light.  Cardiovascular: Normal rate, regular rhythm, normal heart sounds and intact distal pulses.   Pulmonary/Chest: Effort normal and breath sounds normal. No respiratory distress.  Abdominal: Soft. Bowel sounds are normal.  Musculoskeletal: Normal range of motion.  Neurological: He is alert and oriented to person, place, and time.  Skin: Skin is warm and dry.  Psychiatric: He has a normal mood and affect. His behavior is normal. Judgment and thought content normal.    Labs reviewed: Basic Metabolic Panel:  Recent Labs  01/01/16 0014 02/26/16 1420 03/12/16 0815 05/06/16 1745  NA 130* 138 137  --   K 3.0* 3.7 3.8  --   CL 89* 95* 94*  --   CO2 26 25 27   --   GLUCOSE 166* 113* 67  --   BUN 12 26 23   --   CREATININE 1.19 1.56* 1.29* 1.46*  CALCIUM 9.9 9.7 9.8  --   TSH  --  2.520  --   --    Liver Function Tests:  Recent Labs  10/19/15 0810 02/26/16 1420 03/12/16 0815  AST 25 17 21   ALT 23 15 19   ALKPHOS 81 101 106  BILITOT 0.3 <0.2 <0.2  PROT 7.1 7.4 7.3  ALBUMIN 4.0 4.4 4.0   No results for input(s): LIPASE, AMYLASE in the last 8760 hours. No results for input(s): AMMONIA in the last 8760 hours. CBC:  Recent Labs  01/01/16 0014 02/26/16 1420  WBC 10.3 12.6*  HGB 12.2*  --   HCT 36.4* 38.5  MCV 77.9* 81  PLT 212 261   Lipid Panel: No results for input(s): CHOL, HDL, LDLCALC, TRIG, CHOLHDL, LDLDIRECT in the  last 8760 hours. Lab Results  Component Value Date   HGBA1C 6.9 (H) 03/12/2016    Assessment/Plan 1. Diabetes mellitus due to underlying condition, controlled, with stage 2 chronic kidney disease, with long-term current use of insulin ([place]) - says control will be poor he says due to his sugars during his hospitalization/perioperative period - Hemoglobin A1c; Future - Lipid panel; Future  2. Essential hypertension - bp well controlled, cont same regimen - COMPLETE METABOLIC PANEL WITH GFR; Future  3. Pituitary microadenoma with hyperprolactinemia ([place]) - s/p resection--headaches resolved, but head now very sensitive - f/u labs in am and will do his regular labs also - Cortisol-am, blood; Future - TSH; Future  4. Obesity, Class III, BMI 40-49.9 (morbid obesity) ([place]) - getting back on his diet, not ready to go back to the gym yet - Lipid panel; Future  5. Generalized osteoarthritis of multiple sites -better lately, monitor  6. Iron deficiency anemia, unspecified iron deficiency anemia type - f/u labs: - CBC with Differential/Platelet; Future  7. Hypokalemia -during hospitalization, f/u labs: - COMPLETE METABOLIC PANEL WITH GFR; Future  Labs/tests ordered:   Orders Placed This Encounter  Procedures  . Cortisol-am, blood    Standing Status:   Future    Standing Expiration Date:   01/01/2017  . COMPLETE METABOLIC PANEL WITH GFR    [facility]    Standing Status:   Future    Standing Expiration Date:   01/01/2017  . Hemoglobin A1c    Standing Status:   Future    Standing Expiration Date:   01/01/2017  . Lipid panel    Standing Status:   Future    Standing Expiration Date:   01/01/2017    Order Specific Question:   Has the patient fasted?    Answer:  Yes  . CBC with Differential/Platelet    Standing Status:   Future    Standing Expiration Date:   01/01/2017  . TSH    Standing Status:   Future    Standing Expiration Date:   01/01/2017    Next appt:  Labs in am, see  me in 3 mos, [place] in next few weeks   Rakiyah Esch L. Nazario Russom, D.O. [facility] [address], [place] [address] Cell Phone (Mon-Fri 8am-5pm):  [phone] On Call:  [phone] & follow prompts after 5pm & weekends Office Phone:  [phone] Office Fax:  [phone]

## 2016-10-03 ENCOUNTER — Other Ambulatory Visit: Payer: Medicare Other

## 2016-10-03 DIAGNOSIS — D509 Iron deficiency anemia, unspecified: Secondary | ICD-10-CM

## 2016-10-03 DIAGNOSIS — Z794 Long term (current) use of insulin: Secondary | ICD-10-CM

## 2016-10-03 DIAGNOSIS — D352 Benign neoplasm of pituitary gland: Secondary | ICD-10-CM | POA: Diagnosis not present

## 2016-10-03 DIAGNOSIS — E0822 Diabetes mellitus due to underlying condition with diabetic chronic kidney disease: Secondary | ICD-10-CM

## 2016-10-03 DIAGNOSIS — E229 Hyperfunction of pituitary gland, unspecified: Principal | ICD-10-CM

## 2016-10-03 DIAGNOSIS — N182 Chronic kidney disease, stage 2 (mild): Secondary | ICD-10-CM | POA: Diagnosis not present

## 2016-10-03 DIAGNOSIS — I10 Essential (primary) hypertension: Secondary | ICD-10-CM

## 2016-10-03 DIAGNOSIS — E876 Hypokalemia: Secondary | ICD-10-CM | POA: Diagnosis not present

## 2016-10-03 LAB — HEMOGLOBIN A1C
Hgb A1c MFr Bld: 7.1 % — ABNORMAL HIGH (ref <5.7)
Mean Plasma Glucose: 157 mg/dL

## 2016-10-03 LAB — CBC WITH DIFFERENTIAL/PLATELET
Basophils Absolute: 0 cells/uL (ref 0–200)
Basophils Relative: 0 %
Eosinophils Absolute: 99 cells/uL (ref 15–500)
Eosinophils Relative: 1 %
HCT: 35.8 % — ABNORMAL LOW (ref 38.5–50.0)
Hemoglobin: 11.6 g/dL — ABNORMAL LOW (ref 13.2–17.1)
Lymphocytes Relative: 39 %
Lymphs Abs: 3861 cells/uL (ref 850–3900)
MCH: 26.8 pg — ABNORMAL LOW (ref 27.0–33.0)
MCHC: 32.4 g/dL (ref 32.0–36.0)
MCV: 82.7 fL (ref 80.0–100.0)
MPV: 9.6 fL (ref 7.5–12.5)
Monocytes Absolute: 396 cells/uL (ref 200–950)
Monocytes Relative: 4 %
Neutro Abs: 5544 cells/uL (ref 1500–7800)
Neutrophils Relative %: 56 %
Platelets: 239 10*3/uL (ref 140–400)
RBC: 4.33 MIL/uL (ref 4.20–5.80)
RDW: 15.7 % — ABNORMAL HIGH (ref 11.0–15.0)
WBC: 9.9 10*3/uL (ref 3.8–10.8)

## 2016-10-04 LAB — LIPID PANEL
Cholesterol: 141 mg/dL (ref <200)
HDL: 59 mg/dL (ref >40)
LDL Cholesterol: 73 mg/dL (ref <100)
Total CHOL/HDL Ratio: 2.4 Ratio (ref <5.0)
Triglycerides: 46 mg/dL (ref <150)
VLDL: 9 mg/dL (ref <30)

## 2016-10-04 LAB — COMPLETE METABOLIC PANEL WITH GFR
ALT: 10 U/L (ref 9–46)
AST: 12 U/L (ref 10–35)
Albumin: 3.4 g/dL — ABNORMAL LOW (ref 3.6–5.1)
Alkaline Phosphatase: 78 U/L (ref 40–115)
BUN: 18 mg/dL (ref 7–25)
CO2: 28 mmol/L (ref 20–31)
Calcium: 9.4 mg/dL (ref 8.6–10.3)
Chloride: 100 mmol/L (ref 98–110)
Creat: 1.21 mg/dL (ref 0.70–1.25)
GFR, Est African American: 70 mL/min (ref >=60)
GFR, Est Non African American: 61 mL/min (ref >=60)
Glucose, Bld: 134 mg/dL — ABNORMAL HIGH (ref 65–99)
Potassium: 3.8 mmol/L (ref 3.5–5.3)
Sodium: 137 mmol/L (ref 135–146)
Total Bilirubin: 0.3 mg/dL (ref 0.2–1.2)
Total Protein: 7 g/dL (ref 6.1–8.1)

## 2016-10-04 LAB — CORTISOL-AM, BLOOD: Cortisol - AM: 25.7 ug/dL — ABNORMAL HIGH

## 2016-10-04 LAB — TSH: TSH: 4 mIU/L (ref 0.40–4.50)

## 2016-10-10 ENCOUNTER — Encounter: Payer: Self-pay | Admitting: *Deleted

## 2016-10-11 ENCOUNTER — Other Ambulatory Visit: Payer: Self-pay | Admitting: Internal Medicine

## 2016-10-20 ENCOUNTER — Ambulatory Visit: Payer: Medicare Other | Admitting: Pharmacotherapy

## 2016-10-27 ENCOUNTER — Encounter: Payer: Self-pay | Admitting: Pharmacotherapy

## 2016-10-27 ENCOUNTER — Ambulatory Visit (INDEPENDENT_AMBULATORY_CARE_PROVIDER_SITE_OTHER): Payer: Medicare Other | Admitting: Pharmacotherapy

## 2016-10-27 VITALS — BP 150/78 | HR 87 | Ht 69.0 in | Wt 312.2 lb

## 2016-10-27 DIAGNOSIS — N182 Chronic kidney disease, stage 2 (mild): Secondary | ICD-10-CM

## 2016-10-27 DIAGNOSIS — E0822 Diabetes mellitus due to underlying condition with diabetic chronic kidney disease: Secondary | ICD-10-CM

## 2016-10-27 DIAGNOSIS — Z794 Long term (current) use of insulin: Secondary | ICD-10-CM

## 2016-10-27 DIAGNOSIS — I10 Essential (primary) hypertension: Secondary | ICD-10-CM | POA: Diagnosis not present

## 2016-10-27 NOTE — Progress Notes (Signed)
  Subjective:    Adrian Matthews is a 71 y.o.African American male who presents for follow-up of Type 2 diabetes mellitus.   He has had extensive treatment after his surgery.  Feels so much better since tumor removed. Most recent A1C: 7.1% (10/03/16).  He has a good appetite. Can return to the gym next week.  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  Current Lantus is is 60 units daily. Current Humalog dose is 22 units breakfast, 26 units lunch & supper. Forgot to bring blood glucose meter. He reports average BG: 140mg /dl Hypoglycemia 3 times per week. Often before lunch.  Denies problems with vision.  Eye exam in August 2017. Denies problems with feet. Some peripheral edema. Nocturia each night. No dysuria Staying well hydrated.  BP usually 120/70 range.  Review of Systems A comprehensive review of systems was negative except for: Cardiovascular: positive for lower extremity edema Genitourinary: positive for nocturia    Objective:    BP (!) 150/78   Pulse 87   Ht 5\' 9"  (1.753 m)   Wt (!) 312 lb 3.2 oz (141.6 kg)   SpO2 98%   BMI 46.10 kg/m   General:  alert, cooperative and no distress  Oropharynx: normal findings: lips normal without lesions and gums healthy   Eyes:  negative findings: lids and lashes normal and conjunctivae and sclerae normal   Ears:  normal TM's and external ear canals both ears and external ears normal        Lung: clear to auscultation bilaterally  Heart:  regular rate and rhythm     Extremities: edema lower extremities  Skin: warm and dry, no hyperpigmentation, vitiligo, or suspicious lesions     Neuro: mental status, speech normal, alert and oriented x3 and gait and station normal   Lab Review Glucose (mg/dL)  Date Value  03/12/2016 67  02/26/2016 113 (H)  10/19/2015 133 (H)   Glucose, Bld (mg/dL)  Date Value  10/03/2016 134 (H)  01/01/2016 166 (H)  06/05/2013 112 (H)   CO2 (mmol/L)  Date Value  10/03/2016 28   03/12/2016 27  02/26/2016 25   BUN (mg/dL)  Date Value  10/03/2016 18  03/12/2016 23  02/26/2016 26  01/01/2016 12  10/19/2015 15   Creat (mg/dL)  Date Value  10/03/2016 1.21   Creatinine, Ser (mg/dL)  Date Value  05/06/2016 1.46 (H)  03/12/2016 1.29 (H)  02/26/2016 1.56 (H)       Assessment:    Diabetes Mellitus type II, under excellent control.   BP above goal <140/90 today, usually OK.   Plan:    1.  Rx changes: Decrease breakfast dose of Humalog to 20 units  2.   Continue Lantus 60 units daily. 3.  Counseled on nutrition goals. 4.  Exercise goal is 30-45 minutes 5 x week. 5.  BP usually at goal, will continue current RX and monitor.

## 2016-10-27 NOTE — Patient Instructions (Signed)
Decrease breakfast Humalog to 20 units

## 2016-11-11 ENCOUNTER — Other Ambulatory Visit: Payer: Self-pay | Admitting: Internal Medicine

## 2016-11-19 DIAGNOSIS — D352 Benign neoplasm of pituitary gland: Secondary | ICD-10-CM | POA: Diagnosis not present

## 2016-11-19 DIAGNOSIS — Z8639 Personal history of other endocrine, nutritional and metabolic disease: Secondary | ICD-10-CM | POA: Diagnosis not present

## 2016-11-19 DIAGNOSIS — E221 Hyperprolactinemia: Secondary | ICD-10-CM | POA: Diagnosis not present

## 2016-11-19 DIAGNOSIS — G9389 Other specified disorders of brain: Secondary | ICD-10-CM | POA: Diagnosis not present

## 2016-11-19 DIAGNOSIS — E291 Testicular hypofunction: Secondary | ICD-10-CM | POA: Diagnosis not present

## 2016-11-19 DIAGNOSIS — Z9889 Other specified postprocedural states: Secondary | ICD-10-CM | POA: Diagnosis not present

## 2016-11-19 DIAGNOSIS — D353 Benign neoplasm of craniopharyngeal duct: Secondary | ICD-10-CM | POA: Diagnosis not present

## 2016-11-21 LAB — TSH: TSH: 3.25 u[IU]/mL (ref <=5.90)

## 2016-11-29 ENCOUNTER — Other Ambulatory Visit: Payer: Self-pay | Admitting: Internal Medicine

## 2016-12-20 ENCOUNTER — Other Ambulatory Visit: Payer: Self-pay | Admitting: Internal Medicine

## 2017-01-01 ENCOUNTER — Ambulatory Visit: Payer: Self-pay | Admitting: Internal Medicine

## 2017-01-01 ENCOUNTER — Telehealth: Payer: Self-pay | Admitting: Internal Medicine

## 2017-01-01 NOTE — Telephone Encounter (Signed)
I called to schedule subsequent AWV that's due in May. Pt doesn't want to make multiple trips so I explained I would schedule it later with another follow up appt. VDM (DD)

## 2017-01-02 ENCOUNTER — Ambulatory Visit (INDEPENDENT_AMBULATORY_CARE_PROVIDER_SITE_OTHER): Payer: Medicare Other | Admitting: Internal Medicine

## 2017-01-02 ENCOUNTER — Encounter: Payer: Self-pay | Admitting: Internal Medicine

## 2017-01-02 VITALS — BP 132/78 | HR 88 | Temp 97.9°F | Ht 69.0 in | Wt 338.0 lb

## 2017-01-02 DIAGNOSIS — I10 Essential (primary) hypertension: Secondary | ICD-10-CM

## 2017-01-02 DIAGNOSIS — Z794 Long term (current) use of insulin: Secondary | ICD-10-CM | POA: Diagnosis not present

## 2017-01-02 DIAGNOSIS — E0822 Diabetes mellitus due to underlying condition with diabetic chronic kidney disease: Secondary | ICD-10-CM

## 2017-01-02 DIAGNOSIS — E348 Other specified endocrine disorders: Secondary | ICD-10-CM

## 2017-01-02 DIAGNOSIS — IMO0002 Reserved for concepts with insufficient information to code with codable children: Secondary | ICD-10-CM

## 2017-01-02 DIAGNOSIS — Z6841 Body Mass Index (BMI) 40.0 and over, adult: Secondary | ICD-10-CM | POA: Diagnosis not present

## 2017-01-02 DIAGNOSIS — IMO0001 Reserved for inherently not codable concepts without codable children: Secondary | ICD-10-CM

## 2017-01-02 DIAGNOSIS — E229 Hyperfunction of pituitary gland, unspecified: Secondary | ICD-10-CM | POA: Diagnosis not present

## 2017-01-02 DIAGNOSIS — N182 Chronic kidney disease, stage 2 (mild): Secondary | ICD-10-CM | POA: Diagnosis not present

## 2017-01-02 DIAGNOSIS — E349 Endocrine disorder, unspecified: Secondary | ICD-10-CM | POA: Insufficient documentation

## 2017-01-02 DIAGNOSIS — E669 Obesity, unspecified: Secondary | ICD-10-CM

## 2017-01-02 DIAGNOSIS — D352 Benign neoplasm of pituitary gland: Secondary | ICD-10-CM

## 2017-01-02 MED ORDER — ASPIRIN EC 81 MG PO TBEC: 81.0000 mg | DELAYED_RELEASE_TABLET | Freq: Every day | ORAL | 11 refills | Status: DC

## 2017-01-02 NOTE — Patient Instructions (Addendum)
Reminder: Bring copy of [place] and/or Living Will to next appointment.

## 2017-01-02 NOTE — Progress Notes (Signed)
Location:  [facility] clinic Provider:  Rossi Burdo L. Mariea Clonts, D.O., C.M.D.  Code Status: full code Goals of Care:  Advanced Directives 01/02/2017  Does Patient Have a Medical Advance Directive? Yes  Type of Advance Directive [place]  Does patient want to make changes to medical advance directive? No - Patient declined  Copy of [place] in Chart? No - copy requested  Would patient like information on creating a medical advance directive? -  Pre-existing out of facility DNR order (yellow form or pink MOST form) -   Chief Complaint  Patient presents with  . Medical Management of Chronic Issues    3 month follow-up, weight gain. Discuss letter from [place] (patient will personally give to Dr.Teniya Filter)    . Medication Refill    No refills needed today     HPI: Patient is a 71 y.o. male seen today for medical management of chronic diseases.    He is back at the gym.  He is so sore.  He is swimming 45 mins and doing 45 mins water aerobics three times a week.  Can hardly move in b/w.  Has been this am.    He wants to eat grapefruits.  Discussed interaction with his medication.    Feels fine.  In his toes, he's getting sharp pains sometimes.  Notices at night or if feet are hanging down and not elevated.   Is trying to eat right again.  Had been gaining weight (up to 338 from 312).    No headaches.  Past Medical History:  Diagnosis Date  . Asymptomatic varicose veins   . Chronic kidney disease   . Diaphragmatic hernia without mention of obstruction or gangrene   . DM (diabetes mellitus) ([place])   . GERD (gastroesophageal reflux disease)   . Gout, unspecified   . Hypercalcemia   . Hyperlipidemia   . Hyperosmolality and/or hypernatremia   . Hypertension   . Monoclonal paraproteinemia   . New daily persistent headache   . Osteoarthrosis, unspecified whether generalized or localized, lower leg   . Other malaise and fatigue   . Primary localized  osteoarthrosis, unspecified site   . Unspecified disorder of kidney and ureter   . Unspecified hereditary and idiopathic peripheral neuropathy     Past Surgical History:  Procedure Laterality Date  . CARPAL TUNNEL RELEASE  2007  . COLONOSCOPY N/A 06/06/2013   Procedure: COLONOSCOPY;  Surgeon: Cleotis Nipper, MD;  [facility] ENDOSCOPY;  Service: Endoscopy;  Laterality: N/A;    No Known Allergies  Allergies as of 01/02/2017   No Known Allergies     Medication List       Accurate as of 01/02/17 10:56 AM. Always use your most recent med list.          amLODipine 10 MG tablet Commonly known as:  NORVASC TAKE 1 TABLET DAILY FOR BLOOD PRESSURE   atorvastatin 40 MG tablet Commonly known as:  LIPITOR TAKE 1 TABLET DAILY   chlorthalidone 25 MG tablet Commonly known as:  HYGROTON TAKE 1 TABLET DAILY FOR BLOOD PRESSURE   FREESTYLE LITE test strip Generic drug:  glucose blood CHECK BLOOD SUGAR THREE TIMES A DAY   FUSION PLUS Caps Take 1 capsule by mouth every other day.   Insulin Glargine 100 UNIT/ML Solostar Pen Commonly known as:  LANTUS SOLOSTAR Inject 60 Units into the skin daily at 10 pm.   insulin lispro 100 UNIT/ML KiwkPen Commonly known as:  HUMALOG KWIKPEN Inject  22 units before breakfast, 26 units before lunch & supper - plus sliding scale.   KLOR-CON M20 20 MEQ tablet Generic drug:  potassium chloride SA TAKE 1 TABLET TWICE A DAY   losartan 100 MG tablet Commonly known as:  COZAAR TAKE 1 TABLET DAILY   NETI POT SINUS [place] NA Place into the nose 2 (two) times daily. X 3 months, started 09/25/16   NEXIUM 40 MG capsule Generic drug:  esomeprazole Take 40 mg by mouth daily at 12 noon.   pantoprazole 40 MG tablet Commonly known as:  PROTONIX Take 1 tablet (40 mg total) by mouth daily.   SURE COMFORT PEN NEEDLES 32G X 4 MM Misc Generic drug:  Insulin Pen Needle USE FOUR TIMES A DAY BEFORE MEALS   Vitamin D3 2000 units Tabs Take by mouth daily.     ZOLMitriptan 2.5 MG tablet Commonly known as:  ZOMIG Take 1 tablet (2.5 mg total) by mouth once. May repeat in 2 hours if headache persists or recurs.      Review of Systems:  Review of Systems  Constitutional: Negative for chills, fever and malaise/fatigue.       Weight gain  HENT: Negative for congestion and hearing loss.   Eyes: Negative for blurred vision.  Respiratory: Negative for shortness of breath.   Cardiovascular: Negative for chest pain, palpitations and leg swelling.  Gastrointestinal: Negative for abdominal pain, blood in stool, constipation, diarrhea and melena.  Genitourinary: Negative for dysuria.  Musculoskeletal: Negative for falls.  Skin: Negative for itching and rash.  Neurological: Negative for dizziness, loss of consciousness and weakness.  Endo/Heme/Allergies: Does not bruise/bleed easily.  Psychiatric/Behavioral: Negative for depression and memory loss.    Health Maintenance  Topic Date Due  . Hepatitis C Screening  March 20, 1946  . FOOT EXAM  03/06/2017  . HEMOGLOBIN A1C  04/03/2017  . OPHTHALMOLOGY EXAM  06/20/2017  . TETANUS/TDAP  04/13/2023  . COLONOSCOPY  06/07/2023  . INFLUENZA VACCINE  Completed  . PNA vac Low Risk Adult  Completed    Physical Exam: Vitals:   01/02/17 1021  BP: 132/78  Pulse: 88  Temp: 97.9 F (36.6 C)  TempSrc: Oral  SpO2: 98%  Weight: (!) 338 lb (153.3 kg)  Height: 5\' 9"  (1.753 m)   Body mass index is 49.91 kg/m. Physical Exam  Constitutional: He is oriented to person, place, and time. He appears well-developed and well-nourished. No distress.  Morbidly obese male  HENT:  Head: Normocephalic and atraumatic.  Cardiovascular: Normal rate, regular rhythm, normal heart sounds and intact distal pulses.   Pulmonary/Chest: Effort normal and breath sounds normal. No respiratory distress.  Musculoskeletal: Normal range of motion.  Neurological: He is alert and oriented to person, place, and time.  Skin: Skin is warm  and dry. Capillary refill takes less than 2 seconds.  Psychiatric: He has a normal mood and affect. His behavior is normal. Judgment and thought content normal.    Labs reviewed: Basic Metabolic Panel:  Recent Labs  02/26/16 1420 03/12/16 0815 05/06/16 1745 10/03/16 0811  NA 138 137  --  137  K 3.7 3.8  --  3.8  CL 95* 94*  --  100  CO2 25 27  --  28  GLUCOSE 113* 67  --  134*  BUN 26 23  --  18  CREATININE 1.56* 1.29* 1.46* 1.21  CALCIUM 9.7 9.8  --  9.4  TSH 2.520  --   --  4.00   Liver Function Tests:  Recent Labs  02/26/16 1420 03/12/16 0815 10/03/16 0811  AST 17 21 12   ALT 15 19 10   ALKPHOS 101 106 78  BILITOT <0.2 <0.2 0.3  PROT 7.4 7.3 7.0  ALBUMIN 4.4 4.0 3.4*   No results for input(s): LIPASE, AMYLASE in the last 8760 hours. No results for input(s): AMMONIA in the last 8760 hours. CBC:  Recent Labs  02/26/16 1420 10/03/16 0811  WBC 12.6* 9.9  NEUTROABS  --  5,544  HGB  --  11.6*  HCT 38.5 35.8*  MCV 81 82.7  PLT 261 239   Lipid Panel:  Recent Labs  10/03/16 0811  CHOL 141  HDL 59  LDLCALC 73  TRIG 46  CHOLHDL 2.4   Lab Results  Component Value Date   HGBA1C 7.1 (H) 10/03/2016   Assessment/Plan 1. Pituitary microadenoma with hyperprolactinemia ([place]) - s/p resection at [facility] -needs f/u hormone levels in august at [place] to be sent to his [facility], [facility], Dept of Neurosurgery when they return--orders placed for future today and pt advised to make appt - Testosterone; Future - Sex hormone binding globulin; Future - Albumin; Future - FSH/LH; Future - Prolactin; Future  2. Diabetes mellitus due to underlying condition, controlled, with stage 2 chronic kidney disease, with long-term current use of insulin ([place]) -cont current regimen -is back to gym and eating less and healthier food again -keep f/u with [facility] as planned with labs before  3. Essential hypertension -bp well controlled on current therapy, cont same regimen  4.  Hormone deficiency - f/u labs b/c pt's pituitary is not stimulating them as expected just yet and neurosurgery advised he should have them rechecked in august - Testosterone; Future - Sex hormone binding globulin; Future - Albumin; Future - FSH/LH; Future - Prolactin; Future  5. Class 3 obesity with serious comorbidity and body mass index (BMI) of 45.0 to 49.9 in adult, unspecified obesity type ([place]) -has gained considerable weight -counseled today about his diet and exercise -was going to try an afternoon exercise class, but has trouble doing anything after 2pm  Labs/tests ordered:   Orders Placed This Encounter  Procedures  . Testosterone    Standing Status:   Future    Standing Expiration Date:   10/12/2017  . Sex hormone binding globulin    Standing Status:   Future    Standing Expiration Date:   10/12/2017  . Albumin    Standing Status:   Future    Standing Expiration Date:   10/12/2017  . FSH/LH    Standing Status:   Future    Standing Expiration Date:   10/12/2017  . Prolactin    Standing Status:   Future    Standing Expiration Date:   10/12/2017    Next appt:  01/22/2017   Eadie Repetto L. Philis Doke, D.O. [facility] [address], [place] [address] Cell Phone (Mon-Fri 8am-5pm):  [phone] On Call:  [phone] & follow prompts after 5pm & weekends Office Phone:  [phone] Office Fax:  [phone]

## 2017-01-22 ENCOUNTER — Other Ambulatory Visit: Payer: Medicare Other

## 2017-01-22 ENCOUNTER — Encounter: Payer: Self-pay | Admitting: Internal Medicine

## 2017-01-22 ENCOUNTER — Other Ambulatory Visit: Payer: Self-pay | Admitting: Internal Medicine

## 2017-01-22 DIAGNOSIS — E348 Other specified endocrine disorders: Secondary | ICD-10-CM | POA: Diagnosis not present

## 2017-01-22 DIAGNOSIS — D352 Benign neoplasm of pituitary gland: Secondary | ICD-10-CM | POA: Diagnosis not present

## 2017-01-22 DIAGNOSIS — IMO0002 Reserved for concepts with insufficient information to code with codable children: Secondary | ICD-10-CM

## 2017-01-22 DIAGNOSIS — E229 Hyperfunction of pituitary gland, unspecified: Secondary | ICD-10-CM | POA: Diagnosis not present

## 2017-01-22 DIAGNOSIS — E349 Endocrine disorder, unspecified: Secondary | ICD-10-CM

## 2017-01-22 LAB — ALBUMIN: Albumin: 3.8 g/dL (ref 3.6–5.1)

## 2017-01-23 LAB — FSH/LH
FSH: 3.8 m[IU]/mL (ref 1.6–8.0)
LH: 1.1 m[IU]/mL — ABNORMAL LOW (ref 1.6–15.2)

## 2017-01-23 LAB — TESTOSTERONE: Testosterone: 37 ng/dL — ABNORMAL LOW (ref 250–827)

## 2017-01-23 LAB — PROLACTIN: Prolactin: 4.4 ng/mL (ref 2.0–18.0)

## 2017-01-23 LAB — SEX HORMONE BINDING GLOBULIN: Sex Hormone Binding: 34 nmol/L (ref 22–77)

## 2017-01-26 ENCOUNTER — Other Ambulatory Visit: Payer: Self-pay

## 2017-01-26 ENCOUNTER — Ambulatory Visit (INDEPENDENT_AMBULATORY_CARE_PROVIDER_SITE_OTHER): Payer: Medicare Other | Admitting: Pharmacotherapy

## 2017-01-26 ENCOUNTER — Encounter: Payer: Self-pay | Admitting: Pharmacotherapy

## 2017-01-26 VITALS — BP 128/74 | HR 76 | Resp 20 | Ht 69.0 in | Wt 342.0 lb

## 2017-01-26 DIAGNOSIS — E0822 Diabetes mellitus due to underlying condition with diabetic chronic kidney disease: Secondary | ICD-10-CM

## 2017-01-26 DIAGNOSIS — Z794 Long term (current) use of insulin: Secondary | ICD-10-CM

## 2017-01-26 DIAGNOSIS — N182 Chronic kidney disease, stage 2 (mild): Secondary | ICD-10-CM

## 2017-01-26 DIAGNOSIS — I10 Essential (primary) hypertension: Secondary | ICD-10-CM | POA: Diagnosis not present

## 2017-01-26 DIAGNOSIS — E349 Endocrine disorder, unspecified: Secondary | ICD-10-CM

## 2017-01-26 DIAGNOSIS — E348 Other specified endocrine disorders: Secondary | ICD-10-CM

## 2017-01-26 DIAGNOSIS — IMO0002 Reserved for concepts with insufficient information to code with codable children: Secondary | ICD-10-CM

## 2017-01-26 DIAGNOSIS — D352 Benign neoplasm of pituitary gland: Secondary | ICD-10-CM

## 2017-01-26 DIAGNOSIS — E229 Hyperfunction of pituitary gland, unspecified: Principal | ICD-10-CM

## 2017-01-26 LAB — MICROALBUMIN / CREATININE URINE RATIO
Creatinine, Urine: 124 mg/dL (ref 20–370)
Microalb Creat Ratio: 6 mcg/mg creat (ref <30)
Microalb, Ur: 0.8 mg/dL

## 2017-01-26 LAB — COMPREHENSIVE METABOLIC PANEL
ALT: 16 U/L (ref 9–46)
AST: 13 U/L (ref 10–35)
Albumin: 3.7 g/dL (ref 3.6–5.1)
Alkaline Phosphatase: 88 U/L (ref 40–115)
BUN: 24 mg/dL (ref 7–25)
CO2: 29 mmol/L (ref 20–31)
Calcium: 10.1 mg/dL (ref 8.6–10.3)
Chloride: 100 mmol/L (ref 98–110)
Creat: 1.12 mg/dL (ref 0.70–1.18)
Glucose, Bld: 141 mg/dL — ABNORMAL HIGH (ref 65–99)
Potassium: 3.6 mmol/L (ref 3.5–5.3)
Sodium: 140 mmol/L (ref 135–146)
Total Bilirubin: 0.3 mg/dL (ref 0.2–1.2)
Total Protein: 7.5 g/dL (ref 6.1–8.1)

## 2017-01-26 NOTE — Patient Instructions (Signed)
Keep up the good work

## 2017-01-26 NOTE — Progress Notes (Signed)
  Subjective:    Adrian Matthews is a 71 y.o.African American male who presents for follow-up of Type 2 diabetes mellitus.   Last A1C was 7.1% Currently on Lantus and Humalog.  He is trying to eat healthy choices - monitoring portion sizes. Exercises 3 times per week - swimming and water aerobics.  Forgot to bring blood glucose meter. He thinks BG is doing better. He reports average BG: 153m/dl No hypoglycemia  Denies problems with vision. Denies problems with feet.  Some swelling in toes. Nocturia 3-4 times per night No dysuria Staying well hydrated.   Review of Systems A comprehensive review of systems was negative except for: Cardiovascular: positive for lower extremity edema Genitourinary: positive for nocturia    Objective:    BP 128/74   Resp 20   Ht 5' 9"$  (1.753 m)   Wt (!) 342 lb (155.1 kg)   BMI 50.50 kg/m   General:  alert, cooperative and no distress  Oropharynx: normal findings: lips normal without lesions and gums healthy   Eyes:  negative findings: lids and lashes normal and conjunctivae and sclerae normal   Ears:  external ears normal        Lung: clear to auscultation bilaterally  Heart:  regular rate and rhythm     Extremities: complains of edema in toes  Skin: warm and dry, no hyperpigmentation, vitiligo, or suspicious lesions     Neuro: mental status, speech normal, alert and oriented x3 and gait and station normal   Lab Review Glucose (mg/dL)  Date Value  03/12/2016 67  02/26/2016 113 (H)  10/19/2015 133 (H)   Glucose, Bld (mg/dL)  Date Value  10/03/2016 134 (H)  01/01/2016 166 (H)  06/05/2013 112 (H)   CO2 (mmol/L)  Date Value  10/03/2016 28  03/12/2016 27  02/26/2016 25   BUN (mg/dL)  Date Value  10/03/2016 18  03/12/2016 23  02/26/2016 26  01/01/2016 12  10/19/2015 15   Creat (mg/dL)  Date Value  10/03/2016 1.21   Creatinine, Ser (mg/dL)  Date Value  05/06/2016 1.46 (H)  03/12/2016 1.29 (H)  02/26/2016 1.56 (H)        Assessment:    Diabetes Mellitus type II, under good control.   Will check A1C today. BP at goal <130/80   Plan:    1.  Rx changes: none  2.  Continue Lantus 60 units once daily 3.  Continue Humalog 22 units breakfast, 26 units lunch & supper. 4.  Counseled on nutrition goals 5.  Praised exercise efforts.   Goal is 30-45 minutes 5 x week. 6.  BP at goal <130/80.

## 2017-02-03 ENCOUNTER — Encounter: Payer: Self-pay | Admitting: Pharmacotherapy

## 2017-02-11 ENCOUNTER — Other Ambulatory Visit: Payer: Self-pay | Admitting: Internal Medicine

## 2017-02-11 DIAGNOSIS — K219 Gastro-esophageal reflux disease without esophagitis: Secondary | ICD-10-CM

## 2017-04-09 ENCOUNTER — Encounter: Payer: Self-pay | Admitting: Internal Medicine

## 2017-04-09 ENCOUNTER — Ambulatory Visit (INDEPENDENT_AMBULATORY_CARE_PROVIDER_SITE_OTHER): Payer: Medicare Other | Admitting: Internal Medicine

## 2017-04-09 ENCOUNTER — Other Ambulatory Visit: Payer: Self-pay | Admitting: Internal Medicine

## 2017-04-09 VITALS — BP 120/74 | HR 74 | Temp 98.0°F | Wt 352.0 lb

## 2017-04-09 DIAGNOSIS — E349 Endocrine disorder, unspecified: Secondary | ICD-10-CM

## 2017-04-09 DIAGNOSIS — Z794 Long term (current) use of insulin: Secondary | ICD-10-CM | POA: Diagnosis not present

## 2017-04-09 DIAGNOSIS — E669 Obesity, unspecified: Secondary | ICD-10-CM | POA: Diagnosis not present

## 2017-04-09 DIAGNOSIS — M1990 Unspecified osteoarthritis, unspecified site: Secondary | ICD-10-CM

## 2017-04-09 DIAGNOSIS — IMO0002 Reserved for concepts with insufficient information to code with codable children: Secondary | ICD-10-CM

## 2017-04-09 DIAGNOSIS — M7631 Iliotibial band syndrome, right leg: Secondary | ICD-10-CM | POA: Diagnosis not present

## 2017-04-09 DIAGNOSIS — E785 Hyperlipidemia, unspecified: Secondary | ICD-10-CM

## 2017-04-09 DIAGNOSIS — E1169 Type 2 diabetes mellitus with other specified complication: Secondary | ICD-10-CM | POA: Diagnosis not present

## 2017-04-09 DIAGNOSIS — Z6841 Body Mass Index (BMI) 40.0 and over, adult: Secondary | ICD-10-CM | POA: Diagnosis not present

## 2017-04-09 DIAGNOSIS — E229 Hyperfunction of pituitary gland, unspecified: Secondary | ICD-10-CM

## 2017-04-09 DIAGNOSIS — E348 Other specified endocrine disorders: Secondary | ICD-10-CM | POA: Diagnosis not present

## 2017-04-09 DIAGNOSIS — I10 Essential (primary) hypertension: Secondary | ICD-10-CM

## 2017-04-09 DIAGNOSIS — N182 Chronic kidney disease, stage 2 (mild): Secondary | ICD-10-CM

## 2017-04-09 DIAGNOSIS — D352 Benign neoplasm of pituitary gland: Secondary | ICD-10-CM | POA: Diagnosis not present

## 2017-04-09 DIAGNOSIS — E119 Type 2 diabetes mellitus without complications: Secondary | ICD-10-CM | POA: Diagnosis not present

## 2017-04-09 DIAGNOSIS — IMO0001 Reserved for inherently not codable concepts without codable children: Secondary | ICD-10-CM

## 2017-04-09 MED ORDER — INSULIN LISPRO 100 UNIT/ML (KWIKPEN): PEN_INJECTOR | SUBCUTANEOUS | 3 refills | Status: DC

## 2017-04-09 NOTE — Patient Instructions (Signed)
Iliotibial Band Syndrome Rehab  Ask your health care provider which exercises are safe for you. Do exercises exactly as told by your health care provider and adjust them as directed. It is normal to feel mild stretching, pulling, tightness, or discomfort as you do these exercises, but you should stop right away if you feel sudden pain or your pain gets worse. Do not begin these exercises until told by your health care provider.  Stretching and range of motion exercises  These exercises warm up your muscles and joints and improve the movement and flexibility of your hip and pelvis.  Exercise A: Quadriceps, prone    1. Lie on your abdomen on a firm surface, such as a bed or padded floor.  2. Bend your left / right knee and hold your ankle. If you cannot reach your ankle or pant leg, loop a belt around your foot and grab the belt instead.  3. Gently pull your heel toward your buttocks. Your knee should not slide out to the side. You should feel a stretch in the front of your thigh and knee.  4. Hold this position for __________ seconds.  Repeat __________ times. Complete this stretch __________ times a day.  Exercise B: Iliotibial band    1. Lie on your side with your left / right leg in the top position.  2. Bend both of your knees and grab your left / right ankle. Stretch out your bottom arm to help you balance.  3. Slowly bring your top knee back so your thigh goes behind your trunk.  4. Slowly lower your top leg toward the floor until you feel a gentle stretch on the outside of your left / right hip and thigh. If you do not feel a stretch and your knee will not fall farther, place the heel of your other foot on top of your knee and pull your knee down toward the floor with your foot.  5. Hold this position for __________ seconds.  Repeat __________ times. Complete this stretch __________ times a day.  Strengthening exercises  These exercises build strength and endurance in your hip and pelvis. Endurance is the  ability to use your muscles for a long time, even after they get tired.  Exercise C: Straight leg raises (  hip abductors)  1. Lie on your side with your left / right leg in the top position. Lie so your head, shoulder, knee, and hip line up. You may bend your bottom knee to help you balance.  2. Roll your hips slightly forward so your hips are stacked directly over each other and your left / right knee is facing forward.  3. Tense the muscles in your outer thigh and lift your top leg 4-6 inches (10-15 cm).  4. Hold this position for __________ seconds.  5. Slowly return to the starting position. Let your muscles relax completely before doing another repetition.  Repeat __________ times. Complete this exercise __________ times a day.  Exercise D: Straight leg raises (  hip extensors)  1. Lie on your abdomen on your bed or a firm surface. You can put a pillow under your hips if that is more comfortable.  2. Bend your left / right knee so your foot is straight up in the air.  3. Squeeze your buttock muscles and lift your left / right thigh off the bed. Do not let your back arch.  4. Tense this muscle as hard as you can without increasing any knee pain.    5. Hold this position for __________ seconds.  6. Slowly lower your leg to the starting position and allow it to relax completely.  Repeat __________ times. Complete this exercise __________ times a day.  Exercise E: Hip hike  1. Stand sideways on a bottom step. Stand on your left / right leg with your other foot unsupported next to the step. You can hold onto the railing or wall if needed for balance.  2. Keep your knees straight and your torso square. Then, lift your left / right hip up toward the ceiling.  3. Slowly let your left / right hip lower toward the floor, past the starting position. Your foot should get closer to the floor. Do not lean or bend your knees.  Repeat __________ times. Complete this exercise __________ times a day.  This information is not  intended to replace advice given to you by your health care provider. Make sure you discuss any questions you have with your health care provider.  Document Released: 09/29/2005 Document Revised: 06/03/2016 Document Reviewed: 08/31/2015  [employer]vier Interactive Patient Education © 2018 [employer]

## 2017-04-09 NOTE — Progress Notes (Signed)
Location:  [facility] clinic Provider:  Rhea Kaelin L. Mariea Clonts, D.O., C.M.D.  Code Status: DNR Goals of Care:  Advanced Directives 01/02/2017  Does Patient Have a Medical Advance Directive? Yes  Type of Advance Directive [place]  Does patient want to make changes to medical advance directive? No - Patient declined  Copy of [place] in Chart? No - copy requested  Would patient like information on creating a medical advance directive? -  Pre-existing out of facility DNR order (yellow form or pink MOST form) -   Chief Complaint  Patient presents with  . Medical Management of Chronic Issues    49mth follow-up    HPI: Patient is a 71 y.o. male seen today for medical management of chronic diseases.    DMII/Obesity/OA/right thigh pain:  Still does not have an overabundance of energy.  Going to the gym 4 days per week--1hr 45 mins.  Eating healthy meals.  Still gaining weight despite this--weight up 10  More lbs.  Side of legs, knees painful when he uses the elliptical.  Hurts from knee to hip along the side.  Rarely uses any pain medication but in the winter once in a blue moon.    S/p removal of pituitary microadenoma:  Is going to see [facility] 7/12, has labs in August for endocrine.  Does not see them until February in [place].  He was not willing to go in August when they requested he be seen so we will be sending them his labs. Had low testosterone last labs.  He's not thrilled about potentially needing more medications.  Cortisol was slightly elevated also.   Hyperlipidemia:  Remains on statin therapy with reasonable lipids.   Lab Results  Component Value Date   CHOL 141 10/03/2016   HDL 59 10/03/2016   LDLCALC 73 10/03/2016   TRIG 46 10/03/2016   CHOLHDL 2.4 10/03/2016   CKD: avoids nsaids. Monitoring.  Is hydrating well by report.  Past Medical History:  Diagnosis Date  . Asymptomatic varicose veins   . Chronic kidney disease   . Diaphragmatic  hernia without mention of obstruction or gangrene   . DM (diabetes mellitus) ([place])   . GERD (gastroesophageal reflux disease)   . Gout, unspecified   . Hypercalcemia   . Hyperlipidemia   . Hyperosmolality and/or hypernatremia   . Hypertension   . Monoclonal paraproteinemia   . New daily persistent headache   . Osteoarthrosis, unspecified whether generalized or localized, lower leg   . Other malaise and fatigue   . Primary localized osteoarthrosis, unspecified site   . Unspecified disorder of kidney and ureter   . Unspecified hereditary and idiopathic peripheral neuropathy     Past Surgical History:  Procedure Laterality Date  . CARPAL TUNNEL RELEASE  2007  . COLONOSCOPY N/A 06/06/2013   Procedure: COLONOSCOPY;  Surgeon: Cleotis Nipper, MD;  Location: [facility] ENDOSCOPY;  Service: Endoscopy;  Laterality: N/A;    No Known Allergies  Allergies as of 04/09/2017   No Known Allergies     Medication List       Accurate as of 04/09/17  8:29 AM. Always use your most recent med list.          amLODipine 10 MG tablet Commonly known as:  NORVASC TAKE 1 TABLET DAILY FOR BLOOD PRESSURE   aspirin EC 81 MG tablet Take 1 tablet (81 mg total) by mouth daily.   atorvastatin 40 MG tablet Commonly known as:  LIPITOR TAKE 1  TABLET DAILY   chlorthalidone 25 MG tablet Commonly known as:  HYGROTON TAKE 1 TABLET DAILY FOR BLOOD PRESSURE   FREESTYLE LITE test strip Generic drug:  glucose blood CHECK BLOOD SUGAR THREE TIMES A DAY   FUSION PLUS Caps Take 1 capsule by mouth every other day.   Insulin Glargine 100 UNIT/ML Solostar Pen Commonly known as:  LANTUS SOLOSTAR Inject 60 Units into the skin daily at 10 pm.   insulin lispro 100 UNIT/ML KiwkPen Commonly known as:  HUMALOG KWIKPEN Inject 22 units before breakfast, 26 units before lunch & supper - plus sliding scale.   KLOR-CON M20 20 MEQ tablet Generic drug:  potassium chloride SA TAKE 1 TABLET TWICE A DAY   losartan 100 MG  tablet Commonly known as:  COZAAR TAKE 1 TABLET DAILY   pantoprazole 40 MG tablet Commonly known as:  PROTONIX TAKE 1 TABLET DAILY   SURE COMFORT PEN NEEDLES 32G X 4 MM Misc Generic drug:  Insulin Pen Needle USE FOUR TIMES A DAY BEFORE MEALS   Vitamin D3 2000 units Tabs Take by mouth daily.   ZOLMitriptan 2.5 MG tablet Commonly known as:  ZOMIG Take 1 tablet (2.5 mg total) by mouth once. May repeat in 2 hours if headache persists or recurs.       Review of Systems:  Review of Systems  Constitutional: Positive for malaise/fatigue. Negative for chills, diaphoresis, fever and weight loss.       Weight gain  HENT: Negative for congestion and hearing loss.   Eyes: Negative for blurred vision.  Respiratory: Negative for cough and shortness of breath.   Cardiovascular: Positive for leg swelling. Negative for chest pain and palpitations.  Gastrointestinal: Negative for abdominal pain, blood in stool, constipation, diarrhea and melena.  Genitourinary: Negative for dysuria.  Musculoskeletal: Positive for joint pain. Negative for falls and myalgias.       Knees  Skin: Negative for itching and rash.  Neurological: Negative for dizziness, loss of consciousness, weakness and headaches.  Endo/Heme/Allergies: Does not bruise/bleed easily.  Psychiatric/Behavioral: Negative for depression and memory loss. The patient is not nervous/anxious and does not have insomnia.     Health Maintenance  Topic Date Due  . Hepatitis C Screening  June 19, 1946  . FOOT EXAM  03/06/2017  . HEMOGLOBIN A1C  04/03/2017  . INFLUENZA VACCINE  05/13/2017  . OPHTHALMOLOGY EXAM  06/20/2017  . TETANUS/TDAP  04/13/2023  . COLONOSCOPY  06/07/2023  . PNA vac Low Risk Adult  Completed    Physical Exam: Vitals:   04/09/17 0818  BP: 120/74  Pulse: 74  Temp: 98 F (36.7 C)  TempSrc: Oral  SpO2: 98%  Weight: (!) 352 lb (159.7 kg)   Body mass index is 51.98 kg/m. Physical Exam  Constitutional: He is  oriented to person, place, and time. He appears well-developed and well-nourished. No distress.  Obese male  HENT:  Head: Normocephalic and atraumatic.  Cardiovascular: Normal rate, regular rhythm, normal heart sounds and intact distal pulses.   Mild nonpitting edema of feet and ankles  Pulmonary/Chest: Effort normal and breath sounds normal. No respiratory distress.  Abdominal: Soft. Bowel sounds are normal. He exhibits no distension. There is no tenderness.  Musculoskeletal: Normal range of motion.  Neurological: He is alert and oriented to person, place, and time.  Skin: Skin is warm and dry. Capillary refill takes less than 2 seconds.  Psychiatric: He has a normal mood and affect. His behavior is normal. Judgment and thought content normal.  Labs reviewed: Basic Metabolic Panel:  Recent Labs  05/06/16 1745 10/03/16 0811 11/21/16 01/22/17 0840  NA  --  137  --  140  K  --  3.8  --  3.6  CL  --  100  --  100  CO2  --  28  --  29  GLUCOSE  --  134*  --  141*  BUN  --  18  --  24  CREATININE 1.46* 1.21  --  1.12  CALCIUM  --  9.4  --  10.1  TSH  --  4.00 3.25  --    Liver Function Tests:  Recent Labs  10/03/16 0811 01/22/17 0840  AST 12 13  ALT 10 16  ALKPHOS 78 88  BILITOT 0.3 0.3  PROT 7.0 7.5  ALBUMIN 3.4* 3.7  3.8   No results for input(s): LIPASE, AMYLASE in the last 8760 hours. No results for input(s): AMMONIA in the last 8760 hours. CBC:  Recent Labs  10/03/16 0811  WBC 9.9  NEUTROABS 5,544  HGB 11.6*  HCT 35.8*  MCV 82.7  PLT 239   Lipid Panel:  Recent Labs  10/03/16 0811  CHOL 141  HDL 59  LDLCALC 73  TRIG 46  CHOLHDL 2.4   Lab Results  Component Value Date   HGBA1C 7.1 (H) 10/03/2016    Assessment/Plan 1. Type 2 diabetes mellitus without complication, with long-term current use of insulin (HCC) - control was satisfactory last check and has hba1c coming up for [place] - cont cozaar, lantus 60 units at hs, lipitor, baby asa and  humalog - insulin lispro (HUMALOG KWIKPEN) 100 UNIT/ML KiwkPen; Inject 22 units before breakfast, 26 units before lunch & supper - plus sliding scale.  Dispense: 30 pen; Refill: 3  2. Essential hypertension -bp at goal with arb, ccb--cont same regimen  3. Pituitary microadenoma with hyperprolactinemia ([place]) -s/p excision, has had low testosterone and slightly elevated cortisol in April--next labs in august and wants all done together and sent to [facility] physician--not going until next year to see them though -is gaining weight and does not seem to be due to his dietary selections or lack of exercise b/c he's back to the gym and eating right   4. Chronic kidney disease (CKD), stage II (mild) -has been stable, monitor Avoid nephrotoxic agents like nsaids, dose adjust renally excreted meds, hydrate.  5. Class 3 obesity with serious comorbidity and body mass index (BMI) of 50.0 to 59.9 in adult, unspecified obesity type ([place]) -weight trended up, is working hard at this with diet and exercise, is morbidly obese with comorbidities -needs to lose weight -likely would qualify for a bariatric surgery, but doubt he'd go for this -we need to measure his height again next time b/c records show he shrunk two inches from Jun of last year to January??? So bmi looks worse from this also, but weight has trended from 294 in May of last year to 353 now!  6. Arthritis -of knees especially, limits options for exercise program (reports trying a kettlebell class and some classes that require lying on the floor and he could not get back up due to his knees) -cont tylenol and topicals as needed for pain here, but rarely uses them  7. Hormone deficiency -suspect he's still got some hormone imbalances from the pituitary tumor excision -labs coming up  8. Hyperlipidemia associated with type 2 diabetes mellitus ([place]) -last LDL was 73, continuing to work on diet and exercise, ideally  9. Iliotibial band syndrome of  right side -suspect this is the cause the pain he gets down his right thigh when exercising -given a series of stretches to do to loosen this up   Labs/tests ordered:  No orders of the defined types were placed in this encounter. labs already ordered for august  Next appt:  04/23/2017   Nyron Mozer L. Ketrina Boateng, D.O. [facility] [address], [place] [address] Cell Phone (Mon-Fri 8am-5pm):  [phone] On Call:  [phone] & follow prompts after 5pm & weekends Office Phone:  [phone] Office Fax:  [phone]

## 2017-04-23 ENCOUNTER — Other Ambulatory Visit: Payer: Medicare Other

## 2017-04-23 DIAGNOSIS — N182 Chronic kidney disease, stage 2 (mild): Principal | ICD-10-CM

## 2017-04-23 DIAGNOSIS — E348 Other specified endocrine disorders: Secondary | ICD-10-CM | POA: Diagnosis not present

## 2017-04-23 DIAGNOSIS — E229 Hyperfunction of pituitary gland, unspecified: Secondary | ICD-10-CM | POA: Diagnosis not present

## 2017-04-23 DIAGNOSIS — E0822 Diabetes mellitus due to underlying condition with diabetic chronic kidney disease: Secondary | ICD-10-CM

## 2017-04-23 DIAGNOSIS — D352 Benign neoplasm of pituitary gland: Secondary | ICD-10-CM | POA: Diagnosis not present

## 2017-04-23 DIAGNOSIS — IMO0002 Reserved for concepts with insufficient information to code with codable children: Secondary | ICD-10-CM

## 2017-04-23 DIAGNOSIS — Z794 Long term (current) use of insulin: Principal | ICD-10-CM

## 2017-04-23 LAB — COMPLETE METABOLIC PANEL WITH GFR
ALT: 15 U/L (ref 9–46)
AST: 15 U/L (ref 10–35)
Albumin: 3.7 g/dL (ref 3.6–5.1)
Alkaline Phosphatase: 78 U/L (ref 40–115)
BUN: 25 mg/dL (ref 7–25)
CO2: 27 mmol/L (ref 20–31)
Calcium: 9.6 mg/dL (ref 8.6–10.3)
Chloride: 100 mmol/L (ref 98–110)
Creat: 1.26 mg/dL — ABNORMAL HIGH (ref 0.70–1.18)
GFR, Est African American: 66 mL/min (ref >=60)
GFR, Est Non African American: 57 mL/min — ABNORMAL LOW (ref >=60)
Glucose, Bld: 115 mg/dL — ABNORMAL HIGH (ref 65–99)
Potassium: 3.3 mmol/L — ABNORMAL LOW (ref 3.5–5.3)
Sodium: 138 mmol/L (ref 135–146)
Total Bilirubin: 0.3 mg/dL (ref 0.2–1.2)
Total Protein: 7 g/dL (ref 6.1–8.1)

## 2017-04-23 LAB — ALBUMIN: Albumin: 3.7 g/dL (ref 3.6–5.1)

## 2017-04-24 LAB — HEMOGLOBIN A1C
Hgb A1c MFr Bld: 6.7 % — ABNORMAL HIGH (ref <5.7)
Mean Plasma Glucose: 146 mg/dL

## 2017-04-24 LAB — TESTOSTERONE: Testosterone: 37 ng/dL — ABNORMAL LOW (ref 250–827)

## 2017-04-24 LAB — FSH/LH
FSH: 2.4 m[IU]/mL (ref 1.6–8.0)
LH: 1 m[IU]/mL — ABNORMAL LOW (ref 1.6–15.2)

## 2017-04-24 LAB — PROLACTIN: Prolactin: 4.8 ng/mL (ref 2.0–18.0)

## 2017-04-24 LAB — SEX HORMONE BINDING GLOBULIN: Sex Hormone Binding: 39 nmol/L (ref 22–77)

## 2017-04-27 ENCOUNTER — Ambulatory Visit: Payer: Medicare Other | Admitting: Pharmacotherapy

## 2017-05-04 ENCOUNTER — Encounter: Payer: Self-pay | Admitting: Pharmacotherapy

## 2017-05-04 ENCOUNTER — Ambulatory Visit (INDEPENDENT_AMBULATORY_CARE_PROVIDER_SITE_OTHER): Payer: Medicare Other | Admitting: Pharmacotherapy

## 2017-05-04 VITALS — BP 144/82 | HR 84 | Temp 98.4°F | Resp 12 | Ht 69.0 in | Wt 354.0 lb

## 2017-05-04 DIAGNOSIS — Z794 Long term (current) use of insulin: Secondary | ICD-10-CM

## 2017-05-04 DIAGNOSIS — I10 Essential (primary) hypertension: Secondary | ICD-10-CM

## 2017-05-04 DIAGNOSIS — E119 Type 2 diabetes mellitus without complications: Secondary | ICD-10-CM

## 2017-05-04 NOTE — Patient Instructions (Signed)
Keep up the good work

## 2017-05-04 NOTE — Progress Notes (Signed)
  Subjective:    Adrian Matthews is a 71 y.o.African American male who presents for follow-up of Type 2 diabetes mellitus.   A1C: 6.7% Forgot to bring blood glucose meter. Says average in the 120's No hypoglycemia. Going to the gym several times per week. He is trying to eat healthy choices. He is waiting to hear back from [facility] - 2 of his blood tests were low at evaluation. No problems with sleep.  Denies problems with vision Denies problems with feet Does have peripheral edema. Nocturia 4 times per night No dysuria. Staying well hydrated.  Has not taken BP medications this morning. If he doesn't eat, he doesn't take Humalog Lantus 60 units daily Humalog 22 units breakfast, 26 units lunch & supper.     Objective:    BP (!) 144/82   Pulse 84   Temp 98.4 F (36.9 C) (Oral)   Resp 12   Ht 5\' 9"  (1.753 m)   Wt (!) 354 lb (160.6 kg)   SpO2 97%   BMI 52.28 kg/m   General:  alert, cooperative, no distress and morbidly obese  Oropharynx: normal findings: lips normal without lesions and gums healthy   Eyes:  negative findings: lids and lashes normal and conjunctivae and sclerae normal   Ears:  external ears normal        Lung: clear to auscultation bilaterally  Heart:  regular rate and rhythm     Extremities: edema bilateral lower extremities  Skin: warm and dry, no hyperpigmentation, vitiligo, or suspicious lesions     Neuro: mental status, speech normal, alert and oriented x3 and gait and station normal   Lab Review Glucose, Bld (mg/dL)  Date Value  04/23/2017 115 (H)  01/22/2017 141 (H)  10/03/2016 134 (H)   CO2 (mmol/L)  Date Value  04/23/2017 27  01/22/2017 29  10/03/2016 28   BUN (mg/dL)  Date Value  04/23/2017 25  01/22/2017 24  10/03/2016 18  03/12/2016 23  02/26/2016 26  10/19/2015 15   Creat (mg/dL)  Date Value  04/23/2017 1.26 (H)  01/22/2017 1.12  10/03/2016 1.21       Assessment:    Diabetes Mellitus type II, under excellent  control.  A1C at goal <7% BP above goal <130/80 due to not taking medication this morning.   Plan:    1.  Rx changes: none  2.  Continue Lantus 60 units once daily 3.  Continue Humalog 22 units breakfast, 26 units lunch & supper 4.  BP above target <130/80 due to missed RX.  Will continue amlodipine,  Chlorthalidone, losartan. 5.  Potassium a little low at 3.3.  He is taking potassium supplement.  Will monitor. 6.  Counseled on nutrition goals. 7.  Exercise goal is 30-45 minutes 5 x week.

## 2017-05-10 ENCOUNTER — Other Ambulatory Visit: Payer: Self-pay | Admitting: Internal Medicine

## 2017-05-12 ENCOUNTER — Ambulatory Visit (INDEPENDENT_AMBULATORY_CARE_PROVIDER_SITE_OTHER): Payer: Medicare Other | Admitting: Neurology

## 2017-05-12 ENCOUNTER — Encounter: Payer: Self-pay | Admitting: Neurology

## 2017-05-12 VITALS — BP 160/81 | HR 85 | Ht 71.0 in | Wt 356.0 lb

## 2017-05-12 DIAGNOSIS — M5481 Occipital neuralgia: Secondary | ICD-10-CM

## 2017-05-12 DIAGNOSIS — R51 Headache: Secondary | ICD-10-CM

## 2017-05-12 DIAGNOSIS — R519 Headache, unspecified: Secondary | ICD-10-CM

## 2017-05-12 NOTE — Progress Notes (Signed)
[facility]    Provider:  Dr Jaynee Eagles Referring Provider: [name], DO Primary Care Physician:  [name], DO  CC: headaches  Interval history: Throbbing on the left side, started last Friday hasn't had it since last being seen in 02/2016. Nerve blocks got rid of the headache in the past will retry today. Piercing pain very localized to the left occipitoparietal, brief, sharp and paroxysmal. Is following with Dr. Opal Sidles at [facility] and already had repeat of the brain this year. 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   Interval history: Patient returns today for follow up of occipital neuralgia after nerve block 8 weeks ago. Labs were normal esr,crp and mri of the brain showed pituitary enlargement and he is scheduled for follow up with endocrinology. His appointment with endocrinology is in July. 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 His left head pain is better, more tender to the touch as opposed to painful like it was before. The nerve blocks helped, it eased off for quite a while, no more throbbing, no more headache just sensitive. Offered to repeat the blocks but he declined, he says he feels great. He has no energy, more fatigue than anything else. He had a sinue infection and Dr. Ernesto Rutherford at ENT treated it and no more dry mouth in the morning or needing water at night, not dozing off a lot during the day, no snoring, no indication for sleep eval. Reviwed findings below with patient.    This MRI of the brain with and without contrast 02/21/2016 shows the following: 1.   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 Dedicated pituitary imaging with MRI and endocrine labs are recommended to better assess for the possibility of a microadenoma. 2.  There is mild age appropriate cortical atrophy and minimal age-appropriate chronic microvascular ischemic  change. 3.  There are no acute findings.  HPI: Adrian Matthews is a 71 y.o. male here as a referral from Dr. Mariea Clonts for headaches.PMHx HTN, HLD, morbid obesity, CKD, New daily persistent headache, monoclonal gammopathy, fatigue. Just started 2 months agi. Never had headaches before. The headaches are continuous. No inciting events, no head trauma, new medications. He first noticed the headaches in early march and he thought they were sinus related, he is having a lot of sinus problems, He just noticed it one day nothing happened or any special inciting events. They got worse and he sent to the ED at the end of March, he was in tears. The end of April headaches got better. He has pain in the back on the left side of the head. It is throbbing (points to the left occipital area), hurts to lay on, not sensitivie to touch. If he lays his head back in the wrong way it hurts but denies any neck pain. It is throbbing right now. Very localized to the back part of the head. No vision changes, no jaw pain, no fevers or systemic signs, no neck pain. He does not know if her snores but he wakes up with dry mouth, he has 5 bottles of water on the side of his bed, he is not tired during the day, he goes to gym at 5am and he takes a nap and then go to bed at 9pm but he wakes up every morning with headaches. He wakes up coughing at night. Mostly in the morning but his mouth can get  dry otherwise. He wakes with the headaches every morning. He is morbidly obese. No photophobia, nausea.   Reviewed notes, labs and imaging from outside physicians, which showed:  CT of the head 01/01/2016 (personally reviewed images, also reviewed with patient, and agree with the following): FINDINGS: The ventricles are mildly prominent in a generalized manner, a stable finding. The sulci appear within normal limits. There is no intracranial mass, hemorrhage, extra-axial fluid collection, or midline shift. There is rather minimal small  vessel disease in the centra semiovale bilaterally. Elsewhere, gray-white compartments appear normal. No acute infarct evident. The bony calvarium appears intact. The mastoid air cells are clear. There is opacification of much of the right sphenoid sinus region. No intraorbital lesions are evident.  IMPRESSION: Stable mild generalized ventricular prominence. The sulci appear normal. The significance of this mild ventricular prominence is uncertain. Question any clinical findings suggesting potential normal pressure hydrocephalus.  There is minimal periventricular small vessel disease. No acute infarct evident. No hemorrhage or mass effect.  There is right sphenoid sinus opacification.  BMP with creatinins 1.19, hgba1c 6.7 Review of Systems: Patient complains of symptoms per HPI as well as the following symptoms: no CP, no SOB, neck pain and headache. Pertinent negatives and positives per HPI. All others negative.   Social History   Social History  . Marital status: Single    Spouse name: N/A  . Number of children: 0  . Years of education: 40   Occupational History  . Retired     Social History Main Topics  . Smoking status: Never Smoker  . Smokeless tobacco: Never Used  . Alcohol use No  . Drug use: No  . Sexual activity: Not on file   Other Topics Concern  . Not on file   Social History Narrative   Lives alone.   Caffeine use: Coffee (1 cup/morning)   Soda (rare)    Family History  Problem Relation Age of Onset  . Hypertension Sister   . Hypertension Brother   . Diabetes Brother   . Hypertension Brother   . Hypertension Sister   . Migraines Neg Hx     Past Medical History:  Diagnosis Date  . Asymptomatic varicose veins   . Chronic kidney disease   . Diaphragmatic hernia without mention of obstruction or gangrene   . DM (diabetes mellitus) ([place])   . GERD (gastroesophageal reflux disease)   . Gout, unspecified   . Hypercalcemia   .  Hyperlipidemia   . Hyperosmolality and/or hypernatremia   . Hypertension   . Monoclonal paraproteinemia   . New daily persistent headache   . Osteoarthrosis, unspecified whether generalized or localized, lower leg   . Other malaise and fatigue   . Primary localized osteoarthrosis, unspecified site   . Unspecified disorder of kidney and ureter   . Unspecified hereditary and idiopathic peripheral neuropathy     Past Surgical History:  Procedure Laterality Date  . CARPAL TUNNEL RELEASE  2007  . COLONOSCOPY N/A 06/06/2013   Procedure: COLONOSCOPY;  Surgeon: Cleotis Nipper, MD;  Location: [facility] ENDOSCOPY;  Service: Endoscopy;  Laterality: N/A;  . TUMOR REMOVAL  2017   Pituitary adenoma    Current Outpatient Prescriptions  Medication Sig Dispense Refill  . amLODipine (NORVASC) 10 MG tablet TAKE 1 TABLET DAILY FOR BLOOD PRESSURE 90 tablet 3  . aspirin EC 81 MG tablet Take 1 tablet (81 mg total) by mouth daily. 30 tablet 11  . atorvastatin (LIPITOR) 40 MG tablet TAKE 1 TABLET  DAILY 90 tablet 1  . chlorthalidone (HYGROTON) 25 MG tablet TAKE 1 TABLET DAILY FOR BLOOD PRESSURE 90 tablet 1  . Cholecalciferol (VITAMIN D3) 2000 units TABS Take by mouth daily.    Marland Kitchen FREESTYLE LITE test strip CHECK BLOOD SUGAR THREE TIMES A DAY 300 each 1  . Insulin Glargine (LANTUS SOLOSTAR) 100 UNIT/ML Solostar Pen Inject 60 Units into the skin daily at 10 pm. 20 pen 6  . insulin lispro (HUMALOG KWIKPEN) 100 UNIT/ML KiwkPen Inject 22 units before breakfast, 26 units before lunch & supper - plus sliding scale. 30 pen 3  . Iron-FA-B Cmp-C-Biot-Probiotic (FUSION PLUS) CAPS Take 1 capsule by mouth every other day.    Marland Kitchen KLOR-CON M20 20 MEQ tablet TAKE 1 TABLET TWICE A DAY 180 tablet 3  . losartan (COZAAR) 100 MG tablet TAKE 1 TABLET DAILY 90 tablet 1  . pantoprazole (PROTONIX) 40 MG tablet TAKE 1 TABLET DAILY 90 tablet 3  . SURE COMFORT PEN NEEDLES 32G X 4 MM MISC USE FOUR TIMES A DAY BEFORE MEALS 500 each 1  .  ZOLMitriptan (ZOMIG) 2.5 MG tablet Take 1 tablet (2.5 mg total) by mouth once. May repeat in 2 hours if headache persists or recurs. 9 tablet 3   No current facility-administered medications for this visit.     Allergies as of 05/12/2017  . (No Known Allergies)    Vitals: BP (!) 160/81   Pulse 85   Ht 5' 11" (1.803 m)   Wt (!) 356 lb (161.5 kg)   BMI 49.65 kg/m  Last Weight:  Wt Readings from Last 1 Encounters:  05/12/17 (!) 356 lb (161.5 kg)   Last Height:   Ht Readings from Last 1 Encounters:  05/12/17 5' 11" (1.803 m)   Physical exam: Exam: Gen: NAD, conversant, well nourised, obese, well groomed                     CV: RRR, no MRG. No Carotid Bruits. No peripheral edema, warm, nontender Eyes: Conjunctivae clear without exudates or hemorrhage  Neuro: Detailed Neurologic Exam  Speech:    Speech is normal; fluent and spontaneous with normal comprehension.  Cognition:    The patient is oriented to person, place, and time;     recent and remote memory intact;     language fluent;     normal attention, concentration,     fund of knowledge Cranial Nerves:    The pupils are equal, round, and reactive to light. Visual fields are full to finger confrontation. Extraocular movements are intact. Trigeminal sensation is intact and the muscles of mastication are normal. The face is symmetric. The palate elevates in the midline. Hearing intact. Voice is normal. Shoulder shrug is normal. The tongue has normal motion without fasciculations.   Coordination:    Normal finger to nose and heel to shin. Normal rapid alternating movements.   Gait:    Wide based due to large body habitus  Motor Observation:    No asymmetry, no atrophy, and no involuntary movements noted. Tone:    Normal muscle tone.    Posture:    Posture is normal. normal erect    Strength:    Strength is V/V in the upper and lower limbs.      Sensation: intact to LT     Reflex Exam:  DTR's:    Hypo LE  reflexes, Deep tendon reflexes in the upper extremities are normal bilaterally.   Toes:    The toes  are downgoing bilaterally.   Clonus:    Clonus is absent.     Assessment/Plan: 71 y.o. male here as a referral from Dr. Mariea Clonts for headaches.PMHx HTN, HLD, morbid obesity, CKD, New daily persistent headache, monoclonal gammopathy, fatigue. New onset headaches localized to the left occipital area. Pain is localized to the area of the occipital nerve, He had good response to occipital nerve blocks several months ago, will perform a nerve block again today.  NERVE BLOCK PROCEDURE NOTE  Procedure: Patient was consented for left-sided occipital  nerve blocks. A solution the below ingredients  was prepared in a 3-CC syringes with 30 gauge 1/2 inch needle.   Performed by Dr. Jaynee Eagles M.D.  All procedures a documented were medically necessary, reasonable and appropriate based on the patient's history, medical diagnosis and physician opinion. Verbal informed consent was obtained from the patient, patient was informed of potential risk of procedure, including bruising, bleeding, hematoma formation, infection, muscle weakness, muscle pain, numbness, transient hypertension, transient hyperglycemia and transient insomnia among others. All areas injected were topically clean with isopropyl rubbing alcohol. Nonsterile nonlatex gloves were worn during the procedure.  1. Greater occipital nerve block [phone]). The greater occipital nerve site was identified at the nuchal line medial to the occipital artery. Medication was injected into the left and right occipital nerve areas and suboccipital areas. Patient's condition is associated with inflammation of the greater occipital nerve and associated multiple groups. Injection was deemed medically necessary, reasonable and appropriate. Injection represents a separate and unique surgical service.  2. Lesser occipital nerve block [phone]). The lesser occipital nerve site was  identified approximately 2 cm lateral to the greater occipital nerve. Occasion was injected into the left occipital nerve areas. Patient's condition is associated with inflammation of the lesser occipital nerve and associated muscle groups. Injection was deemed medically necessary, reasonable and appropriate. Injection represents a separate and unique surgical service.   Sarina Ill, MD  [facility] [address] [place], [place] [address]  Phone [phone] Fax [phone]  A total of 20 minutes was spent face-to-face with this patient. Over half this time was spent on counseling patient on the occipital neuralgia diagnosis and different diagnostic and therapeutic options available. This does not include time spent performing nerve blocks.

## 2017-05-12 NOTE — Progress Notes (Signed)
Nerve block:  Xylocaine 2% (20 mg/ml) NDC 34287-681-15 Lot [id number] Exp 02/22  Bupivacaine 0.5% (5mg /ml) NDC 59741-638-45 BATCH: [id number] Exp: 12/2018  Depo-medrol 80 mg/ml NDC 2248-2500-37 Lot [id number] Exp 03/2018

## 2017-05-13 ENCOUNTER — Other Ambulatory Visit: Payer: Medicare Other

## 2017-05-13 ENCOUNTER — Telehealth: Payer: Self-pay | Admitting: Neurology

## 2017-05-13 ENCOUNTER — Other Ambulatory Visit: Payer: Self-pay | Admitting: Neurology

## 2017-05-13 NOTE — Telephone Encounter (Signed)
Pt called the office said the nerve block from yesterday did not help with migraines. Please call

## 2017-05-13 NOTE — Telephone Encounter (Signed)
We can repeat nerve blocks otherwise he shoukd continue the neurontin and proceed with mri cervical spine and PT

## 2017-05-14 ENCOUNTER — Other Ambulatory Visit: Payer: Self-pay | Admitting: Neurology

## 2017-05-14 MED ORDER — GABAPENTIN 300 MG PO CAPS: 300.0000 mg | ORAL_CAPSULE | Freq: Three times a day (TID) | ORAL | 11 refills | Status: DC

## 2017-05-14 NOTE — Telephone Encounter (Signed)
Ordered. fyi thanks

## 2017-05-14 NOTE — Telephone Encounter (Signed)
Returned pt's TC. He reports that he had increased pain yesterday but took tramadol 50 mg and then repeated dose 5 hrs later and again this morning. Says that HA has now resolved. He would like to try a preventive-type medication. Says that he's not been on gabapentin in the past. Please send to [facility].

## 2017-05-18 MED ORDER — GABAPENTIN 300 MG PO CAPS: 300.0000 mg | ORAL_CAPSULE | Freq: Three times a day (TID) | ORAL | 11 refills | Status: DC

## 2017-05-18 NOTE — Addendum Note (Signed)
Addended by: Monte Fantasia on: 05/18/2017 01:45 PM   Modules accepted: Orders

## 2017-05-18 NOTE — Telephone Encounter (Signed)
Rx resent to local pharmacy

## 2017-05-18 NOTE — Telephone Encounter (Signed)
Pt said RX was sent to [facility] instead of [facility]. Pt wants 30 day supply sent to local pharmacy.

## 2017-05-28 ENCOUNTER — Other Ambulatory Visit: Payer: Self-pay | Admitting: Internal Medicine

## 2017-06-04 ENCOUNTER — Other Ambulatory Visit: Payer: Self-pay | Admitting: Internal Medicine

## 2017-06-04 DIAGNOSIS — E876 Hypokalemia: Secondary | ICD-10-CM

## 2017-06-18 ENCOUNTER — Other Ambulatory Visit: Payer: Self-pay | Admitting: Internal Medicine

## 2017-06-24 ENCOUNTER — Other Ambulatory Visit: Payer: Self-pay | Admitting: Pharmacotherapy

## 2017-06-24 DIAGNOSIS — Z794 Long term (current) use of insulin: Principal | ICD-10-CM

## 2017-06-24 DIAGNOSIS — E0822 Diabetes mellitus due to underlying condition with diabetic chronic kidney disease: Secondary | ICD-10-CM

## 2017-06-24 DIAGNOSIS — N182 Chronic kidney disease, stage 2 (mild): Principal | ICD-10-CM

## 2017-06-30 DIAGNOSIS — J301 Allergic rhinitis due to pollen: Secondary | ICD-10-CM | POA: Diagnosis not present

## 2017-06-30 DIAGNOSIS — J3081 Allergic rhinitis due to animal (cat) (dog) hair and dander: Secondary | ICD-10-CM | POA: Diagnosis not present

## 2017-07-02 DIAGNOSIS — E119 Type 2 diabetes mellitus without complications: Secondary | ICD-10-CM | POA: Diagnosis not present

## 2017-07-02 LAB — HM DIABETES EYE EXAM

## 2017-07-06 ENCOUNTER — Telehealth: Payer: Self-pay | Admitting: *Deleted

## 2017-07-06 NOTE — Telephone Encounter (Signed)
Spoke with patient and he's having pain with the varicose veins in his legs and now having little red puss bumps showing up and need a referral to vein and vascular. appt scheudled 11:30 07/09/17.

## 2017-07-06 NOTE — Telephone Encounter (Signed)
Patient called wanting to speak with [facility]. Wants Karena Addison to return his call at [phone]. Forwarded.

## 2017-07-09 ENCOUNTER — Encounter: Payer: Self-pay | Admitting: Internal Medicine

## 2017-07-09 ENCOUNTER — Ambulatory Visit (INDEPENDENT_AMBULATORY_CARE_PROVIDER_SITE_OTHER): Payer: Medicare Other | Admitting: Internal Medicine

## 2017-07-09 VITALS — BP 154/84 | HR 83 | Temp 97.8°F | Resp 17 | Ht 71.0 in | Wt 357.8 lb

## 2017-07-09 DIAGNOSIS — I739 Peripheral vascular disease, unspecified: Secondary | ICD-10-CM | POA: Diagnosis not present

## 2017-07-09 DIAGNOSIS — I83891 Varicose veins of right lower extremities with other complications: Secondary | ICD-10-CM

## 2017-07-09 DIAGNOSIS — E669 Obesity, unspecified: Secondary | ICD-10-CM

## 2017-07-09 DIAGNOSIS — Z6841 Body Mass Index (BMI) 40.0 and over, adult: Secondary | ICD-10-CM | POA: Diagnosis not present

## 2017-07-09 DIAGNOSIS — IMO0001 Reserved for inherently not codable concepts without codable children: Secondary | ICD-10-CM

## 2017-07-09 DIAGNOSIS — Z23 Encounter for immunization: Secondary | ICD-10-CM | POA: Diagnosis not present

## 2017-07-09 MED ORDER — TRAMADOL HCL 50 MG PO TABS: 50.0000 mg | ORAL_TABLET | Freq: Four times a day (QID) | ORAL | 0 refills | Status: DC | PRN

## 2017-07-09 NOTE — Progress Notes (Signed)
Location:  [facility] clinic Provider: Cyncere Ruhe L. Mariea Clonts, D.O., C.M.D.  Code Status: full code Goals of Care:  Advanced Directives 07/09/2017  Does Patient Have a Medical Advance Directive? No  Type of Advance Directive -  Does patient want to make changes to medical advance directive? -  Copy of [place] in Chart? -  Would patient like information on creating a medical advance directive? -  Pre-existing out of facility DNR order (yellow form or pink MOST form) -   Chief Complaint  Patient presents with  . Acute Visit    Pt is being seen due to problems with veins in legs. Refill on tramadol.    HPI: Patient is a 71 y.o. male seen today for an acute visit for problems with veins in his legs.  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  Developed varicose veins.  Now having small spots that pop up and seem like they are little blood vessels, then they dry up.  More on the right than left leg.  Also his right leg swells more.  He is not thrilled with the idea of compression socks, but did wear them back when he worked.    Weight up two lbs since last visit.  Encouraged him to f/u with endocrine as recommended sooner than dec/jan as was planned to help address getting his hormones regulated since the pituitary surgery  Past Medical History:  Diagnosis Date  . Asymptomatic varicose veins   . Chronic kidney disease   . Diaphragmatic hernia without mention of obstruction or gangrene   . DM (diabetes mellitus) ([place])   . GERD (gastroesophageal reflux disease)   . Gout, unspecified   . Hypercalcemia   . Hyperlipidemia   . Hyperosmolality and/or hypernatremia   . Hypertension   . Monoclonal paraproteinemia   . New daily persistent headache   . Osteoarthrosis, unspecified whether generalized or localized, lower leg   . Other malaise and fatigue   . Primary localized osteoarthrosis, unspecified site   . Unspecified disorder of kidney and  ureter   . Unspecified hereditary and idiopathic peripheral neuropathy     Past Surgical History:  Procedure Laterality Date  . CARPAL TUNNEL RELEASE  2007  . COLONOSCOPY N/A 06/06/2013   Procedure: COLONOSCOPY;  Surgeon: Cleotis Nipper, MD;  Location: [facility] ENDOSCOPY;  Service: Endoscopy;  Laterality: N/A;  . TUMOR REMOVAL  2017   Pituitary adenoma    No Known Allergies  Outpatient Encounter Prescriptions as of 07/09/2017  Medication Sig  . amLODipine (NORVASC) 10 MG tablet TAKE 1 TABLET DAILY FOR BLOOD PRESSURE  . aspirin EC 81 MG tablet Take 1 tablet (81 mg total) by mouth daily.  Marland Kitchen atorvastatin (LIPITOR) 40 MG tablet TAKE 1 TABLET DAILY  . chlorthalidone (HYGROTON) 25 MG tablet TAKE 1 TABLET DAILY FOR BLOOD PRESSURE  . Cholecalciferol (VITAMIN D3) 2000 units TABS Take by mouth daily.  Marland Kitchen FREESTYLE LITE test strip CHECK BLOOD SUGAR THREE TIMES A DAY  . gabapentin (NEURONTIN) 300 MG capsule Take 1 capsule (300 mg total) by mouth 3 (three) times daily.  . insulin lispro (HUMALOG KWIKPEN) 100 UNIT/ML KiwkPen Inject 22 units before breakfast, 26 units before lunch & supper - plus sliding scale.  . Iron-FA-B Cmp-C-Biot-Probiotic (FUSION PLUS) CAPS Take 1 capsule by mouth every other day.  Marland Kitchen KLOR-CON M20 20 MEQ tablet TAKE 1 TABLET TWICE A DAY  . LANTUS SOLOSTAR 100 UNIT/ML Solostar  Pen INJECT 60 UNITS UNDER THE SKIN DAILY AT 10 P.M.  . losartan (COZAAR) 100 MG tablet TAKE 1 TABLET DAILY  . pantoprazole (PROTONIX) 40 MG tablet TAKE 1 TABLET DAILY  . SURE COMFORT PEN NEEDLES 32G X 4 MM MISC USE FOUR TIMES A DAY BEFORE MEALS  . traMADol (ULTRAM) 50 MG tablet Take 50 mg by mouth every 6 (six) hours as needed for moderate pain or severe pain.  . [DISCONTINUED] ZOLMitriptan (ZOMIG) 2.5 MG tablet Take 1 tablet (2.5 mg total) by mouth once. May repeat in 2 hours if headache persists or recurs.   No facility-administered encounter medications on file as of 07/09/2017.     Review of Systems:    Review of Systems  Constitutional: Negative for chills, fever and malaise/fatigue.  Respiratory: Negative for shortness of breath.   Cardiovascular: Positive for leg swelling. Negative for chest pain and palpitations.  Gastrointestinal: Negative for abdominal pain.  Musculoskeletal: Positive for joint pain. Negative for falls.  Neurological: Positive for headaches. Negative for dizziness.  Psychiatric/Behavioral: Negative for depression and memory loss.    Health Maintenance  Topic Date Due  . Hepatitis C Screening  1945-11-07  . INFLUENZA VACCINE  05/13/2017  . OPHTHALMOLOGY EXAM  06/20/2017  . HEMOGLOBIN A1C  10/24/2017  . FOOT EXAM  04/09/2018  . TETANUS/TDAP  04/13/2023  . COLONOSCOPY  06/07/2023  . PNA vac Low Risk Adult  Completed    Physical Exam: Vitals:   07/09/17 1208  BP: (!) 154/84  Pulse: 83  Resp: 17  Temp: 97.8 F (36.6 C)  TempSrc: Oral  SpO2: 94%  Weight: (!) 357 lb 12.8 oz (162.3 kg)  Height: 5\' 11"  (1.803 m)   Body mass index is 49.9 kg/m. Physical Exam  Constitutional: He is oriented to person, place, and time. No distress.  Obese male  Cardiovascular: Normal rate, regular rhythm, normal heart sounds and intact distal pulses.   Right greater than left edema, varicose veins slightly more prominent on right vs left leg, no tenderness on exam  Pulmonary/Chest: Effort normal and breath sounds normal. No respiratory distress.  Abdominal: Bowel sounds are normal.  Musculoskeletal: Normal range of motion.  Neurological: He is alert and oriented to person, place, and time.  Skin: Skin is warm and dry.    Labs reviewed: Basic Metabolic Panel:  Recent Labs  10/03/16 0811 11/21/16 01/22/17 0840 04/23/17 0810  NA 137  --  140 138  K 3.8  --  3.6 3.3*  CL 100  --  100 100  CO2 28  --  29 27  GLUCOSE 134*  --  141* 115*  BUN 18  --  24 25  CREATININE 1.21  --  1.12 1.26*  CALCIUM 9.4  --  10.1 9.6  TSH 4.00 3.25  --   --    Liver Function  Tests:  Recent Labs  10/03/16 0811 01/22/17 0840 04/23/17 0804 04/23/17 0810  AST 12 13  --  15  ALT 10 16  --  15  ALKPHOS 78 88  --  78  BILITOT 0.3 0.3  --  0.3  PROT 7.0 7.5  --  7.0  ALBUMIN 3.4* 3.7  3.8 3.7 3.7   No results for input(s): LIPASE, AMYLASE in the last 8760 hours. No results for input(s): AMMONIA in the last 8760 hours. CBC:  Recent Labs  10/03/16 0811  WBC 9.9  NEUTROABS 5,544  HGB 11.6*  HCT 35.8*  MCV 82.7  PLT 239  Lipid Panel:  Recent Labs  10/03/16 0811  CHOL 141  HDL 59  LDLCALC 73  TRIG 46  CHOLHDL 2.4   Lab Results  Component Value Date   HGBA1C 6.7 (H) 04/23/2017    Assessment/Plan 1. Claudication of both lower extremities ([place]) -r/o arterial etiology to pain and varicosities - traMADol (ULTRAM) 50 MG tablet; Take 1 tablet (50 mg total) by mouth every 6 (six) hours as needed for moderate pain or severe pain.  Dispense: 90 tablet; Refill: 0 - VAS [place] ABI WITH/WO TBI; Future  2. Varicose veins of right leg with edema -r/o arterial etiology, but seems he will need compression for his veins to help his swelling and discomfort - traMADol (ULTRAM) 50 MG tablet; Take 1 tablet (50 mg total) by mouth every 6 (six) hours as needed for moderate pain or severe pain.  Dispense: 90 tablet; Refill: 0  3. Class 3 obesity with serious comorbidity and body mass index (BMI) of 50.0 to 59.9 in adult, unspecified obesity type -ongoing, advised endo f/u for hormonal adjustments  4. Need for influenza vaccination - Flu vaccine HIGH DOSE PF (Fluzone High dose) given  Labs/tests ordered:   Orders Placed This Encounter  Procedures  . Flu vaccine HIGH DOSE PF (Fluzone High dose)   Next appt:  08/10/2017  Constantino Starace L. Daxton Nydam, D.O. [facility] [address], [place] [address] Cell Phone (Mon-Fri 8am-5pm):  [phone] On Call:  [phone] & follow prompts after 5pm & weekends Office Phone:   [phone] Office Fax:  [phone]

## 2017-07-13 ENCOUNTER — Ambulatory Visit (HOSPITAL_COMMUNITY)
Admission: RE | Admit: 2017-07-13 | Discharge: 2017-07-13 | Disposition: A | Discharge status: Home / Self Care | Payer: Medicare Other | Source: Ambulatory Visit | Attending: Surgery | Admitting: Surgery

## 2017-07-13 ENCOUNTER — Encounter: Payer: Self-pay | Admitting: *Deleted

## 2017-07-13 DIAGNOSIS — I739 Peripheral vascular disease, unspecified: Secondary | ICD-10-CM | POA: Diagnosis not present

## 2017-07-20 ENCOUNTER — Other Ambulatory Visit: Payer: Self-pay | Admitting: *Deleted

## 2017-07-20 MED ORDER — FUSION PLUS PO CAPS: 1.0000 | ORAL_CAPSULE | ORAL | 3 refills | Status: DC

## 2017-07-20 NOTE — Telephone Encounter (Signed)
Patient requested to be sent to mail order. 

## 2017-07-30 ENCOUNTER — Other Ambulatory Visit: Payer: Self-pay

## 2017-07-30 DIAGNOSIS — I83891 Varicose veins of right lower extremities with other complications: Secondary | ICD-10-CM

## 2017-07-30 DIAGNOSIS — I739 Peripheral vascular disease, unspecified: Secondary | ICD-10-CM

## 2017-07-30 MED ORDER — TRAMADOL HCL 50 MG PO TABS: 50.0000 mg | ORAL_TABLET | Freq: Four times a day (QID) | ORAL | 0 refills | Status: DC | PRN

## 2017-08-10 ENCOUNTER — Ambulatory Visit (INDEPENDENT_AMBULATORY_CARE_PROVIDER_SITE_OTHER): Payer: Medicare Other | Admitting: Internal Medicine

## 2017-08-10 ENCOUNTER — Encounter: Payer: Self-pay | Admitting: Internal Medicine

## 2017-08-10 VITALS — BP 120/80 | HR 80 | Temp 98.0°F | Wt 355.0 lb

## 2017-08-10 DIAGNOSIS — Z794 Long term (current) use of insulin: Secondary | ICD-10-CM | POA: Diagnosis not present

## 2017-08-10 DIAGNOSIS — E1169 Type 2 diabetes mellitus with other specified complication: Secondary | ICD-10-CM | POA: Diagnosis not present

## 2017-08-10 DIAGNOSIS — Z6841 Body Mass Index (BMI) 40.0 and over, adult: Secondary | ICD-10-CM

## 2017-08-10 DIAGNOSIS — E785 Hyperlipidemia, unspecified: Secondary | ICD-10-CM | POA: Diagnosis not present

## 2017-08-10 DIAGNOSIS — E229 Hyperfunction of pituitary gland, unspecified: Secondary | ICD-10-CM | POA: Diagnosis not present

## 2017-08-10 DIAGNOSIS — D352 Benign neoplasm of pituitary gland: Secondary | ICD-10-CM

## 2017-08-10 DIAGNOSIS — E291 Testicular hypofunction: Secondary | ICD-10-CM | POA: Diagnosis not present

## 2017-08-10 DIAGNOSIS — I10 Essential (primary) hypertension: Secondary | ICD-10-CM

## 2017-08-10 DIAGNOSIS — E119 Type 2 diabetes mellitus without complications: Secondary | ICD-10-CM

## 2017-08-10 NOTE — Progress Notes (Signed)
Location:   [place]   Place of Service:   Clinic  Provider: Jackson Coffield L. Mariea Clonts, D.O., C.M.D.  Code Status:  Full code Goals of Care:  Advanced Directives 07/09/2017  Does Patient Have a Medical Advance Directive? No  Type of Advance Directive -  Does patient want to make changes to medical advance directive? -  Copy of Reader in Chart? -  Would patient like information on creating a medical advance directive? -  Pre-existing out of facility DNR order (yellow form or pink MOST form) -     Chief Complaint  Patient presents with  . Medical Management of Chronic Issues    46mth follow-up    HPI: Patient is a 71 y.o. male seen today for medical management of chronic diseases.    Has lost 4 pounds since last visit says he is eating vegetables more. Says he is doing water aerobics and swimming, occasionally uses elliptical. Typically at gym for 2 hours and walks his dogs in the afternoon.  Is supposed to go to [facility] for his pituitary visit but has not got an appointment yet. Says they told him at his age may not benefit from hormone replacement.  Diabetes- Hgb A1C 6.7 in July. Average blood sugar at home 140's but says he has been as good as 112. He says that occasionally he makes a bad decision and it shows up on his meter.  Had eye exam in Sept (07/02/17).    Osteoarthritis- Says his pain is good for the most part, rarely takes pain medicine. Has some Ultram left.   Lower extremity edema-Says he has been having trouble getting his socks on and compression socks are too difficult. Edema is better with elevation.   Morning headaches-Says he saw a specialist and determined it was sinus related. Is using Flonase with good success. He did not have an infection.  Just allergies.    Hypertension-BP today within range. On Amlodipine and losartan with good results.   Past Medical History:  Diagnosis Date  . Asymptomatic varicose veins   . Chronic kidney disease   .  Diaphragmatic hernia without mention of obstruction or gangrene   . DM (diabetes mellitus) ([place])   . GERD (gastroesophageal reflux disease)   . Gout, unspecified   . Hypercalcemia   . Hyperlipidemia   . Hyperosmolality and/or hypernatremia   . Hypertension   . Monoclonal paraproteinemia   . New daily persistent headache   . Osteoarthrosis, unspecified whether generalized or localized, lower leg   . Other malaise and fatigue   . Primary localized osteoarthrosis, unspecified site   . Unspecified disorder of kidney and ureter   . Unspecified hereditary and idiopathic peripheral neuropathy     Past Surgical History:  Procedure Laterality Date  . CARPAL TUNNEL RELEASE  2007  . COLONOSCOPY N/A 06/06/2013   Procedure: COLONOSCOPY;  Surgeon: Cleotis Nipper, MD;  Location: [facility] ENDOSCOPY;  Service: Endoscopy;  Laterality: N/A;  . TUMOR REMOVAL  2017   Pituitary adenoma    No Known Allergies  Outpatient Encounter Prescriptions as of 08/10/2017  Medication Sig  . amLODipine (NORVASC) 10 MG tablet TAKE 1 TABLET DAILY FOR BLOOD PRESSURE  . aspirin EC 81 MG tablet Take 1 tablet (81 mg total) by mouth daily.  Marland Kitchen atorvastatin (LIPITOR) 40 MG tablet TAKE 1 TABLET DAILY  . chlorthalidone (HYGROTON) 25 MG tablet TAKE 1 TABLET DAILY FOR BLOOD PRESSURE  . Cholecalciferol (VITAMIN D3) 2000 units TABS Take by mouth  daily.  Marland Kitchen FREESTYLE LITE test strip CHECK BLOOD SUGAR THREE TIMES A DAY  . gabapentin (NEURONTIN) 300 MG capsule Take 1 capsule (300 mg total) by mouth 3 (three) times daily.  . insulin lispro (HUMALOG KWIKPEN) 100 UNIT/ML KiwkPen Inject 22 units before breakfast, 26 units before lunch & supper - plus sliding scale.  . Iron-FA-B Cmp-C-Biot-Probiotic (FUSION PLUS) CAPS Take 1 capsule by mouth every other day.  Marland Kitchen KLOR-CON M20 20 MEQ tablet TAKE 1 TABLET TWICE A DAY  . LANTUS SOLOSTAR 100 UNIT/ML Solostar Pen INJECT 60 UNITS UNDER THE SKIN DAILY AT 10 P.M.  . losartan (COZAAR) 100 MG tablet  TAKE 1 TABLET DAILY  . pantoprazole (PROTONIX) 40 MG tablet TAKE 1 TABLET DAILY  . SURE COMFORT PEN NEEDLES 32G X 4 MM MISC USE FOUR TIMES A DAY BEFORE MEALS  . [DISCONTINUED] traMADol (ULTRAM) 50 MG tablet Take 1 tablet (50 mg total) by mouth every 6 (six) hours as needed for moderate pain or severe pain.   No facility-administered encounter medications on file as of 08/10/2017.     Review of Systems:  Review of Systems  Constitutional: Negative for chills and fever.  HENT: Positive for sinus pain.   Eyes: Negative for blurred vision.  Respiratory: Negative for cough, shortness of breath and wheezing.   Cardiovascular: Positive for claudication and leg swelling. Negative for chest pain and palpitations.  Gastrointestinal: Positive for heartburn. Negative for constipation and diarrhea.  Genitourinary: Negative for dysuria and urgency.  Musculoskeletal: Positive for joint pain.  Neurological: Positive for headaches.  Psychiatric/Behavioral: Negative for depression. The patient is not nervous/anxious and does not have insomnia.     Health Maintenance  Topic Date Due  . Hepatitis C Screening  09-27-1946  . HEMOGLOBIN A1C  10/24/2017  . FOOT EXAM  04/09/2018  . OPHTHALMOLOGY EXAM  07/02/2018  . TETANUS/TDAP  04/13/2023  . COLONOSCOPY  06/07/2023  . INFLUENZA VACCINE  Completed  . PNA vac Low Risk Adult  Completed    Physical Exam: Vitals:   08/10/17 0827  BP: 120/80  Pulse: 80  Temp: 98 F (36.7 C)  TempSrc: Oral  SpO2: 96%  Weight: (!) 355 lb (161 kg)   Body mass index is 49.51 kg/m. Physical Exam  Constitutional: He is oriented to person, place, and time. He appears well-developed and well-nourished.  HENT:  Head: Normocephalic.  Cardiovascular: Normal rate, regular rhythm and normal heart sounds.   Pulmonary/Chest: Effort normal and breath sounds normal. No respiratory distress.  Abdominal: Soft. Bowel sounds are normal.  Neurological: He is alert and oriented to  person, place, and time.  Skin: Skin is warm and dry. Capillary refill takes less than 2 seconds.  Psychiatric: He has a normal mood and affect. His behavior is normal. Judgment and thought content normal.    Labs reviewed: Basic Metabolic Panel:  Recent Labs  10/03/16 0811 11/21/16 01/22/17 0840 04/23/17 0810  NA 137  --  140 138  K 3.8  --  3.6 3.3*  CL 100  --  100 100  CO2 28  --  29 27  GLUCOSE 134*  --  141* 115*  BUN 18  --  24 25  CREATININE 1.21  --  1.12 1.26*  CALCIUM 9.4  --  10.1 9.6  TSH 4.00 3.25  --   --    Liver Function Tests:  Recent Labs  10/03/16 0811 01/22/17 0840 04/23/17 0804 04/23/17 0810  AST 12 13  --  15  ALT 10 16  --  15  ALKPHOS 78 88  --  78  BILITOT 0.3 0.3  --  0.3  PROT 7.0 7.5  --  7.0  ALBUMIN 3.4* 3.7  3.8 3.7 3.7   No results for input(s): LIPASE, AMYLASE in the last 8760 hours. No results for input(s): AMMONIA in the last 8760 hours. CBC:  Recent Labs  10/03/16 0811  WBC 9.9  NEUTROABS 5,544  HGB 11.6*  HCT 35.8*  MCV 82.7  PLT 239   Lipid Panel:  Recent Labs  10/03/16 0811  CHOL 141  HDL 59  LDLCALC 73  TRIG 46  CHOLHDL 2.4   Lab Results  Component Value Date   HGBA1C 6.7 (H) 04/23/2017    Procedures since last visit: No results found.  Assessment/Plan 1. Essential hypertension Continue with Norvasc,Chlrothalidone and Cozaar  2. Hyperlipidemia associated with type 2 diabetes mellitus (HCC) Continue lipitor  3. Pituitary microadenoma with hyperprolactinemia ([place]) Follow up with [facility], is having issues getting appointment scheduled.   4. Class 3 severe obesity with serious comorbidity and body mass index (BMI) of 50.0 to 59.9 in adult, unspecified obesity type ([place]) Starting to lose weight, continue exercise and diet plan.  5. Testosterone deficiency in male Has been advised at [facility] that he is not a good candidate for hormone replacement  6. Type 2 diabetes mellitus without complication, with  long-term current use of insulin (HCC) Has been well controlled, on asa, statin, insulin regimen, ARB Last hba1c at goal  Labs/tests ordered:  No orders of the defined types were placed in this encounter.  Next appt:  12/10/2017 med mgt   Dudley Cooley L. Sharee Sturdy, D.O. [facility] [address], [place] [address] Cell Phone (Mon-Fri 8am-5pm):  [phone] On Call:  [phone] & follow prompts after 5pm & weekends Office Phone:  [phone] Office Fax:  [phone]

## 2017-09-11 ENCOUNTER — Telehealth: Payer: Self-pay

## 2017-09-11 NOTE — Telephone Encounter (Signed)
I'd imagine he's referring to his losartan.  It's made my multiple manufacturers.  Typically the pharmacy will contact patients if their particular med is part of the recall.  I suggest he call them to confirm.

## 2017-09-11 NOTE — Telephone Encounter (Signed)
Called patient with Dr. Cyndi Lennert response he expressed understanding.

## 2017-09-11 NOTE — Telephone Encounter (Signed)
Patient is concered about the BP meds he is taking because he stated that there is a recall and they could cause cancer. Please advise

## 2017-09-15 ENCOUNTER — Other Ambulatory Visit: Payer: Self-pay | Admitting: Internal Medicine

## 2017-10-14 DIAGNOSIS — N183 Chronic kidney disease, stage 3 (moderate): Secondary | ICD-10-CM | POA: Diagnosis not present

## 2017-10-14 DIAGNOSIS — Z8639 Personal history of other endocrine, nutritional and metabolic disease: Secondary | ICD-10-CM | POA: Diagnosis not present

## 2017-10-14 DIAGNOSIS — N189 Chronic kidney disease, unspecified: Secondary | ICD-10-CM | POA: Diagnosis not present

## 2017-10-16 ENCOUNTER — Other Ambulatory Visit: Payer: Self-pay | Admitting: Internal Medicine

## 2017-10-22 DIAGNOSIS — N183 Chronic kidney disease, stage 3 (moderate): Secondary | ICD-10-CM | POA: Diagnosis not present

## 2017-10-22 DIAGNOSIS — D631 Anemia in chronic kidney disease: Secondary | ICD-10-CM | POA: Diagnosis not present

## 2017-10-22 DIAGNOSIS — I129 Hypertensive chronic kidney disease with stage 1 through stage 4 chronic kidney disease, or unspecified chronic kidney disease: Secondary | ICD-10-CM | POA: Diagnosis not present

## 2017-10-22 DIAGNOSIS — Z8639 Personal history of other endocrine, nutritional and metabolic disease: Secondary | ICD-10-CM | POA: Diagnosis not present

## 2017-11-05 ENCOUNTER — Other Ambulatory Visit: Payer: Self-pay

## 2017-11-05 ENCOUNTER — Other Ambulatory Visit: Payer: Medicare Other

## 2017-11-05 DIAGNOSIS — E119 Type 2 diabetes mellitus without complications: Secondary | ICD-10-CM

## 2017-11-05 DIAGNOSIS — Z794 Long term (current) use of insulin: Principal | ICD-10-CM

## 2017-11-06 ENCOUNTER — Other Ambulatory Visit: Payer: Medicare Other

## 2017-11-06 DIAGNOSIS — E119 Type 2 diabetes mellitus without complications: Secondary | ICD-10-CM | POA: Diagnosis not present

## 2017-11-06 DIAGNOSIS — Z794 Long term (current) use of insulin: Secondary | ICD-10-CM | POA: Diagnosis not present

## 2017-11-07 LAB — MICROALBUMIN / CREATININE URINE RATIO
Creatinine, Urine: 86 mg/dL (ref 20–320)
Microalb Creat Ratio: 22 mcg/mg creat (ref <30)
Microalb, Ur: 1.9 mg/dL

## 2017-11-07 LAB — COMPLETE METABOLIC PANEL WITH GFR
AG Ratio: 1.1 (calc) (ref 1.0–2.5)
ALT: 14 U/L (ref 9–46)
AST: 16 U/L (ref 10–35)
Albumin: 3.9 g/dL (ref 3.6–5.1)
Alkaline phosphatase (APISO): 74 U/L (ref 40–115)
BUN/Creatinine Ratio: 20 (calc) (ref 6–22)
BUN: 26 mg/dL — ABNORMAL HIGH (ref 7–25)
CO2: 31 mmol/L (ref 20–32)
Calcium: 9.7 mg/dL (ref 8.6–10.3)
Chloride: 98 mmol/L (ref 98–110)
Creat: 1.28 mg/dL — ABNORMAL HIGH (ref 0.70–1.18)
GFR, Est African American: 65 mL/min/{1.73_m2} (ref >=60)
GFR, Est Non African American: 56 mL/min/{1.73_m2} — ABNORMAL LOW (ref >=60)
Globulin: 3.4 g/dL (calc) (ref 1.9–3.7)
Glucose, Bld: 164 mg/dL — ABNORMAL HIGH (ref 65–99)
Potassium: 3.8 mmol/L (ref 3.5–5.3)
Sodium: 137 mmol/L (ref 135–146)
Total Bilirubin: 0.3 mg/dL (ref 0.2–1.2)
Total Protein: 7.3 g/dL (ref 6.1–8.1)

## 2017-11-07 LAB — HEMOGLOBIN A1C
Hgb A1c MFr Bld: 7.6 % of total Hgb — ABNORMAL HIGH (ref <5.7)
Mean Plasma Glucose: 171 (calc)
eAG (mmol/L): 9.5 (calc)

## 2017-11-08 ENCOUNTER — Other Ambulatory Visit: Payer: Self-pay | Admitting: Internal Medicine

## 2017-11-09 ENCOUNTER — Ambulatory Visit (INDEPENDENT_AMBULATORY_CARE_PROVIDER_SITE_OTHER): Payer: Medicare Other | Admitting: Pharmacotherapy

## 2017-11-09 ENCOUNTER — Encounter: Payer: Self-pay | Admitting: Pharmacotherapy

## 2017-11-09 VITALS — BP 124/80 | HR 96 | Temp 98.0°F | Ht 71.0 in | Wt 364.0 lb

## 2017-11-09 DIAGNOSIS — Z794 Long term (current) use of insulin: Secondary | ICD-10-CM | POA: Diagnosis not present

## 2017-11-09 DIAGNOSIS — I10 Essential (primary) hypertension: Secondary | ICD-10-CM | POA: Diagnosis not present

## 2017-11-09 DIAGNOSIS — E119 Type 2 diabetes mellitus without complications: Secondary | ICD-10-CM

## 2017-11-09 NOTE — Progress Notes (Signed)
  Subjective:    Adrian Matthews is a 72 y.o.African American male who presents for follow-up of Type 2 diabetes mellitus.   A1C: 7.6% (was 6.7%) EGFR: 70m/min SCr: 1.28  Lantus 60 units daily Humalog 22 units breakfast, 26 units lunch & supper  Says holiday eating "got the best of me" He saw nephrologist, Dr PPosey Pronto Drinking 64 ounces of water daily at a minimum. Swimming for exercise. Plans to start chair exercises.  Denies problems with vision.  Saw Dr GDelman Cheadle Has peripheral edema. Denies problems with feet. Nocturia 3 times per night No dysuria   Review of Systems A comprehensive review of systems was negative except for: Cardiovascular: positive for lower extremity edema Genitourinary: positive for nocturia    Objective:    BP 124/80   Pulse 96   Temp 98 F (36.7 C) (Oral)   Ht _0  (1.803 m)   Wt (!) 364 lb (165.1 kg)   SpO2 97%   BMI 50.77 kg/m   General:  alert, cooperative and no distress  Oropharynx: normal findings: lips normal without lesions and gums healthy   Eyes:  negative findings: lids and lashes normal and conjunctivae and sclerae normal   Ears:  external ears normal        Lung: clear to auscultation bilaterally  Heart:  regular rate and rhythm     Extremities: edema bilateral lower extremities  Skin: dry     Neuro: mental status, speech normal, alert and oriented x3 and gait and station normal   Lab Review Glucose, Bld (mg/dL)  Date Value  11/06/2017 164 (H)  04/23/2017 115 (H)  01/22/2017 141 (H)   CO2 (mmol/L)  Date Value  11/06/2017 31  04/23/2017 27  01/22/2017 29   BUN (mg/dL)  Date Value  11/06/2017 26 (H)  04/23/2017 25  01/22/2017 24  03/12/2016 23  02/26/2016 26  10/19/2015 15   Creat (mg/dL)  Date Value  11/06/2017 1.28 (H)  04/23/2017 1.26 (H)  01/22/2017 1.12       Assessment:    Diabetes Mellitus type II, under good control.  A1C higher due to holiday eating. BP at goal <130/80   Plan:    1.   Rx changes: none  2.  Continue Lantus 60 units daily 3.  Continue Humalog 22 units breakfast, 26 units lunch & supper 4.  Counseled on nutrition goals. 5.  Praised exercise efforts. 6.  BP at goal <130/80. 7.  RTC in 3 months

## 2017-11-09 NOTE — Patient Instructions (Signed)
Get  back on track

## 2017-11-13 DIAGNOSIS — E221 Hyperprolactinemia: Secondary | ICD-10-CM | POA: Diagnosis not present

## 2017-11-13 DIAGNOSIS — D532 Scorbutic anemia: Secondary | ICD-10-CM | POA: Diagnosis not present

## 2017-11-13 DIAGNOSIS — Z6841 Body Mass Index (BMI) 40.0 and over, adult: Secondary | ICD-10-CM | POA: Diagnosis not present

## 2017-11-13 DIAGNOSIS — D352 Benign neoplasm of pituitary gland: Secondary | ICD-10-CM | POA: Diagnosis not present

## 2017-11-13 DIAGNOSIS — N183 Chronic kidney disease, stage 3 (moderate): Secondary | ICD-10-CM | POA: Diagnosis not present

## 2017-11-13 DIAGNOSIS — Z794 Long term (current) use of insulin: Secondary | ICD-10-CM | POA: Insufficient documentation

## 2017-11-13 DIAGNOSIS — D126 Benign neoplasm of colon, unspecified: Secondary | ICD-10-CM | POA: Diagnosis not present

## 2017-11-13 DIAGNOSIS — E1129 Type 2 diabetes mellitus with other diabetic kidney complication: Secondary | ICD-10-CM | POA: Diagnosis not present

## 2017-11-13 DIAGNOSIS — E291 Testicular hypofunction: Secondary | ICD-10-CM | POA: Insufficient documentation

## 2017-11-13 DIAGNOSIS — N08 Glomerular disorders in diseases classified elsewhere: Secondary | ICD-10-CM | POA: Diagnosis not present

## 2017-11-17 ENCOUNTER — Other Ambulatory Visit: Payer: Self-pay | Admitting: Endocrinology

## 2017-11-17 DIAGNOSIS — D352 Benign neoplasm of pituitary gland: Secondary | ICD-10-CM

## 2017-11-17 DIAGNOSIS — E221 Hyperprolactinemia: Secondary | ICD-10-CM

## 2017-11-24 ENCOUNTER — Other Ambulatory Visit: Payer: Self-pay | Admitting: Internal Medicine

## 2017-11-28 ENCOUNTER — Ambulatory Visit
Admission: RE | Admit: 2017-11-28 | Discharge: 2017-11-28 | Disposition: A | Discharge status: Home / Self Care | Payer: Medicare Other | Source: Ambulatory Visit | Attending: Endocrinology | Admitting: Endocrinology

## 2017-11-28 DIAGNOSIS — E221 Hyperprolactinemia: Secondary | ICD-10-CM

## 2017-11-28 DIAGNOSIS — D352 Benign neoplasm of pituitary gland: Secondary | ICD-10-CM | POA: Diagnosis not present

## 2017-11-28 IMAGING — CT CT HEAD W/O CM
2 series · 15 of 30 positions shown, 17 images · non-contrast
Comparison: May 07, 2011

CLINICAL DATA: Migraine-type headache for 2 weeks.  Hypertension.

EXAM:
CT HEAD WITHOUT CONTRAST
TECHNIQUE: Contiguous axial images were obtained from the base of the skull
through the vertex without intravenous contrast.

[Series 2: head without · axial · non-contrast · 0.55mm/px · z∈[+1301,+1421]mm · 7 of 34 slices shown, 9 images]
[im 5/34  brain]
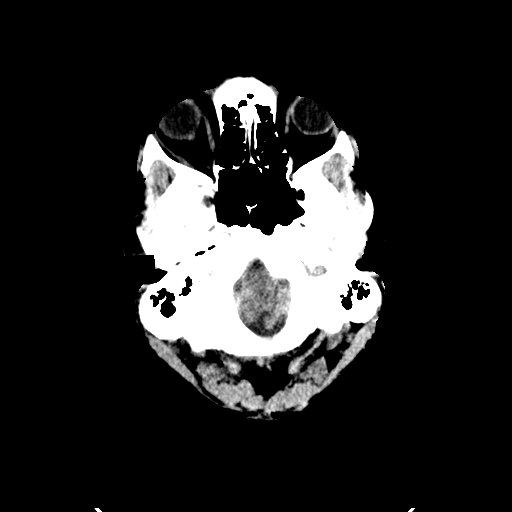
[im 5/34  bone]
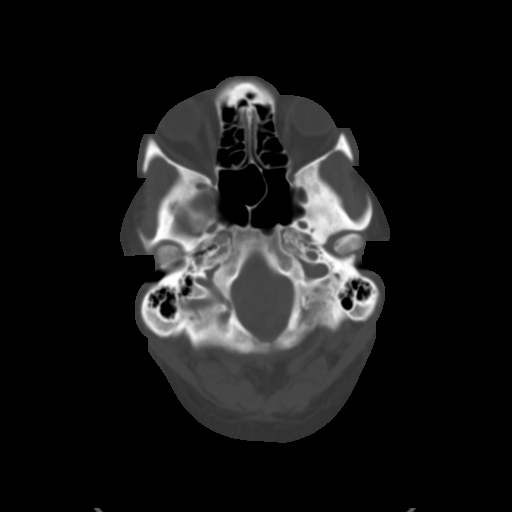
[im 9/34  brain]
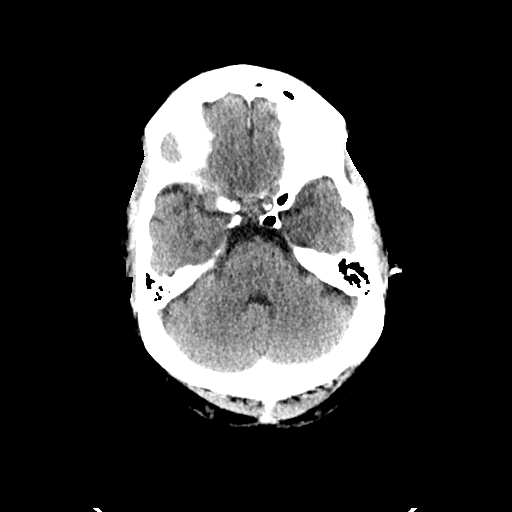
[im 13/34  brain]
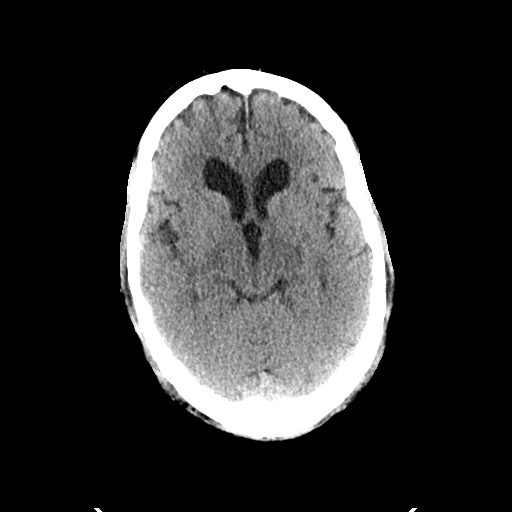
[im 17/34  brain]
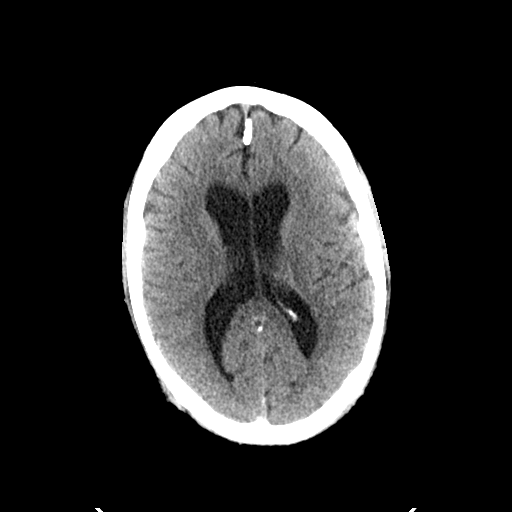
[im 21/34  brain]
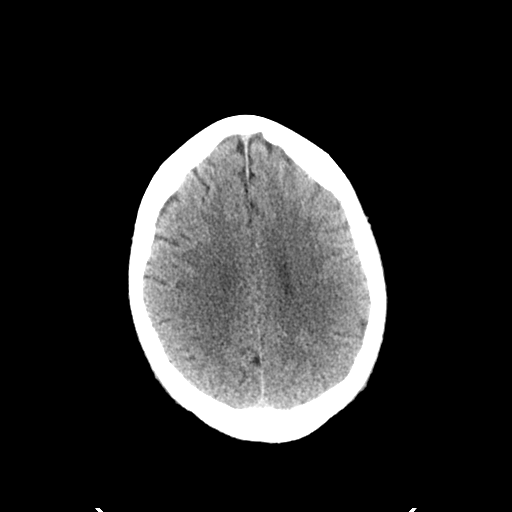
[im 21/34  bone]
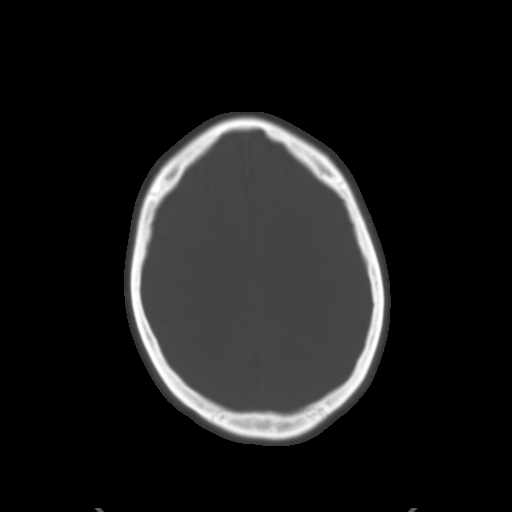
[im 25/34  brain]
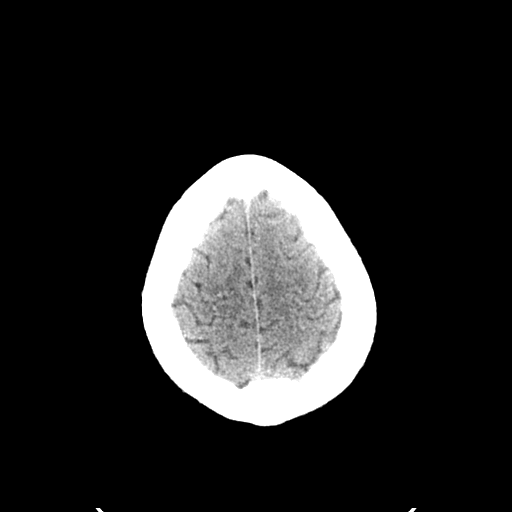
[im 29/34  brain]
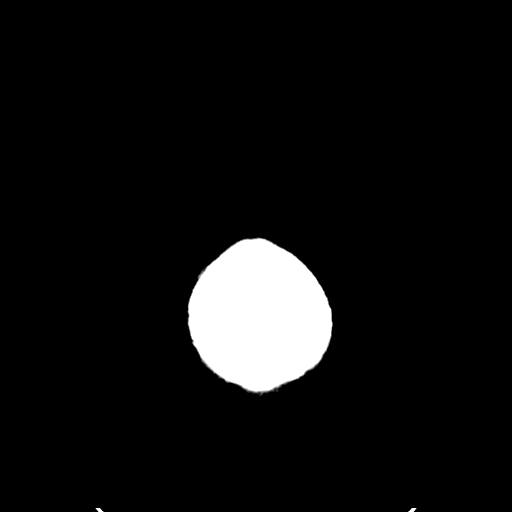

[Series 3: head bone · axial · 0.55mm/px · z∈[+1297,+1429]mm · 8 of 84 slices shown]
[im 9/84  bone]
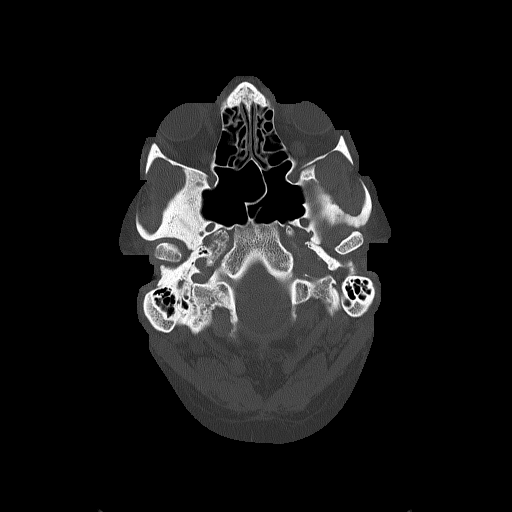
[im 17/84  bone]
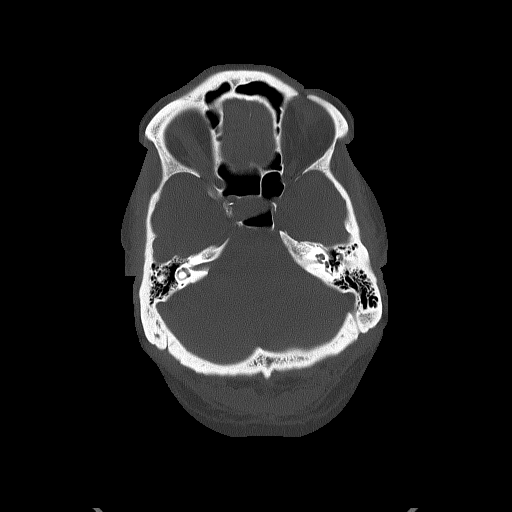
[im 25/84  bone]
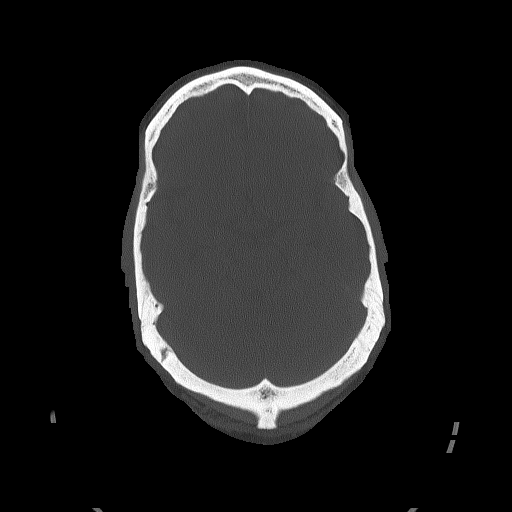
[im 38/84  bone]
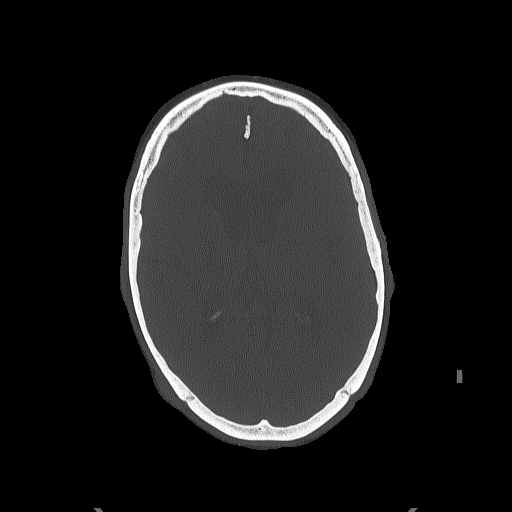
[im 46/84  bone]
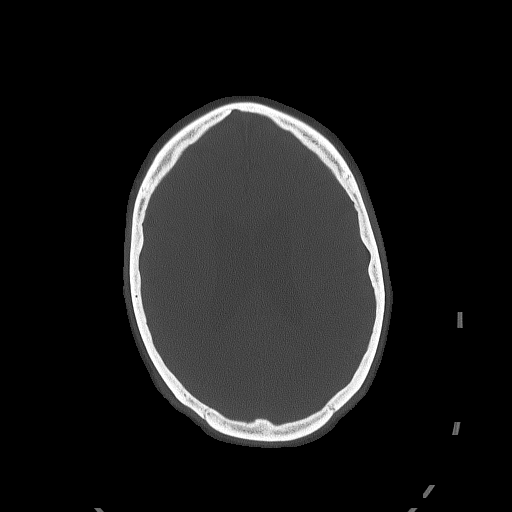
[im 59/84  bone]
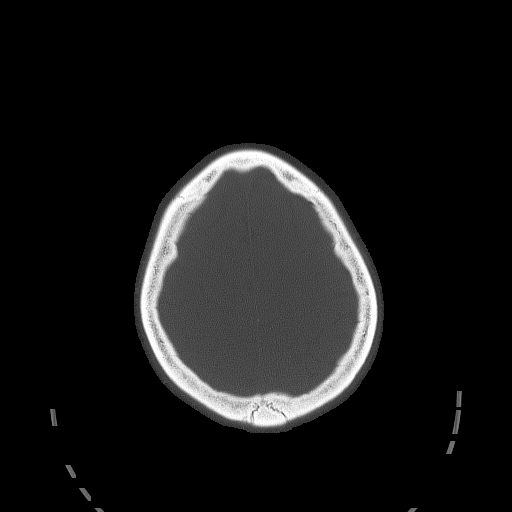
[im 67/84  bone]
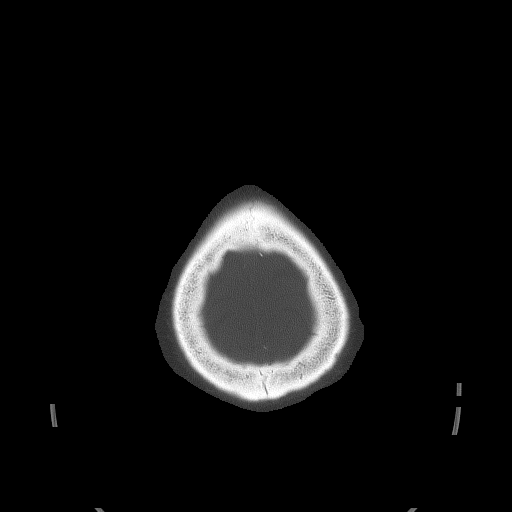
[im 75/84  bone]
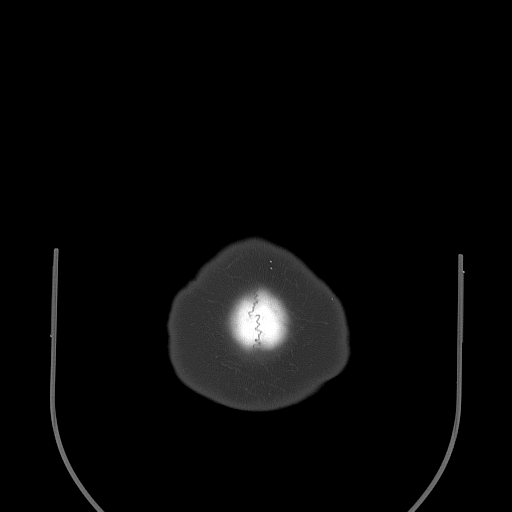

[15 of 30 positions shown; findings below may reference images not displayed]

FINDINGS: The ventricles are mildly prominent in a generalized manner, a
stable finding. The sulci appear within normal limits. There is no
intracranial mass, hemorrhage, extra-axial fluid collection, or
midline shift. There is rather minimal small vessel disease in the
centra semiovale bilaterally. Elsewhere, gray-white compartments
appear normal. No acute infarct evident. The bony calvarium appears
intact. The mastoid air cells are clear. There is opacification of
much of the right sphenoid sinus region. No intraorbital lesions are
evident.
IMPRESSION: Stable mild generalized ventricular prominence. The sulci appear
normal. The significance of this mild ventricular prominence is
uncertain. Question any clinical findings suggesting potential
normal pressure hydrocephalus.

There is minimal periventricular small vessel disease. No acute
infarct evident. No hemorrhage or mass effect.

There is right sphenoid sinus opacification.

## 2017-11-28 MED ORDER — GADOBENATE DIMEGLUMINE 529 MG/ML IV SOLN
10.0000 mL | Freq: Once | INTRAVENOUS | Status: AC | PRN
  Administered 2017-11-28: 10 mL via INTRAVENOUS

## 2017-12-10 ENCOUNTER — Encounter: Payer: Medicare Other | Admitting: Internal Medicine

## 2017-12-10 ENCOUNTER — Encounter: Payer: Self-pay | Admitting: Internal Medicine

## 2017-12-11 NOTE — Progress Notes (Signed)
This encounter was created in error - please disregard.

## 2017-12-17 ENCOUNTER — Ambulatory Visit (INDEPENDENT_AMBULATORY_CARE_PROVIDER_SITE_OTHER): Payer: Medicare Other | Admitting: Internal Medicine

## 2017-12-17 ENCOUNTER — Encounter: Payer: Self-pay | Admitting: Internal Medicine

## 2017-12-17 VITALS — BP 130/78 | HR 86 | Temp 97.7°F | Wt 368.0 lb

## 2017-12-17 DIAGNOSIS — E1169 Type 2 diabetes mellitus with other specified complication: Secondary | ICD-10-CM | POA: Diagnosis not present

## 2017-12-17 DIAGNOSIS — Z794 Long term (current) use of insulin: Secondary | ICD-10-CM | POA: Diagnosis not present

## 2017-12-17 DIAGNOSIS — E1122 Type 2 diabetes mellitus with diabetic chronic kidney disease: Secondary | ICD-10-CM | POA: Diagnosis not present

## 2017-12-17 DIAGNOSIS — E229 Hyperfunction of pituitary gland, unspecified: Secondary | ICD-10-CM | POA: Diagnosis not present

## 2017-12-17 DIAGNOSIS — N183 Chronic kidney disease, stage 3 unspecified: Secondary | ICD-10-CM | POA: Insufficient documentation

## 2017-12-17 DIAGNOSIS — D352 Benign neoplasm of pituitary gland: Secondary | ICD-10-CM

## 2017-12-17 DIAGNOSIS — E785 Hyperlipidemia, unspecified: Secondary | ICD-10-CM | POA: Diagnosis not present

## 2017-12-17 DIAGNOSIS — Z6841 Body Mass Index (BMI) 40.0 and over, adult: Secondary | ICD-10-CM

## 2017-12-17 DIAGNOSIS — I10 Essential (primary) hypertension: Secondary | ICD-10-CM

## 2017-12-17 NOTE — Progress Notes (Signed)
Location:  [facility] clinic Provider:  Karl Erway L. Mariea Clonts, D.O., C.M.D.  Code Status: Full code, but then does not want to be kept alive on machines. Goals of Care:  Advanced Directives 12/17/2017  Does Patient Have a Medical Advance Directive? No  Type of Advance Directive -  Does patient want to make changes to medical advance directive? -  Copy of [place] in Chart? -  Would patient like information on creating a medical advance directive? No - Patient declined  Pre-existing out of facility DNR order (yellow form or pink MOST form) -  His brother is his 43.  I asked him to bring a copy for me.  He has to check his files and his bank.    Chief Complaint  Patient presents with  . Medical Management of Chronic Issues    55mth follow-up  . ACP    no ACP    HPI: Patient is a 72 y.o. male seen today for medical management of chronic diseases.    Says he's doing good and feels fine.    Not doing anything for his arthritis beside getting under the blanket.  Very seldom takes a pill.  He knows doing his aerobics will help--water aerobics and swimming mon, wed, fri, sometimes tues, thurs he swims, too.  He didn't get up in time to eat anything.    Dr. Forde Dandy had done his f/u MRI and labs for pituitary.  He said he doesn't need hormone therapy and tumor was still gone.  He's not going up to [place] anymore.     Reviewed Dr. Serita Grit notes.  Pt says he was dehydrated there and he's drinking more water now than he was.    Morbid obesity:  Weight is stable.    Hba1c was 7.6 with [facility].   Blames holidays for his sugar.  Refuses to eat rabbit food for every meal.   Past Medical History:  Diagnosis Date  . Asymptomatic varicose veins   . Chronic kidney disease   . Diaphragmatic hernia without mention of obstruction or gangrene   . DM (diabetes mellitus) ([place])   . GERD (gastroesophageal reflux disease)   . Gout, unspecified   . Hypercalcemia   . Hyperlipidemia   .  Hyperosmolality and/or hypernatremia   . Hypertension   . Monoclonal paraproteinemia   . New daily persistent headache   . Osteoarthrosis, unspecified whether generalized or localized, lower leg   . Other malaise and fatigue   . Primary localized osteoarthrosis, unspecified site   . Unspecified disorder of kidney and ureter   . Unspecified hereditary and idiopathic peripheral neuropathy     Past Surgical History:  Procedure Laterality Date  . CARPAL TUNNEL RELEASE  2007  . COLONOSCOPY N/A 06/06/2013   Procedure: COLONOSCOPY;  Surgeon: Cleotis Nipper, MD;  Location: [facility] ENDOSCOPY;  Service: Endoscopy;  Laterality: N/A;  . TUMOR REMOVAL  2017   Pituitary adenoma    No Known Allergies  Outpatient Encounter Medications as of 12/17/2017  Medication Sig  . amLODipine (NORVASC) 10 MG tablet TAKE 1 TABLET DAILY FOR BLOOD PRESSURE  . aspirin EC 81 MG tablet Take 1 tablet (81 mg total) by mouth daily.  Marland Kitchen atorvastatin (LIPITOR) 40 MG tablet TAKE 1 TABLET DAILY  . chlorthalidone (HYGROTON) 25 MG tablet TAKE 1 TABLET DAILY FOR BLOOD PRESSURE  . Cholecalciferol (VITAMIN D3) 2000 units TABS Take by mouth daily.  Marland Kitchen FREESTYLE LITE test strip CHECK BLOOD SUGAR THREE TIMES A DAY  .  gabapentin (NEURONTIN) 300 MG capsule Take 1 capsule (300 mg total) by mouth 3 (three) times daily.  . insulin lispro (HUMALOG KWIKPEN) 100 UNIT/ML KiwkPen Inject 22 units before breakfast, 26 units before lunch & supper - plus sliding scale.  . Iron-FA-B Cmp-C-Biot-Probiotic (FUSION PLUS) CAPS Take 1 capsule by mouth every other day.  Marland Kitchen KLOR-CON M20 20 MEQ tablet TAKE 1 TABLET TWICE A DAY  . LANTUS SOLOSTAR 100 UNIT/ML Solostar Pen INJECT 60 UNITS UNDER THE SKIN DAILY AT 10 P.M.  . losartan (COZAAR) 100 MG tablet TAKE 1 TABLET DAILY  . pantoprazole (PROTONIX) 40 MG tablet TAKE 1 TABLET DAILY  . SURE COMFORT PEN NEEDLES 32G X 4 MM MISC USE FOUR TIMES A DAY BEFORE MEALS   No facility-administered encounter medications on  file as of 12/17/2017.     Review of Systems:  Review of Systems  Constitutional: Negative for chills, fever and malaise/fatigue.  HENT: Negative for congestion and hearing loss.   Eyes: Negative for blurred vision.  Respiratory: Negative for cough and shortness of breath.   Cardiovascular: Negative for chest pain, palpitations and leg swelling.  Gastrointestinal: Negative for abdominal pain, blood in stool, constipation and melena.  Genitourinary: Negative for dysuria, frequency and urgency.  Musculoskeletal: Positive for joint pain. Negative for falls.  Skin: Negative for itching and rash.  Neurological: Negative for dizziness, loss of consciousness and weakness.  Psychiatric/Behavioral: Negative for depression and memory loss. The patient is not nervous/anxious and does not have insomnia.     Health Maintenance  Topic Date Due  . Hepatitis C Screening  1946-06-18  . FOOT EXAM  04/09/2018  . HEMOGLOBIN A1C  05/06/2018  . OPHTHALMOLOGY EXAM  07/02/2018  . TETANUS/TDAP  04/13/2023  . COLONOSCOPY  06/07/2023  . INFLUENZA VACCINE  Completed  . PNA vac Low Risk Adult  Completed    Physical Exam: Vitals:   12/17/17 0833  BP: 130/78  Pulse: 86  Temp: 97.7 F (36.5 C)  TempSrc: Oral  SpO2: 96%  Weight: (!) 368 lb (166.9 kg)   Body mass index is 54.34 kg/m. Physical Exam  Constitutional: He is oriented to person, place, and time. He appears well-developed and well-nourished.  Cardiovascular: Normal rate, regular rhythm, normal heart sounds and intact distal pulses.  Right ankle swelling greater than left chronically  Pulmonary/Chest: Effort normal and breath sounds normal. No respiratory distress.  Abdominal: Bowel sounds are normal.  Musculoskeletal: Normal range of motion.  Neurological: He is alert and oriented to person, place, and time.  Skin: Skin is warm and dry.  Psychiatric: He has a normal mood and affect.    Labs reviewed: Basic Metabolic Panel: Recent Labs      01/22/17 0840 04/23/17 0810 11/06/17 0830  NA 140 138 137  K 3.6 3.3* 3.8  CL 100 100 98  CO2 29 27 31   GLUCOSE 141* 115* 164*  BUN 24 25 26*  CREATININE 1.12 1.26* 1.28*  CALCIUM 10.1 9.6 9.7   Liver Function Tests: Recent Labs    01/22/17 0840 04/23/17 0804 04/23/17 0810 11/06/17 0830  AST 13  --  15 16  ALT 16  --  15 14  ALKPHOS 88  --  78  --   BILITOT 0.3  --  0.3 0.3  PROT 7.5  --  7.0 7.3  ALBUMIN 3.7  3.8 3.7 3.7  --    No results for input(s): LIPASE, AMYLASE in the last 8760 hours. No results for input(s): AMMONIA in the  last 8760 hours. CBC: No results for input(s): WBC, NEUTROABS, HGB, HCT, MCV, PLT in the last 8760 hours. Lipid Panel: No results for input(s): CHOL, HDL, LDLCALC, TRIG, CHOLHDL, LDLDIRECT in the last 8760 hours. Lab Results  Component Value Date   HGBA1C 7.6 (H) 11/06/2017    Procedures since last visit: Mr Jeri Cos BZ Contrast  Result Date: 11/28/2017 CLINICAL DATA:  Pituitary microadenoma. Hyperprolactinemia. Pituitary resection 2017 Creatinine was obtained on site at [facility] at [address] Results: Creatinine 1.2 mg/dL. EXAM: MRI HEAD WITHOUT AND WITH CONTRAST TECHNIQUE: Multiplanar, multiecho pulse sequences of the brain and surrounding structures were obtained without and with intravenous contrast. CONTRAST:  49mL MULTIHANCE GADOBENATE DIMEGLUMINE 529 MG/ML IV SOLN COMPARISON:  MRI head 05/06/2016 FINDINGS: Brain: Pituitary protocol imaging with thin sections and dynamic contrast enhanced images. Interval resection of pituitary tumor. Sella mildly enlarged. Enhancing tissue in the right side of the sella appears attached to the infundibulum and is most consistent with residual normal pituitary tissue measuring 6.4 mm in height and 10 mm in width. No invasion of the cavernous sinus. No compression of the optic chiasm. Mild ventricular enlargement is stable. No acute infarct. Small white matter hyperintensities  bilaterally unchanged. No intracranial hemorrhage. Vascular: Normal arterial flow voids. Skull and upper cervical spine: Negative Sinuses/Orbits: Paranasal sinuses clear.  Normal orbit. Other: None IMPRESSION: Interval resection of pituitary lesion without recurrence. Residual pituitary tissue in the right side of the sella without focal abnormality. Ventricular enlargement stable. Mild chronic white matter changes also stable. Electronically Signed   By: Franchot Gallo M.D.   On: 11/28/2017 10:51    Assessment/Plan 1. Hyperlipidemia associated with type 2 diabetes mellitus ([place]) -ongoing, has not had recent lipid so add to next set of labs with [place] -encouraged him with his healthy diet choices and exercise program -cont lipitor - Lipid panel; Future  2. Class 3 severe obesity with serious comorbidity and body mass index (BMI) of 50.0 to 59.9 in adult, unspecified obesity type ([place]) -weight recently stable, but had trended up considerably after his pituitary tumor resection -cont diet and exercise program  3. Pituitary microadenoma with hyperprolactinemia (HCC) -s/p excision -has opted not to follow up with his surgeon at [facility] anymore and following with Dr. Forde Dandy now locally for his hormonal monitoring and MRI scans--pt reports last MRI showed that the pituitary was crooked, but the tumor has not grown back -he also does not require hormone replacement -I have requested for Dr Baldwin Crown note  4. Essential hypertension -bp is well controlled on current regimen so continue same along with low sodium diet and exercise program  5. Type 2 diabetes mellitus with stage 3 chronic kidney disease, with long-term current use of insulin (HCC) -hba1c was up over the holidays, is back on his diet and exercise, cont lantus with humalog meal coverage, asa, statin, ARB  6. Chronic kidney disease, stage 3 (HCC) -follows with Dr. Posey Pronto, reviewed his note, cr did trend up last time like his hba1c, reports he  is drinking more water now at his recommendation  Labs/tests ordered:   Orders Placed This Encounter  Procedures  . Lipid panel    Standing Status:   Future    Standing Expiration Date:   12/18/2018    Order Specific Question:   Has the patient fasted?    Answer:   Yes  added to [facility]'s labs  Next appt:  02/04/2018 labs and then visit with [facility]; 04/29/2018 with me  Madison Direnzo L. Tacuma Graffam, D.O. [facility] [address], [place] [address] Cell Phone (Mon-Fri 8am-5pm):  [phone] On Call:  [phone] & follow prompts after 5pm & weekends Office Phone:  [phone] Office Fax:  [phone]

## 2018-01-15 ENCOUNTER — Other Ambulatory Visit: Payer: Self-pay | Admitting: Internal Medicine

## 2018-01-15 DIAGNOSIS — K219 Gastro-esophageal reflux disease without esophagitis: Secondary | ICD-10-CM

## 2018-02-04 ENCOUNTER — Other Ambulatory Visit: Payer: Medicare Other

## 2018-02-04 DIAGNOSIS — Z794 Long term (current) use of insulin: Secondary | ICD-10-CM

## 2018-02-04 DIAGNOSIS — E119 Type 2 diabetes mellitus without complications: Secondary | ICD-10-CM

## 2018-02-04 DIAGNOSIS — E1169 Type 2 diabetes mellitus with other specified complication: Secondary | ICD-10-CM | POA: Diagnosis not present

## 2018-02-04 DIAGNOSIS — E785 Hyperlipidemia, unspecified: Principal | ICD-10-CM

## 2018-02-04 LAB — COMPLETE METABOLIC PANEL WITH GFR
AG Ratio: 1 (calc) (ref 1.0–2.5)
ALT: 14 U/L (ref 9–46)
AST: 15 U/L (ref 10–35)
Albumin: 3.6 g/dL (ref 3.6–5.1)
Alkaline phosphatase (APISO): 73 U/L (ref 40–115)
BUN/Creatinine Ratio: 16 (calc) (ref 6–22)
BUN: 21 mg/dL (ref 7–25)
CO2: 31 mmol/L (ref 20–32)
Calcium: 9.8 mg/dL (ref 8.6–10.3)
Chloride: 99 mmol/L (ref 98–110)
Creat: 1.29 mg/dL — ABNORMAL HIGH (ref 0.70–1.18)
GFR, Est African American: 64 mL/min/{1.73_m2} (ref >=60)
GFR, Est Non African American: 55 mL/min/{1.73_m2} — ABNORMAL LOW (ref >=60)
Globulin: 3.5 g/dL (calc) (ref 1.9–3.7)
Glucose, Bld: 177 mg/dL — ABNORMAL HIGH (ref 65–99)
Potassium: 3.7 mmol/L (ref 3.5–5.3)
Sodium: 137 mmol/L (ref 135–146)
Total Bilirubin: 0.3 mg/dL (ref 0.2–1.2)
Total Protein: 7.1 g/dL (ref 6.1–8.1)

## 2018-02-04 LAB — LIPID PANEL
Cholesterol: 149 mg/dL (ref <200)
HDL: 45 mg/dL (ref >40)
LDL Cholesterol (Calc): 87 mg/dL (calc)
Non-HDL Cholesterol (Calc): 104 mg/dL (calc) (ref <130)
Total CHOL/HDL Ratio: 3.3 (calc) (ref <5.0)
Triglycerides: 80 mg/dL (ref <150)

## 2018-02-05 LAB — HEMOGLOBIN A1C
Hgb A1c MFr Bld: 7.8 % of total Hgb — ABNORMAL HIGH (ref <5.7)
Mean Plasma Glucose: 177 (calc)
eAG (mmol/L): 9.8 (calc)

## 2018-02-08 ENCOUNTER — Encounter: Payer: Self-pay | Admitting: Pharmacotherapy

## 2018-02-08 ENCOUNTER — Ambulatory Visit (INDEPENDENT_AMBULATORY_CARE_PROVIDER_SITE_OTHER): Payer: Medicare Other | Admitting: Pharmacotherapy

## 2018-02-08 VITALS — BP 148/90 | HR 82 | Temp 98.3°F | Resp 10 | Ht 69.0 in | Wt 370.0 lb

## 2018-02-08 DIAGNOSIS — Z794 Long term (current) use of insulin: Secondary | ICD-10-CM

## 2018-02-08 DIAGNOSIS — N182 Chronic kidney disease, stage 2 (mild): Secondary | ICD-10-CM

## 2018-02-08 DIAGNOSIS — E1122 Type 2 diabetes mellitus with diabetic chronic kidney disease: Secondary | ICD-10-CM

## 2018-02-08 DIAGNOSIS — I10 Essential (primary) hypertension: Secondary | ICD-10-CM

## 2018-02-08 DIAGNOSIS — N183 Chronic kidney disease, stage 3 unspecified: Secondary | ICD-10-CM

## 2018-02-08 DIAGNOSIS — E0822 Diabetes mellitus due to underlying condition with diabetic chronic kidney disease: Secondary | ICD-10-CM | POA: Diagnosis not present

## 2018-02-08 MED ORDER — INSULIN GLARGINE 100 UNIT/ML SOLOSTAR PEN: 66.0000 [IU] | PEN_INJECTOR | Freq: Every day | SUBCUTANEOUS | 6 refills | Status: DC

## 2018-02-08 NOTE — Progress Notes (Signed)
  Subjective:    Adrian Matthews is a 72 y.o.African American male who presents for follow-up of Type 2 diabetes mellitus.   A1C: 7.8% (was 7.6%) Did not bring blood glucose meter. He admits to BG in the 200's Hypoglycemia x 2 - 46 was lowest BG.  Did not eat much with supper that night. Has not been exercising.  Says the pollen is too much for him and it affects his breathing.  Has been to see allergist.  Taking Flonase and Allegra.  Lantus 60 units daily - fasting BG 140-200 range. Humalog 22 units breakfast, 26 units lunch and supper.  Admits diet choices are poor. Did not take blood pressure medication this morning. Denies problems with vision Denies problems with feet Has peripheral edema Complaining of left knee pain. Nocturia 3-4 times per night No dysuria Staying well hydrated with water.  LDL: 87  Review of Systems A comprehensive review of systems was negative except for: Ears, nose, mouth, throat, and face: positive for nasal congestion Cardiovascular: positive for lower extremity edema Genitourinary: positive for nocturia Musculoskeletal: positive for arthralgias    Objective:    BP (!) 148/90   Pulse 82   Temp 98.3 F (36.8 C) (Oral)   Resp 10   Ht 5' 9"$  (1.753 m)   Wt (!) 370 lb (167.8 kg)   SpO2 98%   BMI 54.64 kg/m   General:  alert, cooperative and no distress  Oropharynx: normal findings: lips normal without lesions and gums healthy   Eyes:  negative findings: lids and lashes normal and conjunctivae and sclerae normal   Ears:  external ears normal        Lung: clear to auscultation bilaterally  Heart:  regular rate and rhythm     Extremities: edema lower extremities  Skin: warm and dry, no hyperpigmentation, vitiligo, or suspicious lesions     Neuro: mental status, speech normal, alert and oriented x3 and gait and station normal   Lab Review Glucose, Bld (mg/dL)  Date Value  02/04/2018 177 (H)  11/06/2017 164 (H)  04/23/2017 115 (H)    CO2 (mmol/L)  Date Value  02/04/2018 31  11/06/2017 31  04/23/2017 27   BUN (mg/dL)  Date Value  02/04/2018 21  11/06/2017 26 (H)  04/23/2017 25  03/12/2016 23  02/26/2016 26  10/19/2015 15   Creat (mg/dL)  Date Value  02/04/2018 1.29 (H)  11/06/2017 1.28 (H)  04/23/2017 1.26 (H)       Assessment:    Diabetes Mellitus type II, under fair control. A1C above goal <7% BP above goal <130/80   Plan:    1.  Rx changes: increase Lantus 66 units daily.  2.  Continue Humalog 22 units breakfast, 26 units lunch and supper. 3.  Counseled on nutrition goals. 4.  Counseled on need for routine exercise.  Goal 30-45 minutes 5 x week. 5.  BP above goal today due to not taking medication. 6.  RTC 3 month

## 2018-02-08 NOTE — Patient Instructions (Signed)
Increase Lantus 66 units daily  Please make sure he has an appointment with Dr Mariea Clonts in the next 3 months

## 2018-02-21 ENCOUNTER — Other Ambulatory Visit: Payer: Self-pay | Admitting: Internal Medicine

## 2018-02-22 MED ORDER — INSULIN PEN NEEDLE 32G X 4 MM MISC: 1 refills | Status: AC

## 2018-02-22 NOTE — Addendum Note (Signed)
Addended by: [name] on: 02/22/2018 10:54 AM   Modules accepted: Orders

## 2018-03-12 ENCOUNTER — Other Ambulatory Visit: Payer: Self-pay | Admitting: *Deleted

## 2018-03-12 DIAGNOSIS — E119 Type 2 diabetes mellitus without complications: Secondary | ICD-10-CM

## 2018-03-12 DIAGNOSIS — Z794 Long term (current) use of insulin: Principal | ICD-10-CM

## 2018-03-12 MED ORDER — INSULIN LISPRO 100 UNIT/ML (KWIKPEN): PEN_INJECTOR | SUBCUTANEOUS | 3 refills | Status: DC

## 2018-03-12 NOTE — Telephone Encounter (Signed)
Patient requested refill Faxed to [facility] 

## 2018-04-14 ENCOUNTER — Other Ambulatory Visit: Payer: Self-pay | Admitting: Internal Medicine

## 2018-04-29 ENCOUNTER — Encounter: Payer: Self-pay | Admitting: Internal Medicine

## 2018-04-29 ENCOUNTER — Ambulatory Visit (INDEPENDENT_AMBULATORY_CARE_PROVIDER_SITE_OTHER): Payer: Medicare Other | Admitting: Internal Medicine

## 2018-04-29 VITALS — BP 140/90 | HR 81 | Temp 98.0°F | Ht 69.0 in | Wt 374.0 lb

## 2018-04-29 DIAGNOSIS — M159 Polyosteoarthritis, unspecified: Secondary | ICD-10-CM | POA: Diagnosis not present

## 2018-04-29 DIAGNOSIS — N183 Chronic kidney disease, stage 3 unspecified: Secondary | ICD-10-CM

## 2018-04-29 DIAGNOSIS — D352 Benign neoplasm of pituitary gland: Secondary | ICD-10-CM

## 2018-04-29 DIAGNOSIS — E1169 Type 2 diabetes mellitus with other specified complication: Secondary | ICD-10-CM

## 2018-04-29 DIAGNOSIS — E229 Hyperfunction of pituitary gland, unspecified: Secondary | ICD-10-CM | POA: Diagnosis not present

## 2018-04-29 DIAGNOSIS — E1122 Type 2 diabetes mellitus with diabetic chronic kidney disease: Secondary | ICD-10-CM

## 2018-04-29 DIAGNOSIS — Z6841 Body Mass Index (BMI) 40.0 and over, adult: Secondary | ICD-10-CM

## 2018-04-29 DIAGNOSIS — Z794 Long term (current) use of insulin: Secondary | ICD-10-CM

## 2018-04-29 DIAGNOSIS — E785 Hyperlipidemia, unspecified: Secondary | ICD-10-CM | POA: Diagnosis not present

## 2018-04-29 NOTE — Progress Notes (Signed)
Location:  [facility] clinic Provider:  Aileen Amore L. Mariea Clonts, D.O., C.M.D.  Code Status: full code Goals of Care:  Advanced Directives 04/29/2018  Does Patient Have a Medical Advance Directive? No  Type of Advance Directive -  Does patient want to make changes to medical advance directive? -  Copy of [place] in Chart? -  Would patient like information on creating a medical advance directive? No - Patient declined  Pre-existing out of facility DNR order (yellow form or pink MOST form) -   Chief Complaint  Patient presents with  . Medical Management of Chronic Issues    28mth follow-up    HPI: Patient is a 72 y.o. male seen today for medical management of chronic diseases.    He is only leaving the house to go to the gym and take the dogs out to play and go to the bathroom.  He needs some new inserts for his shoes.  Also needs new compression hose.  The [employer] ones are not doing the trick--they don't work.      Office Visit from 04/29/2018 in [place]  AUDIT-C Score  0     Wt is going up.  Had labs done on his own and was told that his hormones and insulin will cause some weight gain.  He is going to the gym regularly.  He's otherwise not as active.  He continues to eat well.   Stays away from sweets because they're his weakness, but he eats [place], lettuce, veggies, shrimp, salmon.    BP up, but didn't have his medicine yet.  Past Medical History:  Diagnosis Date  . Asymptomatic varicose veins   . Chronic kidney disease   . Diaphragmatic hernia without mention of obstruction or gangrene   . DM (diabetes mellitus) ([place])   . GERD (gastroesophageal reflux disease)   . Gout, unspecified   . Hypercalcemia   . Hyperlipidemia   . Hyperosmolality and/or hypernatremia   . Hypertension   . Monoclonal paraproteinemia   . New daily persistent headache   . Osteoarthrosis, unspecified whether generalized or localized, lower leg   . Other malaise and fatigue     . Primary localized osteoarthrosis, unspecified site   . Unspecified disorder of kidney and ureter   . Unspecified hereditary and idiopathic peripheral neuropathy     Past Surgical History:  Procedure Laterality Date  . CARPAL TUNNEL RELEASE  2007  . COLONOSCOPY N/A 06/06/2013   Procedure: COLONOSCOPY;  Surgeon: Cleotis Nipper, MD;  Location: [facility] ENDOSCOPY;  Service: Endoscopy;  Laterality: N/A;  . TUMOR REMOVAL  2017   Pituitary adenoma    No Known Allergies  Outpatient Encounter Medications as of 04/29/2018  Medication Sig  . amLODipine (NORVASC) 10 MG tablet TAKE 1 TABLET DAILY FOR BLOOD PRESSURE  . aspirin EC 81 MG tablet Take 1 tablet (81 mg total) by mouth daily.  Marland Kitchen atorvastatin (LIPITOR) 40 MG tablet TAKE 1 TABLET DAILY  . chlorthalidone (HYGROTON) 25 MG tablet TAKE 1 TABLET DAILY FOR BLOOD PRESSURE  . Cholecalciferol (VITAMIN D3) 2000 units TABS Take by mouth daily.  Marland Kitchen FREESTYLE LITE test strip CHECK BLOOD SUGAR THREE TIMES A DAY  . gabapentin (NEURONTIN) 300 MG capsule Take 1 capsule (300 mg total) by mouth 3 (three) times daily.  . Insulin Glargine (LANTUS SOLOSTAR) 100 UNIT/ML Solostar Pen Inject 66 Units into the skin daily.  . insulin lispro (HUMALOG KWIKPEN) 100 UNIT/ML KiwkPen Inject 22 units before breakfast, 26 units  before lunch & supper - plus sliding scale.  . Insulin Pen Needle (SURE COMFORT PEN NEEDLES) 32G X 4 MM MISC Use 4 times daily before meals DX: E11.65  . Iron-FA-B Cmp-C-Biot-Probiotic (FUSION PLUS) CAPS Take 1 capsule by mouth every other day.  Marland Kitchen KLOR-CON M20 20 MEQ tablet TAKE 1 TABLET TWICE A DAY  . losartan (COZAAR) 100 MG tablet TAKE 1 TABLET DAILY  . pantoprazole (PROTONIX) 40 MG tablet TAKE 1 TABLET DAILY   No facility-administered encounter medications on file as of 04/29/2018.     Review of Systems:  Review of Systems  Constitutional: Negative for chills, fever, malaise/fatigue and weight loss.  HENT: Negative for congestion.   Eyes:  Negative for blurred vision.  Respiratory: Negative for cough and shortness of breath.   Cardiovascular: Positive for leg swelling. Negative for chest pain and palpitations.  Gastrointestinal: Negative for abdominal pain, blood in stool, constipation, diarrhea and melena.  Genitourinary: Negative for dysuria.  Musculoskeletal: Positive for joint pain. Negative for falls.  Neurological: Negative for dizziness.  Endo/Heme/Allergies: Does not bruise/bleed easily.  Psychiatric/Behavioral: Negative for memory loss. The patient is not nervous/anxious and does not have insomnia.     Health Maintenance  Topic Date Due  . Hepatitis C Screening  08-May-1946  . FOOT EXAM  04/09/2018  . INFLUENZA VACCINE  05/13/2018  . OPHTHALMOLOGY EXAM  07/02/2018  . HEMOGLOBIN A1C  08/06/2018  . TETANUS/TDAP  04/13/2023  . COLONOSCOPY  06/07/2023  . PNA vac Low Risk Adult  Completed    Physical Exam: Vitals:   04/29/18 0804  BP: 140/80  Pulse: 81  Temp: 98 F (36.7 C)  TempSrc: Oral  SpO2: 97%  Weight: (!) 374 lb (169.6 kg)  Height: 5\' 9"  (1.753 m)   Body mass index is 55.23 kg/m. Physical Exam  Constitutional: He is oriented to person, place, and time. He appears well-developed and well-nourished. No distress.  HENT:  Head: Normocephalic and atraumatic.  Cardiovascular: Normal rate, regular rhythm, normal heart sounds and intact distal pulses.  Pulmonary/Chest: Effort normal and breath sounds normal. No respiratory distress.  Abdominal: Bowel sounds are normal.  Musculoskeletal: Normal range of motion. He exhibits no tenderness.  Neurological: He is alert and oriented to person, place, and time. No cranial nerve deficit.  Skin: Skin is warm and dry. Capillary refill takes less than 2 seconds.  Psychiatric: He has a normal mood and affect. His behavior is normal. Judgment and thought content normal.    Labs reviewed: Basic Metabolic Panel: Recent Labs    11/06/17 0830 02/04/18 0845  NA  137 137  K 3.8 3.7  CL 98 99  CO2 31 31  GLUCOSE 164* 177*  BUN 26* 21  CREATININE 1.28* 1.29*  CALCIUM 9.7 9.8   Liver Function Tests: Recent Labs    11/06/17 0830 02/04/18 0845  AST 16 15  ALT 14 14  BILITOT 0.3 0.3  PROT 7.3 7.1   No results for input(s): LIPASE, AMYLASE in the last 8760 hours. No results for input(s): AMMONIA in the last 8760 hours. CBC: No results for input(s): WBC, NEUTROABS, HGB, HCT, MCV, PLT in the last 8760 hours. Lipid Panel: Recent Labs    02/04/18 0845  CHOL 149  HDL 45  LDLCALC 87  TRIG 80  CHOLHDL 3.3   Lab Results  Component Value Date   HGBA1C 7.8 (H) 02/04/2018    Assessment/Plan 1. Type 2 diabetes mellitus with stage 3 chronic kidney disease, with long-term current use  of insulin ([place]) - has been controlled, but weight trending up with hormonal imbalances, but he has been told by endocrine that he does not need any interventions - Ambulatory referral to Podiatry - CBC with Differential/Platelet; Future - COMPLETE METABOLIC PANEL WITH GFR; Future - Hemoglobin A1c; Future  2. Hyperlipidemia associated with type 2 diabetes mellitus ([place]) - f/u lab before next visit - Lipid panel; Future  3. Class 3 severe obesity with serious comorbidity and body mass index (BMI) of 50.0 to 59.9 in adult, unspecified obesity type ([place]) -ongoing, weight trended up, is exercising and reports eating right  4. Pituitary microadenoma with hyperprolactinemia ([place]) -cont to monitor, refused to go back to [facility] in follow up due to drive  5. Generalized osteoarthritis of multiple sites -cont tylenol if needed, exercise program at gym  Labs/tests ordered:   Orders Placed This Encounter  Procedures  . CBC with Differential/Platelet    Standing Status:   Future    Standing Expiration Date:   12/29/2018  . COMPLETE METABOLIC PANEL WITH GFR    Standing Status:   Future    Standing Expiration Date:   12/29/2018  . Hemoglobin A1c    Standing Status:    Future    Standing Expiration Date:   12/29/2018  . Lipid panel    Standing Status:   Future    Standing Expiration Date:   12/29/2018  . Ambulatory referral to Podiatry    Referral Priority:   Routine    Referral Type:   Consultation    Referral Reason:   Specialty Services Required    Requested Specialty:   Podiatry    Number of Visits Requested:   1   Next appt:  4 mos med mgt, labs before  [place]. Nyala Kirchner, D.O. [facility] [address], [place] [address] Cell Phone (Mon-Fri 8am-5pm):  [phone] On Call:  [phone] & follow prompts after 5pm & weekends Office Phone:  [phone] Office Fax:  [phone]

## 2018-05-07 ENCOUNTER — Other Ambulatory Visit: Payer: Self-pay | Admitting: Internal Medicine

## 2018-05-07 DIAGNOSIS — E876 Hypokalemia: Secondary | ICD-10-CM

## 2018-05-14 ENCOUNTER — Encounter: Payer: Self-pay | Admitting: Podiatry

## 2018-05-14 ENCOUNTER — Ambulatory Visit (INDEPENDENT_AMBULATORY_CARE_PROVIDER_SITE_OTHER): Payer: Medicare Other | Admitting: Podiatry

## 2018-05-14 DIAGNOSIS — M79674 Pain in right toe(s): Secondary | ICD-10-CM | POA: Diagnosis not present

## 2018-05-14 DIAGNOSIS — M79675 Pain in left toe(s): Secondary | ICD-10-CM

## 2018-05-14 DIAGNOSIS — M2142 Flat foot [pes planus] (acquired), left foot: Secondary | ICD-10-CM | POA: Diagnosis not present

## 2018-05-14 DIAGNOSIS — M2141 Flat foot [pes planus] (acquired), right foot: Secondary | ICD-10-CM | POA: Diagnosis not present

## 2018-05-14 DIAGNOSIS — E1142 Type 2 diabetes mellitus with diabetic polyneuropathy: Secondary | ICD-10-CM | POA: Diagnosis not present

## 2018-05-14 DIAGNOSIS — B351 Tinea unguium: Secondary | ICD-10-CM | POA: Diagnosis not present

## 2018-05-16 NOTE — Progress Notes (Signed)
Subjective: Adrian Matthews  Is a 72 y.o. AAM who presents today with diabetes and cc of painful, discolored, thick toenails which interfere with daily activities and routine tasks.  Pain is aggravated when wearing enclosed shoe gear. Pain has been present for the past few weeks. He relates visiting the clinic a few years ago.   Medical History   Date Unknown Asymptomatic varicose veins  Date Unknown Chronic kidney disease  Date Unknown Diaphragmatic hernia without mention of obstruction or gangrene  Date Unknown DM (diabetes mellitus) ([place])  Date Unknown GERD (gastroesophageal reflux disease)  Date Unknown Gout, unspecified  Date Unknown Hypercalcemia  Date Unknown Hyperlipidemia  Date Unknown Hyperosmolality and/or hypernatremia  Date Unknown Hypertension  Date Unknown Monoclonal paraproteinemia  Date Unknown New daily persistent headache  Date Unknown Osteoarthrosis, unspecified whether generalized or localized, lower leg  Date Unknown Other malaise and fatigue  Date Unknown Primary localized osteoarthrosis, unspecified site  Date Unknown Unspecified disorder of kidney and ureter  Date Unknown Unspecified hereditary and idiopathic peripheral neuropathy   Problem List   New problems from outside sources are available for reconciliation  Cardiovascular and Mediastinum  Essential hypertension   Digestive  Rectal bleeding   Endocrine  Type II or unspecified type diabetes mellitus without mention of complication, uncontrolled   Hyperlipidemia associated with type 2 diabetes mellitus ([place])   Pituitary microadenoma with hyperprolactinemia (H[facility])   Musculoskeletal and Integument  Osteoarthrosis, unspecified whether generalized or localized, lower leg   Arthritis   Genitourinary  Unspecified disorder of kidney and ureter   Chronic kidney disease (CKD), stage II (mild)   Chronic kidney disease, stage 3 (HCC)   Other  Monoclonal paraproteinemia   Gout, unspecified   Class 3 severe  obesity with serious comorbidity and body mass index (BMI) of 50.0 to 59.9 in adult [facility])   Morning headache   Hormone deficiency   Testosterone deficiency in male    Surgical History    2017 Tumor removal   06/06/2013 Colonoscopy (N/A)   2007 Carpal tunnel release   Medications    amLODipine (NORVASC) 10 MG tablet    aspirin EC 81 MG tablet    atorvastatin (LIPITOR) 40 MG tablet    chlorthalidone (HYGROTON) 25 MG tablet    Cholecalciferol (VITAMIN D3) 2000 units TABS    FREESTYLE LITE test strip    Insulin Glargine (LANTUS SOLOSTAR) 100 UNIT/ML Solostar Pen    insulin lispro (HUMALOG KWIKPEN) 100 UNIT/ML KiwkPen    Insulin Pen Needle (SURE COMFORT PEN NEEDLES) 32G X 4 MM MISC    Iron-FA-B Cmp-C-Biot-Probiotic (FUSION PLUS) CAPS    KLOR-CON M20 20 MEQ tablet    losartan (COZAAR) 100 MG tablet    pantoprazole (PROTONIX) 40 MG tablet    gabapentin (NEURONTIN) 300 MG capsule    Allergies      No Known Allergies   Tobacco History   Smoking Status  Never Smoker  Smokeless Tobacco Status  Never Used   Family History   Sister Hypertension         Brother Hypertension    Diabetes         Brother Hypertension         Sister Hypertension         Mother (Deceased)         Father (Deceased)            Neg Hx Migraines    Immunizations/Injections   Influenza, High Dose Seasonal PF9/27/2018, 07/25/2016   Influenza,inj,Quad PF,6+ Mos9/22/2014  Influenza-Unspecified10/15/2016, 07/13/2014   Pneumococcal Conjugate-1311/02/2014   Pneumococcal Polysaccharide-2310/27/2014   Tdap7/10/2012   Zoster10/27/2014    ROS: Per HPI unless specifically indicated in ROS section    Objective: 72 y.o. AAM, morbidly obese in NAD. AAO x 3.  Vascular Examination: Capillary refill time <3 seconds x 10 digits Dorsalis pedis and posterior tibial pulses present b/l No digital hair x 10 digits Skin temperature warm to warm b/l  Dermatological Examination: Skin with normal turgor,  texture and tone b/l  Toenails 1-5 b/l discolored, thick, dystrophic with subungual debris and pain with palpation to nailbeds due to thickness of nails.  Musculoskeletal: Muscle strength 5/5 to all LE muscle groups Collapse of medial longitudinal arch b/l  Neurological: Sensation diminished with 10 gram monofilament. 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 Toenails 1-5 b/l were debrided in length and girth without iatrogenic bleeding. 2. Discussed uncontrolled diabetes and potential complications with elevated A1Cs with Mr. Gagen. Encouraged him to try to get A1C below 6. He related understanding. He is swimming at the [employer] a few times a week. Also participating in water aerobics. 3. Mr. Prasad qualifies for diabetic shoes with the following diagnoses: NIDDM with neuropathy; pes planus foot deformity b/l. Paperwork completed. To be scheduled with Pedorthist for evaluation and measurements. 4. Patient to continue soft, supportive shoe gear 5. Patient to report any pedal injuries to medical professional immediately. 6. Follow up 3 months.  7. Patient to call should there be a concern in the interim.

## 2018-05-18 ENCOUNTER — Ambulatory Visit: Payer: Medicare Other | Admitting: Orthotics

## 2018-05-18 DIAGNOSIS — M2142 Flat foot [pes planus] (acquired), left foot: Secondary | ICD-10-CM

## 2018-05-18 DIAGNOSIS — E1142 Type 2 diabetes mellitus with diabetic polyneuropathy: Secondary | ICD-10-CM

## 2018-05-18 DIAGNOSIS — M2141 Flat foot [pes planus] (acquired), right foot: Secondary | ICD-10-CM

## 2018-05-18 NOTE — Progress Notes (Signed)
Adrian Matthews came in today for fitting management diabetic shoes.  Adrian Matthews is a formal Adrian Matthews of trial foot and ankle.  Adrian Matthews shows X80 1 AM X 12 as a shoe.  Adrian Matthews measured 12 extra wide.  Adrian Matthews's diabetic doctor is Hollace Kinnier.  Adrian Matthews's D.P.M. is Dr. Adah Perl.  Adrian Matthews presents today with pes planus foot as well as DM 2

## 2018-05-27 ENCOUNTER — Other Ambulatory Visit: Payer: Self-pay | Admitting: Internal Medicine

## 2018-06-16 ENCOUNTER — Ambulatory Visit: Payer: Medicare Other | Admitting: Orthotics

## 2018-06-16 DIAGNOSIS — E1142 Type 2 diabetes mellitus with diabetic polyneuropathy: Secondary | ICD-10-CM

## 2018-06-16 DIAGNOSIS — M2142 Flat foot [pes planus] (acquired), left foot: Secondary | ICD-10-CM

## 2018-06-16 DIAGNOSIS — M2141 Flat foot [pes planus] (acquired), right foot: Secondary | ICD-10-CM

## 2018-06-18 NOTE — Progress Notes (Signed)
Patient had appointment today for definitive and final diabetic shoe fitting and delivery.  Patient was seen by Betha, C.Ped, [facility].   The inserts fit well and accomplished full contact with the plantar surface of the foot bilateral; the shoes fit well and offered forefoot freedom, no noticible heel slippage, and good toe clearance w/ the insert in place.  Patient was advised to monitor of any skin irritation, breakdown.  Patient was satisfied with fit and function. 

## 2018-07-12 DIAGNOSIS — I129 Hypertensive chronic kidney disease with stage 1 through stage 4 chronic kidney disease, or unspecified chronic kidney disease: Secondary | ICD-10-CM | POA: Diagnosis not present

## 2018-07-12 DIAGNOSIS — Z8639 Personal history of other endocrine, nutritional and metabolic disease: Secondary | ICD-10-CM | POA: Diagnosis not present

## 2018-07-12 DIAGNOSIS — E893 Postprocedural hypopituitarism: Secondary | ICD-10-CM | POA: Diagnosis not present

## 2018-07-12 DIAGNOSIS — D631 Anemia in chronic kidney disease: Secondary | ICD-10-CM | POA: Diagnosis not present

## 2018-07-12 DIAGNOSIS — N189 Chronic kidney disease, unspecified: Secondary | ICD-10-CM | POA: Diagnosis not present

## 2018-07-12 DIAGNOSIS — N183 Chronic kidney disease, stage 3 (moderate): Secondary | ICD-10-CM | POA: Diagnosis not present

## 2018-07-14 DIAGNOSIS — E119 Type 2 diabetes mellitus without complications: Secondary | ICD-10-CM | POA: Diagnosis not present

## 2018-07-14 LAB — HM DIABETES EYE EXAM

## 2018-07-27 ENCOUNTER — Encounter: Payer: Self-pay | Admitting: *Deleted

## 2018-08-03 DIAGNOSIS — N2581 Secondary hyperparathyroidism of renal origin: Secondary | ICD-10-CM | POA: Insufficient documentation

## 2018-08-13 ENCOUNTER — Ambulatory Visit (INDEPENDENT_AMBULATORY_CARE_PROVIDER_SITE_OTHER): Payer: Medicare Other | Admitting: Podiatry

## 2018-08-13 ENCOUNTER — Encounter: Payer: Self-pay | Admitting: Podiatry

## 2018-08-13 DIAGNOSIS — M79675 Pain in left toe(s): Secondary | ICD-10-CM

## 2018-08-13 DIAGNOSIS — B351 Tinea unguium: Secondary | ICD-10-CM | POA: Diagnosis not present

## 2018-08-13 DIAGNOSIS — M79674 Pain in right toe(s): Secondary | ICD-10-CM

## 2018-08-13 DIAGNOSIS — E1142 Type 2 diabetes mellitus with diabetic polyneuropathy: Secondary | ICD-10-CM

## 2018-08-13 NOTE — Progress Notes (Signed)
Subjective: Adrian Matthews presents to clinic today with history of diabetic neuropathy with cc of painful, mycotic toenails.  Pain is aggravated when wearing enclosed shoe gear and relieved with periodic professional debridement.  He states he feels he needs to come in sooner due to pain he is experiencing in his shoes with length of toenails.  Patient has peripheral neuropathy managed with gabapentin and he notes no increasing symptoms.  Objective:  Vascular Examination: Capillary refill time less than 3 sec x 10 digits Dorsalis pedis and Posterior tibial pulses present Digital hair x 10 digits was absent Skin temperature gradient WNL b/l  Dermatological Examination: No open wounds or interdigital maceration Skin with normal turgor, texture and tone b/l Toenails 1-5 b/l  Moderately elongated, discolored, thick, dystrophic with subungual debris and pain with palpation to nailbeds due to thickness of nails.  Musculoskeletal: Muscle strength 5/5 b/l to all muscle groups Collapse of medial arch consistent with pes planus foot type b/l  Neurological: Sensation with 10 gram monofilament. Vibratory sensation   Assessment: 1. Painful onychomycosis toenails 1-5 b/l 2. Peripheral neuropathy  Plan: 1. Toenails 1-5 b/l were debrided in length and girth without iatrogenic bleeding. 2. Patient to continue soft, supportive shoe gear 3. Patient to report any pedal injuries to medical professional  4. Follow up 10 weeks. Patient/POA to call should there be a concern in the interim.

## 2018-08-13 NOTE — Patient Instructions (Signed)

## 2018-08-25 ENCOUNTER — Other Ambulatory Visit: Payer: Self-pay | Admitting: Internal Medicine

## 2018-08-26 ENCOUNTER — Ambulatory Visit (INDEPENDENT_AMBULATORY_CARE_PROVIDER_SITE_OTHER): Payer: Medicare Other

## 2018-08-26 ENCOUNTER — Other Ambulatory Visit: Payer: Medicare Other

## 2018-08-26 DIAGNOSIS — E785 Hyperlipidemia, unspecified: Principal | ICD-10-CM

## 2018-08-26 DIAGNOSIS — Z794 Long term (current) use of insulin: Secondary | ICD-10-CM

## 2018-08-26 DIAGNOSIS — E1169 Type 2 diabetes mellitus with other specified complication: Secondary | ICD-10-CM | POA: Diagnosis not present

## 2018-08-26 DIAGNOSIS — E1122 Type 2 diabetes mellitus with diabetic chronic kidney disease: Secondary | ICD-10-CM

## 2018-08-26 DIAGNOSIS — N183 Chronic kidney disease, stage 3 unspecified: Secondary | ICD-10-CM

## 2018-08-26 DIAGNOSIS — Z23 Encounter for immunization: Secondary | ICD-10-CM

## 2018-08-27 LAB — CBC WITH DIFFERENTIAL/PLATELET
Basophils Absolute: 30 cells/uL (ref 0–200)
Basophils Relative: 0.3 %
Eosinophils Absolute: 139 cells/uL (ref 15–500)
Eosinophils Relative: 1.4 %
HCT: 39.3 % (ref 38.5–50.0)
Hemoglobin: 12.4 g/dL — ABNORMAL LOW (ref 13.2–17.1)
Lymphs Abs: 4148 cells/uL — ABNORMAL HIGH (ref 850–3900)
MCH: 25.2 pg — ABNORMAL LOW (ref 27.0–33.0)
MCHC: 31.6 g/dL — ABNORMAL LOW (ref 32.0–36.0)
MCV: 79.7 fL — ABNORMAL LOW (ref 80.0–100.0)
MPV: 10.1 fL (ref 7.5–12.5)
Monocytes Relative: 3.9 %
Neutro Abs: 5198 cells/uL (ref 1500–7800)
Neutrophils Relative %: 52.5 %
Platelets: 218 10*3/uL (ref 140–400)
RBC: 4.93 10*6/uL (ref 4.20–5.80)
RDW: 16.4 % — ABNORMAL HIGH (ref 11.0–15.0)
Total Lymphocyte: 41.9 %
WBC mixed population: 386 cells/uL (ref 200–950)
WBC: 9.9 10*3/uL (ref 3.8–10.8)

## 2018-08-27 LAB — COMPLETE METABOLIC PANEL WITH GFR
AG Ratio: 0.9 (calc) — ABNORMAL LOW (ref 1.0–2.5)
ALT: 15 U/L (ref 9–46)
AST: 27 U/L (ref 10–35)
Albumin: 3.7 g/dL (ref 3.6–5.1)
Alkaline phosphatase (APISO): 74 U/L (ref 40–115)
BUN/Creatinine Ratio: 15 (calc) (ref 6–22)
BUN: 20 mg/dL (ref 7–25)
CO2: 28 mmol/L (ref 20–32)
Calcium: 9.9 mg/dL (ref 8.6–10.3)
Chloride: 99 mmol/L (ref 98–110)
Creat: 1.35 mg/dL — ABNORMAL HIGH (ref 0.70–1.18)
GFR, Est African American: 61 mL/min/{1.73_m2} (ref >=60)
GFR, Est Non African American: 52 mL/min/{1.73_m2} — ABNORMAL LOW (ref >=60)
Globulin: 3.9 g/dL (calc) — ABNORMAL HIGH (ref 1.9–3.7)
Glucose, Bld: 128 mg/dL — ABNORMAL HIGH (ref 65–99)
Potassium: 4.3 mmol/L (ref 3.5–5.3)
Sodium: 139 mmol/L (ref 135–146)
Total Bilirubin: 0.4 mg/dL (ref 0.2–1.2)
Total Protein: 7.6 g/dL (ref 6.1–8.1)

## 2018-08-27 LAB — LIPID PANEL
Cholesterol: 166 mg/dL (ref <200)
HDL: 44 mg/dL (ref >40)
LDL Cholesterol (Calc): 103 mg/dL (calc) — ABNORMAL HIGH
Non-HDL Cholesterol (Calc): 122 mg/dL (calc) (ref <130)
Total CHOL/HDL Ratio: 3.8 (calc) (ref <5.0)
Triglycerides: 92 mg/dL (ref <150)

## 2018-08-27 LAB — HEMOGLOBIN A1C
Hgb A1c MFr Bld: 7.4 % of total Hgb — ABNORMAL HIGH (ref <5.7)
Mean Plasma Glucose: 166 (calc)
eAG (mmol/L): 9.2 (calc)

## 2018-08-30 ENCOUNTER — Encounter: Payer: Self-pay | Admitting: Internal Medicine

## 2018-08-30 ENCOUNTER — Ambulatory Visit (INDEPENDENT_AMBULATORY_CARE_PROVIDER_SITE_OTHER): Payer: Medicare Other | Admitting: Internal Medicine

## 2018-08-30 VITALS — BP 138/80 | HR 83 | Temp 97.7°F | Ht 69.0 in | Wt 378.8 lb

## 2018-08-30 DIAGNOSIS — E1169 Type 2 diabetes mellitus with other specified complication: Secondary | ICD-10-CM | POA: Diagnosis not present

## 2018-08-30 DIAGNOSIS — N183 Chronic kidney disease, stage 3 unspecified: Secondary | ICD-10-CM

## 2018-08-30 DIAGNOSIS — E1122 Type 2 diabetes mellitus with diabetic chronic kidney disease: Secondary | ICD-10-CM | POA: Diagnosis not present

## 2018-08-30 DIAGNOSIS — Z794 Long term (current) use of insulin: Secondary | ICD-10-CM

## 2018-08-30 DIAGNOSIS — E785 Hyperlipidemia, unspecified: Secondary | ICD-10-CM | POA: Diagnosis not present

## 2018-08-30 DIAGNOSIS — Z6841 Body Mass Index (BMI) 40.0 and over, adult: Secondary | ICD-10-CM

## 2018-08-30 DIAGNOSIS — E229 Hyperfunction of pituitary gland, unspecified: Secondary | ICD-10-CM | POA: Diagnosis not present

## 2018-08-30 DIAGNOSIS — D509 Iron deficiency anemia, unspecified: Secondary | ICD-10-CM | POA: Diagnosis not present

## 2018-08-30 DIAGNOSIS — M159 Polyosteoarthritis, unspecified: Secondary | ICD-10-CM | POA: Insufficient documentation

## 2018-08-30 DIAGNOSIS — D352 Benign neoplasm of pituitary gland: Secondary | ICD-10-CM

## 2018-08-30 NOTE — Patient Instructions (Signed)
Fat and Cholesterol Restricted Diet High levels of fat and cholesterol in your blood may lead to various health problems, such as diseases of the heart, blood vessels, gallbladder, liver, and pancreas. Fats are concentrated sources of energy that come in various forms. Certain types of fat, including saturated fat, may be harmful in excess. Cholesterol is a substance needed by your body in small amounts. Your body makes all the cholesterol it needs. Excess cholesterol comes from the food you eat. When you have high levels of cholesterol and saturated fat in your blood, health problems can develop because the excess fat and cholesterol will gather along the walls of your blood vessels, causing them to narrow. Choosing the right foods will help you control your intake of fat and cholesterol. This will help keep the levels of these substances in your blood within normal limits and reduce your risk of disease. What is my plan? Your health care provider recommends that you:  Limit your fat intake   Limit the amount of cholesterol in your diet   Eat 20-30 grams of fiber each day.  What types of fat should I choose?  Choose healthy fats more often. Choose monounsaturated and polyunsaturated fats, such as olive and canola oil, flaxseeds, walnuts, almonds, and seeds.  Eat more omega-3 fats. Good choices include salmon, mackerel, sardines, tuna, flaxseed oil, and ground flaxseeds. Aim to eat fish at least two times a week.  Limit saturated fats. Saturated fats are primarily found in animal products, such as meats, butter, and cream. Plant sources of saturated fats include palm oil, palm kernel oil, and coconut oil.  Avoid foods with partially hydrogenated oils in them. These contain trans fats. Examples of foods that contain trans fats are stick margarine, some tub margarines, cookies, crackers, and other baked goods. What general guidelines do I need to follow? These guidelines for healthy eating will  help you control your intake of fat and cholesterol:  Check food labels carefully to identify foods with trans fats or high amounts of saturated fat.  Fill one half of your plate with vegetables and green salads.  Fill one fourth of your plate with whole grains. Look for the word "whole" as the first word in the ingredient list.  Fill one fourth of your plate with lean protein foods.  Limit fruit to two servings a day. Choose fruit instead of juice.  Eat more foods that contain fiber, such as apples, broccoli, carrots, beans, peas, and barley.  Eat more home-cooked food and less restaurant, buffet, and fast food.  Limit or avoid alcohol.  Limit foods high in starch and sugar.  Limit fried foods.  Cook foods using methods other than frying. Baking, boiling, grilling, and broiling are all great options.  Lose weight if you are overweight. Losing just 5-10% of your initial body weight can help your overall health and prevent diseases such as diabetes and heart disease.  What foods can I eat? Grains  Whole grains, such as whole wheat or whole grain breads, crackers, cereals, and pasta. Unsweetened oatmeal, bulgur, barley, quinoa, or brown rice. Corn or whole wheat flour tortillas. Vegetables  Fresh or frozen vegetables (raw, steamed, roasted, or grilled). Green salads. Fruits  All fresh, canned (in natural juice), or frozen fruits. Meats and other protein foods  Ground beef (85% or leaner), grass-fed beef, or beef trimmed of fat. Skinless chicken or [place]. Ground chicken or [place]. Pork trimmed of fat. All fish and seafood. Eggs. Dried beans, peas, or lentils.  Unsalted nuts or seeds. Unsalted canned or dry beans. Dairy  Low-fat dairy products, such as skim or 1% milk, 2% or reduced-fat cheeses, low-fat ricotta or cottage cheese, or plain low-fat yo Fats and oils  Tub margarines without trans fats. Light or reduced-fat mayonnaise and salad dressings. Avocado. Olive, canola,  sesame, or safflower oils. Natural peanut or almond butter (choose ones without added sugar and oil). The items listed above may not be a complete list of recommended foods or beverages. Contact your dietitian for more options. Foods to avoid Grains  White bread. White pasta. White rice. Cornbread. Bagels, pastries, and croissants. Crackers that contain trans fat. Vegetables  White potatoes. Corn. Creamed or fried vegetables. Vegetables in a cheese sauce. Fruits  Dried fruits. Canned fruit in light or heavy syrup. Fruit juice. Meats and other protein foods  Fatty cuts of meat. Ribs, chicken wings, bacon, sausage, bologna, salami, chitterlings, fatback, hot dogs, bratwurst, and packaged luncheon meats. Liver and organ meats. Dairy  Whole or 2% milk, cream, half-and-half, and cream cheese. Whole milk cheeses. Whole-fat or sweetened yogurt. Full-fat cheeses. Nondairy creamers and whipped toppings. Processed cheese, cheese spreads, or cheese curds. Beverages  Alcohol. Sweetened drinks (such as sodas, lemonade, and fruit drinks or punches). Fats and oils  Butter, stick margarine, lard, shortening, ghee, or bacon fat. Coconut, palm kernel, or palm oils. Sweets and desserts  Corn syrup, sugars, honey, and molasses. Candy. Jam and jelly. Syrup. Sweetened cereals. Cookies, pies, cakes, donuts, muffins, and ice cream. The items listed above may not be a complete list of foods and beverages to avoid. Contact your dietitian for more information. This information is not intended to replace advice given to you by your health care provider. Make sure you discuss any questions you have with your health care provider. Document Released: 09/29/2005 Document Revised: 10/20/2014 Document Reviewed: 12/28/2013 Elsevier Interactive Patient Education  [employer].

## 2018-08-30 NOTE — Progress Notes (Signed)
Location:  [facility] clinic Provider:  Shristi Scheib L. Mariea Clonts, D.O., C.M.D.  Code Status: Full code Goals of Care:  Advanced Directives 08/30/2018  Does Patient Have a Medical Advance Directive? No  Type of Advance Directive -  Does patient want to make changes to medical advance directive? -  Copy of [place] in Chart? -  Would patient like information on creating a medical advance directive? No - Patient declined  Pre-existing out of facility DNR order (yellow form or pink MOST form) -     Chief Complaint  Patient presents with  . Medical Management of Chronic Issues    4 month follow up    HPI: Patient is a 72 y.o. male seen today for medical management of chronic diseases/ 4 month f/u.    LDL has trended up from 87 to 103.  He knows why--he was tired of bland food.  He's added cheese fondue and cheese quesadillas to his menu.  Has had a little more red meat than usual--grilled steak from [employer].  He's tired of chicken.  It's not a habit, just wants something different.  Does not want to take more lipitor.  He may also be overdoing his vinaigrette dressing--uses 1/2pack of chic fil a packet.  Only uses 1/2 pack of the zaxby's also.  hba1c 7.4 down from 7.8.  Still not frying anything--bakes/boils/steams.  Has not been cooking in the winter.  He did yesterday for the week.  Eats normal serving size of foods also by his report.  Has some red snapper--may cook those.    Creatinine up just slightly vs last visit.  Has CKD.    hgb 12.4/39.3 mild anemia.    BP elevated today at arrival to 142/80.    Weight up 4.8 more lbs.  He's been trending up from 294 lbs in May of 2017.  BMI is now 55.9.  They have been adjusting his hormones at endocrine.  Says he no longer has hair on his arms and legs.    He's taking straight iron pills now.  [employer] won't pay for his fusion plus anymore.    Feels good.  Does about the same exercise 3-4 times per week.    Arthritis pain has  gotten worse.  Makes it around the track 5 times and he's about done.  If he goes to the gym and then does other chores etc, then he's got to go home under the heat blanket.    Past Medical History:  Diagnosis Date  . Asymptomatic varicose veins   . Chronic kidney disease   . Diaphragmatic hernia without mention of obstruction or gangrene   . DM (diabetes mellitus) ([place])   . GERD (gastroesophageal reflux disease)   . Gout, unspecified   . Hypercalcemia   . Hyperlipidemia   . Hyperosmolality and/or hypernatremia   . Hypertension   . Monoclonal paraproteinemia   . New daily persistent headache   . Osteoarthrosis, unspecified whether generalized or localized, lower leg   . Other malaise and fatigue   . Primary localized osteoarthrosis, unspecified site   . Unspecified disorder of kidney and ureter   . Unspecified hereditary and idiopathic peripheral neuropathy     Past Surgical History:  Procedure Laterality Date  . CARPAL TUNNEL RELEASE  2007  . COLONOSCOPY N/A 06/06/2013   Procedure: COLONOSCOPY;  Surgeon: Cleotis Nipper, MD;  Location: [facility] ENDOSCOPY;  Service: Endoscopy;  Laterality: N/A;  . TUMOR REMOVAL  2017   Pituitary adenoma  No Known Allergies  Outpatient Encounter Medications as of 08/30/2018  Medication Sig  . amLODipine (NORVASC) 10 MG tablet TAKE 1 TABLET DAILY FOR BLOOD PRESSURE  . aspirin EC 81 MG tablet Take 1 tablet (81 mg total) by mouth daily.  Marland Kitchen atorvastatin (LIPITOR) 40 MG tablet TAKE 1 TABLET DAILY  . chlorthalidone (HYGROTON) 25 MG tablet TAKE 1 TABLET DAILY FOR BLOOD PRESSURE  . Cholecalciferol (VITAMIN D3) 2000 units TABS Take by mouth daily.  Marland Kitchen FREESTYLE LITE test strip CHECK BLOOD SUGAR THREE TIMES A DAY  . gabapentin (NEURONTIN) 300 MG capsule Take 1 capsule (300 mg total) by mouth 3 (three) times daily.  . Insulin Glargine (LANTUS SOLOSTAR) 100 UNIT/ML Solostar Pen Inject 66 Units into the skin daily.  . insulin lispro (HUMALOG KWIKPEN) 100  UNIT/ML KiwkPen Inject 22 units before breakfast, 26 units before lunch & supper - plus sliding scale.  . Insulin Pen Needle (SURE COMFORT PEN NEEDLES) 32G X 4 MM MISC Use 4 times daily before meals DX: E11.65  . KLOR-CON M20 20 MEQ tablet TAKE 1 TABLET TWICE A DAY  . losartan (COZAAR) 100 MG tablet TAKE 1 TABLET DAILY  . Omega-3 Fatty Acids (FISH OIL) 1200 MG CAPS Take by mouth.  . pantoprazole (PROTONIX) 40 MG tablet TAKE 1 TABLET DAILY  . sodium chloride (OCEAN) 0.65 % nasal spray 1 spray ea nare every 1 hour while awake for 2 weeks then 1 spray ea nare every 3-4 hrs for 3 months  . [DISCONTINUED] Iron-FA-B Cmp-C-Biot-Probiotic (FUSION PLUS) CAPS Take 1 capsule by mouth every other day.   No facility-administered encounter medications on file as of 08/30/2018.     Review of Systems:  Review of Systems  Constitutional: Negative for chills, fever and malaise/fatigue.  HENT: Negative for congestion and hearing loss.   Eyes: Negative for blurred vision.  Respiratory: Negative for cough and shortness of breath.   Cardiovascular: Positive for leg swelling. Negative for chest pain and palpitations.       Wears his compression hose   Gastrointestinal: Negative for abdominal pain, blood in stool, constipation, diarrhea and melena.  Genitourinary: Negative for dysuria.  Musculoskeletal: Positive for joint pain.       Knees and hands  Skin: Negative for itching and rash.  Neurological: Positive for tingling and sensory change. Negative for dizziness and loss of consciousness.  Endo/Heme/Allergies:       Diabetes  Psychiatric/Behavioral: Negative for depression and memory loss. The patient is not nervous/anxious and does not have insomnia.     Health Maintenance  Topic Date Due  . Hepatitis C Screening  [birth year]  . FOOT EXAM  04/09/2018  . HEMOGLOBIN A1C  02/24/2019  . OPHTHALMOLOGY EXAM  07/15/2019  . TETANUS/TDAP  04/13/2023  . COLONOSCOPY  06/07/2023  . INFLUENZA VACCINE  Completed   . PNA vac Low Risk Adult  Completed    Physical Exam: Vitals:   08/30/18 0830  BP: (!) 142/80  Pulse: 83  Temp: 97.7 F (36.5 C)  TempSrc: Oral  SpO2: 97%  Weight: (!) 378 lb 12.8 oz (171.8 kg)  Height: 5\' 9"  (1.753 m)   Body mass index is 55.94 kg/m. Physical Exam  Constitutional: He is oriented to person, place, and time. He appears well-developed and well-nourished. No distress.  Morbidly obese  HENT:  Head: Normocephalic and atraumatic.  Cardiovascular: Normal rate, regular rhythm, normal heart sounds and intact distal pulses.  Pulmonary/Chest: Effort normal and breath sounds normal. No respiratory distress.  Abdominal:  Bowel sounds are normal.  Musculoskeletal: Normal range of motion. He exhibits no tenderness.  Neurological: He is alert and oriented to person, place, and time.  Skin: Skin is warm and dry.  Psychiatric: He has a normal mood and affect. His behavior is normal. Judgment and thought content normal.    Labs reviewed: Basic Metabolic Panel: Recent Labs    11/06/17 0830 02/04/18 0845 08/26/18 0806  NA 137 137 139  K 3.8 3.7 4.3  CL 98 99 99  CO2 31 31 28   GLUCOSE 164* 177* 128*  BUN 26* 21 20  CREATININE 1.28* 1.29* 1.35*  CALCIUM 9.7 9.8 9.9   Liver Function Tests: Recent Labs    11/06/17 0830 02/04/18 0845 08/26/18 0806  AST 16 15 27   ALT 14 14 15   BILITOT 0.3 0.3 0.4  PROT 7.3 7.1 7.6   No results for input(s): LIPASE, AMYLASE in the last 8760 hours. No results for input(s): AMMONIA in the last 8760 hours. CBC: Recent Labs    08/26/18 0806  WBC 9.9  NEUTROABS 5,198  HGB 12.4*  HCT 39.3  MCV 79.7*  PLT 218   Lipid Panel: Recent Labs    02/04/18 0845 08/26/18 0806  CHOL 149 166  HDL 45 44  LDLCALC 87 103*  TRIG 80 92  CHOLHDL 3.3 3.8   Lab Results  Component Value Date   HGBA1C 7.4 (H) 08/26/2018   Assessment/Plan 1. Type 2 diabetes mellitus with stage 3 chronic kidney disease, with long-term current use of  insulin (HCC) - well controlled, but hba1c trended up--clear why from dietary changes, cont asa, lipitor, lantus and humalog meal coverage - CBC with Differential/Platelet; Future - Hemoglobin A1c; Future  2. Hyperlipidemia associated with type 2 diabetes mellitus ([place]) -above goal, due to addition of cheese and small amounts of red meat to his diet which he'd been avoiding--will return to prior dietary choices to reduce this -refuses increase in lipitor at this time  - Lipid panel; Future  3. Class 3 severe obesity with serious comorbidity and body mass index (BMI) of 50.0 to 59.9 in adult, unspecified obesity type ([place]) -morbid obesity with weight trending up--seems hormonally induced and some poor diet choices lately - CBC with Differential/Platelet; Future - Lipid panel; Future - Basic metabolic panel; Future - Hemoglobin A1c; Future  4. Pituitary microadenoma with hyperprolactinemia ([place]) -hormones being adjusted by endocrine and pt's weight keeps going up anyway--Dr. Almyra Free  5. Chronic kidney disease, stage 3 (HCC) - fairly stable, follows with Dr Posey Pronto with [facility] - Basic metabolic panel; Future  6. Iron deficiency anemia, unspecified iron deficiency anemia type - is taking otc iron -CBC with Differential/Platelet; Future  7. Generalized osteoarthritis of multiple sites -of knees, hands especially, not using medication, manages with exercise and heating blanket  Labs/tests ordered:   Orders Placed This Encounter  Procedures  . CBC with Differential/Platelet    Standing Status:   Future    Standing Expiration Date:   08/31/2019  . Lipid panel    Standing Status:   Future    Standing Expiration Date:   08/31/2019    Order Specific Question:   Has the patient fasted?    Answer:   Yes  . Basic metabolic panel    Standing Status:   Future    Standing Expiration Date:   08/31/2019    Order Specific Question:   Has the patient fasted?    Answer:   Yes  .  Hemoglobin A1c  Standing Status:   Future    Standing Expiration Date:   08/31/2019    Next appt:  01/06/2019 med mgt, fasting labs before  Rushil Kimbrell L. Lichelle Viets, D.O. [facility] [address], [place] [address] Cell Phone (Mon-Fri 8am-5pm):  [phone] On Call:  [phone] & follow prompts after 5pm & weekends Office Phone:  [phone] Office Fax:  [phone]

## 2018-09-16 ENCOUNTER — Ambulatory Visit (INDEPENDENT_AMBULATORY_CARE_PROVIDER_SITE_OTHER): Payer: Medicare Other | Admitting: Podiatry

## 2018-09-16 ENCOUNTER — Ambulatory Visit (INDEPENDENT_AMBULATORY_CARE_PROVIDER_SITE_OTHER): Payer: Medicare Other

## 2018-09-16 ENCOUNTER — Telehealth: Payer: Self-pay | Admitting: Podiatry

## 2018-09-16 ENCOUNTER — Encounter: Payer: Self-pay | Admitting: Podiatry

## 2018-09-16 DIAGNOSIS — M10072 Idiopathic gout, left ankle and foot: Secondary | ICD-10-CM

## 2018-09-16 MED ORDER — INDOMETHACIN 50 MG PO CAPS: 50.0000 mg | ORAL_CAPSULE | Freq: Two times a day (BID) | ORAL | 0 refills | Status: AC

## 2018-09-16 NOTE — Patient Instructions (Signed)

## 2018-09-16 NOTE — Telephone Encounter (Signed)
I spoke with pt and informed that the orders for the uric acid were in the computer for [facility]. Pt states they sent him to [place]. [place] told pt [facility] was [address] and asked if he wanted to have the labs drawn there. Pt states he will being seeing a doctor in the area tomorrow and will go to [facility] then.

## 2018-09-16 NOTE — Progress Notes (Signed)
Subjective:  Adrian Matthews presents to clinic today complaining of painful, red, swollen great toe left foot.  Pain and redness have been present for the past week.  Patient relates tenderness to light touch with bed sheet and with ambulation. He states he has had this occur in the past and was given medication.  He does admit to eating [place] over the Thanksgiving holiday.  Adrian Matthews denies any trauma or cuts to his feet.  Objective:  Neurovascular status intact b/l.  Left great toe warm, erythematous, edematous and painful to touch to level of IPJ.  No broken skin, no evidence of abscess.  Xray left foot:  no bone erosion noted at IPJ Hallux hammertoe deformity  Assessment: Acute gout left great toe  Plan: 1. Discussed gout and treatment plan with patient. Patient in agreement. 2. Uric acid ordered today. 3. Xray great toe left foot 4. Rx sent to pharmacy for indomethacin 50 mg po bid x 7 days 5. Patient given information sheet on foods to avoid.  6. Follow up: 2 weeks 7. Adrian Matthews to call should his symptoms worsen or if he has any questions in the interim. 8. Sent PCP, Dr. Hollace Kinnier a message regarding today's visit.

## 2018-09-16 NOTE — Telephone Encounter (Signed)
Pt was seen this morning and was sent to have lab work done. When the patient went to [facility] they had not received his orders. Please give pt a call.

## 2018-09-17 ENCOUNTER — Other Ambulatory Visit: Payer: Self-pay | Admitting: Gastroenterology

## 2018-09-17 DIAGNOSIS — M10072 Idiopathic gout, left ankle and foot: Secondary | ICD-10-CM | POA: Diagnosis not present

## 2018-09-17 LAB — URIC ACID: URIC ACID, SERUM: 8.9 mg/dL — AB (ref 4.0–8.0)

## 2018-09-30 ENCOUNTER — Ambulatory Visit (INDEPENDENT_AMBULATORY_CARE_PROVIDER_SITE_OTHER): Payer: Medicare Other | Admitting: Podiatry

## 2018-09-30 ENCOUNTER — Telehealth: Payer: Self-pay | Admitting: *Deleted

## 2018-09-30 DIAGNOSIS — M10072 Idiopathic gout, left ankle and foot: Secondary | ICD-10-CM | POA: Diagnosis not present

## 2018-09-30 MED ORDER — ALLOPURINOL 100 MG PO TABS: 100.0000 mg | ORAL_TABLET | Freq: Every day | ORAL | 3 refills | Status: DC

## 2018-09-30 NOTE — Telephone Encounter (Signed)
Spoke with patient and he's talking about Allopurinol, ok to send in rx? 100mg  or 300mg ?

## 2018-09-30 NOTE — Telephone Encounter (Signed)
Spoke with patient and advised results rx sent to pharmacy by e-script  

## 2018-09-30 NOTE — Patient Instructions (Signed)

## 2018-09-30 NOTE — Telephone Encounter (Signed)
Patient called wanting to speak with [facility] and stated he was calling in regards to Dr. Elisha Ponder talking to Dr. Mariea Clonts about Prescribing a gout medication. Patient saw her this morning and told him that there was a long term medication to help prevent the gout and she was going to discuss this with Dr. Mariea Clonts.  Please Advise.

## 2018-09-30 NOTE — Telephone Encounter (Signed)
100mg  allopurinol daily is fine.

## 2018-10-11 ENCOUNTER — Other Ambulatory Visit: Payer: Self-pay | Admitting: Internal Medicine

## 2018-10-19 ENCOUNTER — Other Ambulatory Visit: Payer: Self-pay

## 2018-10-19 ENCOUNTER — Ambulatory Visit (HOSPITAL_COMMUNITY)
Admission: RE | Admit: 2018-10-19 | Discharge: 2018-10-19 | Disposition: A | Discharge status: Home / Self Care | Payer: Medicare Other | Attending: Gastroenterology | Admitting: Gastroenterology

## 2018-10-19 ENCOUNTER — Encounter (HOSPITAL_COMMUNITY)
Admission: RE | Disposition: A | Discharge status: Home / Self Care | Payer: Self-pay | Source: Home / Self Care | Attending: Gastroenterology

## 2018-10-19 ENCOUNTER — Encounter (HOSPITAL_COMMUNITY): Payer: Self-pay | Admitting: Anesthesiology

## 2018-10-19 ENCOUNTER — Ambulatory Visit (HOSPITAL_COMMUNITY): Payer: Medicare Other | Admitting: Anesthesiology

## 2018-10-19 DIAGNOSIS — Z794 Long term (current) use of insulin: Secondary | ICD-10-CM | POA: Diagnosis not present

## 2018-10-19 DIAGNOSIS — E119 Type 2 diabetes mellitus without complications: Secondary | ICD-10-CM | POA: Diagnosis not present

## 2018-10-19 DIAGNOSIS — Z6841 Body Mass Index (BMI) 40.0 and over, adult: Secondary | ICD-10-CM | POA: Diagnosis not present

## 2018-10-19 DIAGNOSIS — Z86010 Personal history of colonic polyps: Secondary | ICD-10-CM | POA: Diagnosis not present

## 2018-10-19 DIAGNOSIS — Z7982 Long term (current) use of aspirin: Secondary | ICD-10-CM | POA: Insufficient documentation

## 2018-10-19 DIAGNOSIS — K219 Gastro-esophageal reflux disease without esophagitis: Secondary | ICD-10-CM | POA: Insufficient documentation

## 2018-10-19 DIAGNOSIS — D124 Benign neoplasm of descending colon: Secondary | ICD-10-CM | POA: Diagnosis not present

## 2018-10-19 DIAGNOSIS — I129 Hypertensive chronic kidney disease with stage 1 through stage 4 chronic kidney disease, or unspecified chronic kidney disease: Secondary | ICD-10-CM | POA: Diagnosis not present

## 2018-10-19 DIAGNOSIS — N189 Chronic kidney disease, unspecified: Secondary | ICD-10-CM | POA: Diagnosis not present

## 2018-10-19 DIAGNOSIS — M109 Gout, unspecified: Secondary | ICD-10-CM | POA: Insufficient documentation

## 2018-10-19 DIAGNOSIS — D122 Benign neoplasm of ascending colon: Secondary | ICD-10-CM | POA: Diagnosis not present

## 2018-10-19 DIAGNOSIS — E785 Hyperlipidemia, unspecified: Secondary | ICD-10-CM | POA: Diagnosis not present

## 2018-10-19 DIAGNOSIS — E1122 Type 2 diabetes mellitus with diabetic chronic kidney disease: Secondary | ICD-10-CM | POA: Insufficient documentation

## 2018-10-19 DIAGNOSIS — Z1211 Encounter for screening for malignant neoplasm of colon: Secondary | ICD-10-CM | POA: Diagnosis not present

## 2018-10-19 DIAGNOSIS — K5730 Diverticulosis of large intestine without perforation or abscess without bleeding: Secondary | ICD-10-CM | POA: Diagnosis not present

## 2018-10-19 DIAGNOSIS — Z79899 Other long term (current) drug therapy: Secondary | ICD-10-CM | POA: Diagnosis not present

## 2018-10-19 DIAGNOSIS — D125 Benign neoplasm of sigmoid colon: Secondary | ICD-10-CM | POA: Diagnosis not present

## 2018-10-19 DIAGNOSIS — K635 Polyp of colon: Secondary | ICD-10-CM | POA: Insufficient documentation

## 2018-10-19 HISTORY — PX: COLONOSCOPY WITH PROPOFOL: SHX5780

## 2018-10-19 HISTORY — PX: POLYPECTOMY: SHX5525

## 2018-10-19 HISTORY — PX: BIOPSY: SHX5522

## 2018-10-19 LAB — GLUCOSE, CAPILLARY: Glucose-Capillary: 155 mg/dL — ABNORMAL HIGH (ref 70–99)

## 2018-10-19 SURGERY — COLONOSCOPY WITH PROPOFOL
Anesthesia: Monitor Anesthesia Care

## 2018-10-19 MED ORDER — ONDANSETRON HCL 4 MG/2ML IJ SOLN
INTRAMUSCULAR | Status: DC | PRN
  Administered 2018-10-19: 4 mg via INTRAVENOUS

## 2018-10-19 MED ORDER — LACTATED RINGERS IV SOLN: INTRAVENOUS | Status: DC | PRN

## 2018-10-19 MED ORDER — SODIUM CHLORIDE 0.9 % IV SOLN
INTRAVENOUS | Status: DC
  Administered 2018-10-19 (×2): via INTRAVENOUS

## 2018-10-19 MED ORDER — SODIUM CHLORIDE 0.9 % IV SOLN: INTRAVENOUS | Status: DC

## 2018-10-19 MED ORDER — PROPOFOL 500 MG/50ML IV EMUL
INTRAVENOUS | Status: DC | PRN
  Administered 2018-10-19: 50 mg via INTRAVENOUS

## 2018-10-19 MED ORDER — PROPOFOL 500 MG/50ML IV EMUL
INTRAVENOUS | Status: DC | PRN
  Administered 2018-10-19: 115 ug/kg/min via INTRAVENOUS

## 2018-10-19 MED ORDER — PHENYLEPHRINE HCL 10 MG/ML IJ SOLN
INTRAMUSCULAR | Status: DC | PRN
  Administered 2018-10-19 (×2): 80 ug via INTRAVENOUS

## 2018-10-19 MED ORDER — PROPOFOL 10 MG/ML IV BOLUS
INTRAVENOUS | Status: AC
  Filled 2018-10-19: qty 60

## 2018-10-19 SURGICAL SUPPLY — 22 items

## 2018-10-19 NOTE — Anesthesia Preprocedure Evaluation (Addendum)
Anesthesia Evaluation  Patient identified by MRN, date of birth, ID band Patient awake    Reviewed: Allergy & Precautions, NPO status , Patient's Chart, lab work & pertinent test results  History of Anesthesia Complications Negative for: history of anesthetic complications  Airway Mallampati: I  TM Distance: >3 FB Neck ROM: Full    Dental  (+) Dental Advisory Given, Caps   Pulmonary neg pulmonary ROS,    breath sounds clear to auscultation       Cardiovascular hypertension, Pt. on medications (-) angina Rhythm:Regular Rate:Normal     Neuro/Psych negative neurological ROS     GI/Hepatic Neg liver ROS, GERD  Controlled,  Endo/Other  diabetes (glu 155), Insulin DependentMorbid obesity  Renal/GU Renal InsufficiencyRenal disease     Musculoskeletal   Abdominal (+) + obese,   Peds  Hematology negative hematology ROS (+)   Anesthesia Other Findings   Reproductive/Obstetrics                           Anesthesia Physical Anesthesia Plan  ASA: III  Anesthesia Plan: MAC   Post-op Pain Management:    Induction:   PONV Risk Score and Plan: 1 and Treatment may vary due to age or medical condition  Airway Management Planned: Nasal Cannula and Natural Airway  Additional Equipment:   Intra-op Plan:   Post-operative Plan:   Informed Consent: I have reviewed the patients History and Physical, chart, labs and discussed the procedure including the risks, benefits and alternatives for the proposed anesthesia with the patient or authorized representative who has indicated his/her understanding and acceptance.   Dental advisory given  Plan Discussed with: CRNA and Surgeon  Anesthesia Plan Comments: (Plan routine monitors, MAC)        Anesthesia Quick Evaluation

## 2018-10-19 NOTE — Transfer of Care (Signed)
Immediate Anesthesia Transfer of Care Note  Patient: Adrian Matthews  Procedure(s) Performed: Procedure(s): COLONOSCOPY WITH PROPOFOL (N/A) BIOPSY POLYPECTOMY  Patient Location: PACU  Anesthesia Type:MAC  Level of Consciousness:  sedated, patient cooperative and responds to stimulation  Airway & Oxygen Therapy:Patient Spontanous Breathing and Patient connected to face mask oxgen  Post-op Assessment:  Report given to PACU RN and Post -op Vital signs reviewed and stable  Post vital signs:  Reviewed and stable  Last Vitals:  Vitals:   10/19/18 0844  BP: (!) 162/70  Pulse: 95  Resp: 17  Temp: 36.7 C  SpO2: 21%    Complications: No apparent anesthesia complications

## 2018-10-19 NOTE — Discharge Instructions (Signed)
YOU HAD AN ENDOSCOPIC PROCEDURE TODAY: Refer to the procedure report and other information in the discharge instructions given to you for any specific questions about what was found during the examination. If this information does not answer your questions, please call [facility] office at [phone] to clarify.   YOU SHOULD EXPECT: Some feelings of bloating in the abdomen. Passage of more gas than usual. Walking can help get rid of the air that was put into your GI tract during the procedure and reduce the bloating. If you had a lower endoscopy (such as a colonoscopy or flexible sigmoidoscopy) you may notice spotting of blood in your stool or on the toilet paper. Some abdominal soreness may be present for a day or two, also.  DIET: Your first meal following the procedure should be a light meal and then it is ok to progress to your normal diet. A half-sandwich or bowl of soup is an example of a good first meal. Heavy or fried foods are harder to digest and may make you feel nauseous or bloated. Drink plenty of fluids but you should avoid alcoholic beverages for 24 hours. If you had a esophageal dilation, please see attached instructions for diet.   ACTIVITY: Your care partner should take you home directly after the procedure. You should plan to take it easy, moving slowly for the rest of the day. You can resume normal activity the day after the procedure however YOU SHOULD NOT DRIVE, use power tools, machinery or perform tasks that involve climbing or major physical exertion for 24 hours (because of the sedation medicines used during the test).   SYMPTOMS TO REPORT IMMEDIATELY: A gastroenterologist can be reached at any hour. Please call [phone]  for any of the following symptoms:   Following lower endoscopy (colonoscopy, flexible sigmoidoscopy) Excessive amounts of blood in the stool  Significant tenderness, worsening of abdominal pains  Swelling of the abdomen that is new, acute  Fever of 100  or higher    FOLLOW UP:  If any biopsies were taken you will be contacted by phone or by letter within the next 1-3 weeks. Call [phone]  if you have not heard about the biopsies in 3 weeks.  Please also call with any specific questions about appointments or follow up tests.    Colonoscopy, Adult, Care After This sheet gives you information about how to care for yourself after your procedure. Your doctor may also give you more specific instructions. If you have problems or questions, call your doctor. What can I expect after the procedure? After the procedure, it is common to have:  A small amount of blood in your poop for 24 hours.  Some gas.  Mild cramping or bloating in your belly. Follow these instructions at home: General instructions  For the first 24 hours after the procedure: ? Do not drive or use machinery. ? Do not sign important documents. ? Do not drink alcohol. ? Do your daily activities more slowly than normal. ? Eat foods that are soft and easy to digest.  Take over-the-counter or prescription medicines only as told by your doctor. To help cramping and bloating:   Try walking around.  Put heat on your belly (abdomen) as told by your doctor. Use a heat source that your doctor recommends, such as a moist heat pack or a heating pad. ? Put a towel between your skin and the heat source. ? Leave the heat on for 20-30 minutes. ? Remove the heat if your skin  turns bright red. This is especially important if you cannot feel pain, heat, or cold. You can get burned. Eating and drinking   Drink enough fluid to keep your pee (urine) clear or pale yellow.  Return to your normal diet as told by your doctor. Avoid heavy or fried foods that are hard to digest.  Avoid drinking alcohol for as long as told by your doctor. Contact a doctor if:  You have blood in your poop (stool) 2-3 days after the procedure. Get help right away if:  You have more than a small amount  of blood in your poop.  You see large clumps of tissue (blood clots) in your poop.  Your belly is swollen.  You feel sick to your stomach (nauseous).  You throw up (vomit).  You have a fever.  You have belly pain that gets worse, and medicine does not help your pain. Summary  After the procedure, it is common to have a small amount of blood in your poop. You may also have mild cramping and bloating in your belly.  For the first 24 hours after the procedure, do not drive or use machinery, do not sign important documents, and do not drink alcohol.  Get help right away if you have a lot of blood in your poop, feel sick to your stomach, have a fever, or have more belly pain. This information is not intended to replace advice given to you by your health care provider. Make sure you discuss any questions you have with your health care provider. Document Released: 11/01/2010 Document Revised: 07/30/2017 Document Reviewed: 06/23/2016 Elsevier Interactive Patient Education  2019 [employer].

## 2018-10-19 NOTE — H&P (Signed)
Adrian Matthews is an 73 y.o. male.   Chief Complaint: History of colon polyps HPI: Pleasant 73 year old gentleman is approximately 5 years status post removal of several diminutive adenomas.  Despite severe obesity with a body weight of 375 pounds and a BMI of 52, he remains physically quite active, swimming at the [facility] several times a week.  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  Past Medical History:  Diagnosis Date  . Asymptomatic varicose veins   . Chronic kidney disease   . Diaphragmatic hernia without mention of obstruction or gangrene   . DM (diabetes mellitus) ([place])   . GERD (gastroesophageal reflux disease)   . Gout, unspecified   . Hypercalcemia   . Hyperlipidemia   . Hyperosmolality and/or hypernatremia   . Hypertension   . Monoclonal paraproteinemia   . New daily persistent headache   . Osteoarthrosis, unspecified whether generalized or localized, lower leg   . Other malaise and fatigue   . Primary localized osteoarthrosis, unspecified site   . Unspecified disorder of kidney and ureter   . Unspecified hereditary and idiopathic peripheral neuropathy     Past Surgical History:  Procedure Laterality Date  . CARPAL TUNNEL RELEASE  2007  . COLONOSCOPY N/A 06/06/2013   Procedure: COLONOSCOPY;  Surgeon: Cleotis Nipper, MD;  Location: [facility] ENDOSCOPY;  Service: Endoscopy;  Laterality: N/A;  . TUMOR REMOVAL  2017   Pituitary adenoma    Family History  Problem Relation Age of Onset  . Hypertension Sister   . Hypertension Brother   . Diabetes Brother   . Hypertension Brother   . Hypertension Sister   . Migraines Neg Hx    Social History:  reports that he has never smoked. He has never used smokeless tobacco. He reports that he does not drink alcohol or use drugs.  Allergies: No Known Allergies  Medications Prior to Admission  Medication Sig Dispense Refill  . amLODipine (NORVASC) 10 MG tablet TAKE 1  TABLET DAILY FOR BLOOD PRESSURE (Patient taking differently: Take 10 mg by mouth daily. ) 90 tablet 1  . aspirin EC 81 MG tablet Take 1 tablet (81 mg total) by mouth daily. 30 tablet 11  . atorvastatin (LIPITOR) 40 MG tablet TAKE 1 TABLET DAILY (Patient taking differently: Take 40 mg by mouth daily. ) 90 tablet 3  . Cholecalciferol (VITAMIN D3) 2000 units TABS Take 2,000 Units by mouth daily.     Marland Kitchen FREESTYLE LITE test strip CHECK BLOOD SUGAR THREE TIMES A DAY 300 each 3  . gabapentin (NEURONTIN) 300 MG capsule Take 1 capsule (300 mg total) by mouth 3 (three) times daily. (Patient taking differently: Take 300 mg by mouth 3 (three) times daily as needed (pain.). ) 90 capsule 11  . Insulin Glargine (LANTUS SOLOSTAR) 100 UNIT/ML Solostar Pen Inject 66 Units into the skin daily. (Patient taking differently: Inject 66 Units into the skin every evening. ) 60 mL 6  . insulin lispro (HUMALOG KWIKPEN) 100 UNIT/ML KiwkPen Inject 22 units before breakfast, 26 units before lunch & supper - plus sliding scale. (Patient taking differently: Inject 22-26 Units into the skin See admin instructions. Inject 22 units before breakfast, 26 units before lunch & supper - plus sliding scale.) 30 pen 3  . KLOR-CON M20 20 MEQ tablet TAKE 1 TABLET TWICE A DAY (Patient taking differently: Take 20 mEq by mouth 2 (two) times daily. ) 180 tablet 3  . losartan (COZAAR) 100  MG tablet TAKE 1 TABLET DAILY 90 tablet 1  . Omega-3 Fatty Acids (FISH OIL) 1200 MG CAPS Take 1,200 mg by mouth daily.     . pantoprazole (PROTONIX) 40 MG tablet TAKE 1 TABLET DAILY (Patient taking differently: Take 40 mg by mouth daily. ) 90 tablet 3  . allopurinol (ZYLOPRIM) 100 MG tablet Take 1 tablet (100 mg total) by mouth daily. 90 tablet 3  . chlorthalidone (HYGROTON) 25 MG tablet TAKE 1 TABLET DAILY FOR BLOOD PRESSURE 90 tablet 1  . Insulin Pen Needle (SURE COMFORT PEN NEEDLES) 32G X 4 MM MISC Use 4 times daily before meals DX: E11.65 500 each 1    Results  for orders placed or performed during the hospital encounter of 10/19/18 (from the past 48 hour(s))  Glucose, capillary     Status: Abnormal   Collection Time: 10/19/18  9:00 AM  Result Value Ref Range   Glucose-Capillary 155 (H) 70 - 99 mg/dL   No results found.  ROS  Blood pressure (!) 162/70, pulse 95, temperature 98 F (36.7 C), temperature source Oral, resp. rate 17, height 5\' 9"  (1.753 m), weight (!) 158.8 kg, SpO2 99 %. Physical Exam  Pleasant, alert, no evident distress.  Chest clear, heart normal, abdomen very adipose but without mass-effect or tenderness.  Cognitively intact, no evident focal neurologic deficits  Assessment/Plan Clinically appropriate for surveillance colonoscopy.  He is at statistically increased risk for colon cancer by virtue of his obesity, so I feel the procedure is appropriate.  We are doing it here at the hospital because of his large BMI.  Cleotis Nipper, MD 10/19/2018, 10:00 AM

## 2018-10-19 NOTE — Op Note (Signed)
[facility] Patient Name: Adrian Matthews Procedure Date: 10/19/2018 MRN: [id number] Attending MD: Ronald Lobo , MD Date of Birth: 16-Jun-1946 CSN: [id number] Age: 73 Admit Type: Outpatient Procedure:                Colonoscopy Indications:              High risk colon cancer surveillance: Personal                            history of multiple (3 or more) adenomas, Last                            colonoscopy: August 2014 Providers:                Ronald Lobo, MD, Cleda Daub, RN, Tinnie Gens,                            Technician, Arnoldo Hooker, CRNA Referring MD:              Medicines:                Monitored Anesthesia Care Complications:            No immediate complications. Estimated Blood Loss:     Estimated blood loss: 1 mL. Procedure:                Pre-Anesthesia Assessment:                           - Prior to the procedure, a History and Physical                            was performed, and patient medications and                            allergies were reviewed. The patient's tolerance of                            previous anesthesia was also reviewed. The risks                            and benefits of the procedure and the sedation                            options and risks were discussed with the patient.                            All questions were answered, and informed consent                            was obtained. Prior Anticoagulants: The patient has                            taken aspirin, last dose was 1 day prior to  procedure. ASA Grade Assessment: III - A patient                            with severe systemic disease. After reviewing the                            risks and benefits, the patient was deemed in                            satisfactory condition to undergo the procedure.                           After obtaining informed consent, the colonoscope                            was passed under  direct vision. Throughout the                            procedure, the patient's blood pressure, pulse, and                            oxygen saturations were monitored continuously. The                            CF-HQ190L ([id number]) Olympus adult colonoscope was                            introduced through the anus and advanced to the the                            cecum, identified by appendiceal orifice and                            ileocecal valve. The colonoscopy was performed                            without difficulty. The patient tolerated the                            procedure well. The quality of the bowel                            preparation was excellent and was photodocumented                            in all segments. Scope In: 10:09:15 AM Scope Out: 10:34:43 AM Scope Withdrawal Time: 0 hours 17 minutes 20 seconds  Total Procedure Duration: 0 hours 25 minutes 28 seconds  Findings:      The digital rectal exam was normal. Pertinent negatives include normal       prostate (size, shape, and consistency) to the degree possible based on       the patient's large body habitus.      A 3 mm polyp was found in the sigmoid colon. The polyp was sessile.  The       polyp was removed with a cold snare. Resection and retrieval were       complete. Estimated blood loss was minimal.      A 3 mm polyp was found in the ascending colon. The polyp was sessile.       Biopsies were taken with a cold forceps for histology. Estimated blood       loss was minimal.      A 3 mm polyp was found in the distal descending colon. The polyp was       sessile. The polyp was removed with a cold snare. Resection and       retrieval were complete. Estimated blood loss was minimal.      A 9 mm polyp was found in the distal descending colon. The polyp was       semi-pedunculated. The polyp was removed with a cold snare. Resection       and retrieval were complete. Estimated blood loss: 1 mL.      A  single medium-mouthed diverticulum was found in the proximal ascending       colon.      No other significant abnormalities were identified in a careful       examination of the remainder of the colon. The right colon was       reinspected without additional findings.      The retroflexed view of the distal rectum and anal verge was normal and       showed no anal or rectal abnormalities. Reinspection of the rectum       showed no additional findings. Impression:               - One 3 mm polyp in the sigmoid colon, removed with                            a cold snare. Resected and retrieved.                           - One 3 mm polyp in the ascending colon. Biopsied.                           - One 3 mm polyp in the distal descending colon,                            removed with a cold snare. Resected and retrieved.                           - One 9 mm polyp in the distal descending colon,                            removed with a cold snare. Resected and retrieved.                           - Diverticulosis in the proximal ascending colon.                           - The distal rectum and anal verge are normal on  retroflexion view. Moderate Sedation:      This patient was sedated with monitored anesthesia care, not moderate       sedation. Recommendation:           - Await pathology results.                           - If the pathology report reveals adenomatous                            tissue, then repeat the colonoscopy for                            surveillance in 3 - 5 years. Procedure Code(s):        --- Professional ---                           [phone], Colonoscopy, flexible; with removal of                            tumor(s), polyp(s), or other lesion(s) by snare                            technique                           [id number], 51, Colonoscopy, flexible; with biopsy,                            single or multiple Diagnosis Code(s):        ---  Professional ---                           D12.5, Benign neoplasm of sigmoid colon                           D12.2, Benign neoplasm of ascending colon                           D12.4, Benign neoplasm of descending colon                           Z86.010, Personal history of colonic polyps CPT copyright 2018 American [employer] Association. All rights reserved. The codes documented in this report are preliminary and upon coder review may  be revised to meet current compliance requirements. Ronald Lobo, MD 10/19/2018 10:57:02 AM This report has been signed electronically. Number of Addenda: 0

## 2018-10-19 NOTE — Anesthesia Postprocedure Evaluation (Signed)
Anesthesia Post Note  Patient: Adrian Matthews  Procedure(s) Performed: COLONOSCOPY WITH PROPOFOL (N/A ) BIOPSY POLYPECTOMY     Patient location during evaluation: Endoscopy Anesthesia Type: MAC Level of consciousness: awake and alert, patient cooperative and oriented Pain management: pain level controlled Vital Signs Assessment: post-procedure vital signs reviewed and stable Respiratory status: spontaneous breathing, nonlabored ventilation and respiratory function stable Cardiovascular status: blood pressure returned to baseline and stable Postop Assessment: no apparent nausea or vomiting Anesthetic complications: no    Last Vitals:  Vitals:   10/19/18 1045 10/19/18 1053  BP: (!) 125/58 (!) 114/52  Pulse: 80 79  Resp: 19 14  Temp:    SpO2: 100% 100%    Last Pain:  Vitals:   10/19/18 1053  TempSrc:   PainSc: 0-No pain                 Pamalee Marcoe,E. Hamda Klutts

## 2018-10-21 ENCOUNTER — Encounter (HOSPITAL_COMMUNITY): Payer: Self-pay | Admitting: Gastroenterology

## 2018-10-22 ENCOUNTER — Ambulatory Visit (INDEPENDENT_AMBULATORY_CARE_PROVIDER_SITE_OTHER): Payer: Medicare Other | Admitting: Podiatry

## 2018-10-22 DIAGNOSIS — B351 Tinea unguium: Secondary | ICD-10-CM | POA: Diagnosis not present

## 2018-10-22 DIAGNOSIS — M79674 Pain in right toe(s): Secondary | ICD-10-CM | POA: Diagnosis not present

## 2018-10-22 DIAGNOSIS — M79675 Pain in left toe(s): Secondary | ICD-10-CM | POA: Diagnosis not present

## 2018-10-22 NOTE — Patient Instructions (Addendum)
Diabetes Mellitus and Foot Care Foot care is an important part of your health, especially when you have diabetes. Diabetes may cause you to have problems because of poor blood flow (circulation) to your feet and legs, which can cause your skin to:  Become thinner and drier.  Break more easily.  Heal more slowly.  Peel and crack. You may also have nerve damage (neuropathy) in your legs and feet, causing decreased feeling in them. This means that you may not notice minor injuries to your feet that could lead to more serious problems. Noticing and addressing any potential problems early is the best way to prevent future foot problems. How to care for your feet Foot hygiene  Wash your feet daily with warm water and mild soap. Do not use hot water. Then, pat your feet and the areas between your toes until they are completely dry. Do not soak your feet as this can dry your skin.  Trim your toenails straight across. Do not dig under them or around the cuticle. File the edges of your nails with an emery board or nail file.  Apply a moisturizing lotion or petroleum jelly to the skin on your feet and to dry, brittle toenails. Use lotion that does not contain alcohol and is unscented. Do not apply lotion between your toes. Shoes and socks  Wear clean socks or stockings every day. Make sure they are not too tight. Do not wear knee-high stockings since they may decrease blood flow to your legs.  Wear shoes that fit properly and have enough cushioning. Always look in your shoes before you put them on to be sure there are no objects inside.  To break in new shoes, wear them for just a few hours a day. This prevents injuries on your feet. Wounds, scrapes, corns, and calluses  Check your feet daily for blisters, cuts, bruises, sores, and redness. If you cannot see the bottom of your feet, use a mirror or ask someone for help.  Do not cut corns or calluses or try to remove them with medicine.  If you  find a minor scrape, cut, or break in the skin on your feet, keep it and the skin around it clean and dry. You may clean these areas with mild soap and water. Do not clean the area with peroxide, alcohol, or iodine.  If you have a wound, scrape, corn, or callus on your foot, look at it several times a day to make sure it is healing and not infected. Check for: ? Redness, swelling, or pain. ? Fluid or blood. ? Warmth. ? Pus or a bad smell. General instructions  Do not cross your legs. This may decrease blood flow to your feet.  Do not use heating pads or hot water bottles on your feet. They may burn your skin. If you have lost feeling in your feet or legs, you may not know this is happening until it is too late.  Protect your feet from hot and cold by wearing shoes, such as at the beach or on hot pavement.  Schedule a complete foot exam at least once a year (annually) or more often if you have foot problems. If you have foot problems, report any cuts, sores, or bruises to your health care provider immediately. Contact a health care provider if:  You have a medical condition that increases your risk of infection and you have any cuts, sores, or bruises on your feet.  You have an injury that is not   healing.  You have redness on your legs or feet.  You feel burning or tingling in your legs or feet.  You have pain or cramps in your legs and feet.  Your legs or feet are numb.  Your feet always feel cold.  You have pain around a toenail. Get help right away if:  You have a wound, scrape, corn, or callus on your foot and: ? You have pain, swelling, or redness that gets worse. ? You have fluid or blood coming from the wound, scrape, corn, or callus. ? Your wound, scrape, corn, or callus feels warm to the touch. ? You have pus or a bad smell coming from the wound, scrape, corn, or callus. ? You have a fever. ? You have a red line going up your leg. Summary  Check your feet every day  for cuts, sores, red spots, swelling, and blisters.  Moisturize feet and legs daily.  Wear shoes that fit properly and have enough cushioning.  If you have foot problems, report any cuts, sores, or bruises to your health care provider immediately.  Schedule a complete foot exam at least once a year (annually) or more often if you have foot problems. This information is not intended to replace advice given to you by your health care provider. Make sure you discuss any questions you have with your health care provider. Document Released: 09/26/2000 Document Revised: 11/11/2017 Document Reviewed: 10/31/2016 Elsevier Interactive Patient Education  2019 Elsevier Inc. Onychomycosis/Fungal Toenails  WHAT IS IT? An infection that lies within the keratin of your nail plate that is caused by a fungus.  WHY ME? Fungal infections affect all ages, sexes, races, and creeds.  There may be many factors that predispose you to a fungal infection such as age, coexisting medical conditions such as diabetes, or an autoimmune disease; stress, medications, fatigue, genetics, etc.  Bottom line: fungus thrives in a warm, moist environment and your shoes offer such a location.  IS IT CONTAGIOUS? Theoretically, yes.  You do not want to share shoes, nail clippers or files with someone who has fungal toenails.  Walking around barefoot in the same room or sleeping in the same bed is unlikely to transfer the organism.  It is important to realize, however, that fungus can spread easily from one nail to the next on the same foot.  HOW DO WE TREAT THIS?  There are several ways to treat this condition.  Treatment may depend on many factors such as age, medications, pregnancy, liver and kidney conditions, etc.  It is best to ask your doctor which options are available to you.  No treatment.   Unlike many other medical concerns, you can live with this condition.  However for many people this can be a painful condition and may  lead to ingrown toenails or a bacterial infection.  It is recommended that you keep the nails cut short to help reduce the amount of fungal nail. Topical treatment.  These range from herbal remedies to prescription strength nail lacquers.  About 40-50% effective, topicals require twice daily application for approximately 9 to 12 months or until an entirely new nail has grown out.  The most effective topicals are medical grade medications available through physicians offices. Oral antifungal medications.  With an 80-90% cure rate, the most common oral medication requires 3 to 4 months of therapy and stays in your system for a year as the new nail grows out.  Oral antifungal medications do require blood work to make  sure it is a safe drug for you.  A liver function panel will be performed prior to starting the medication and after the first month of treatment.  It is important to have the blood work performed to avoid any harmful side effects.  In general, this medication safe but blood work is required. Laser Therapy.  This treatment is performed by applying a specialized laser to the affected nail plate.  This therapy is noninvasive, fast, and non-painful.  It is not covered by insurance and is therefore, out of pocket.  The results have been very good with a 80-95% cure rate.  The [facility] is the only practice in the area to offer this therapy. Permanent Nail Avulsion.  Removing the entire nail so that a new nail will not grow back.

## 2018-10-24 ENCOUNTER — Other Ambulatory Visit: Payer: Self-pay | Admitting: Internal Medicine

## 2018-11-04 ENCOUNTER — Encounter: Payer: Self-pay | Admitting: Podiatry

## 2018-11-04 NOTE — Progress Notes (Signed)
Subjective:  Adrian Matthews presents for follow up gout of left great toe.  He relates his toe feels much better on today.  Objective:  Neurovascular status intact b/l.  Left great toe with resolve erythema/edema.  No pain on palpation. No pain on passive/active ROM.  Lab Results  Component Value Date   LABURIC 8.9 (H) 09/17/2018   Assessment:  Acute gout left great toe, resolved  Plan: 1.  Discussed uric acid results and treatment plan with patient. He will see Dr. Mariea Clonts for long-term medication management. Patient in agreement. 2.  He is to follow up with me in 3 months for his preventative foot care.

## 2018-11-12 ENCOUNTER — Encounter: Payer: Self-pay | Admitting: Podiatry

## 2018-11-12 NOTE — Progress Notes (Signed)
Subjective: Adrian Matthews presents today with history of neuropathy with cc of painful, mycotic toenails.  Pain is aggravated when wearing enclosed shoe gear and relieved with periodic professional debridement.  Patient has peripheral neuropathy managed with gabapentin.  He relates that he was given a prescription for allopurinol 100 mg by Dr. Mariea Matthews and he is to take it once a day.  [facility]  Adrian Curry, DO is his PCP.    Current Outpatient Medications:  .  allopurinol (ZYLOPRIM) 100 MG tablet, Take 1 tablet (100 mg total) by mouth daily., Disp: 90 tablet, Rfl: 3 .  amLODipine (NORVASC) 10 MG tablet, TAKE 1 TABLET DAILY FOR BLOOD PRESSURE (Patient taking differently: Take 10 mg by mouth daily. ), Disp: 90 tablet, Rfl: 1 .  aspirin EC 81 MG tablet, Take 1 tablet (81 mg total) by mouth daily., Disp: 30 tablet, Rfl: 11 .  atorvastatin (LIPITOR) 40 MG tablet, TAKE 1 TABLET DAILY (Patient taking differently: Take 40 mg by mouth daily. ), Disp: 90 tablet, Rfl: 3 .  chlorthalidone (HYGROTON) 25 MG tablet, TAKE 1 TABLET DAILY FOR BLOOD PRESSURE, Disp: 90 tablet, Rfl: 1 .  Cholecalciferol (VITAMIN D3) 2000 units TABS, Take 2,000 Units by mouth daily. , Disp: , Rfl:  .  FREESTYLE LITE test strip, CHECK BLOOD SUGAR THREE TIMES A DAY, Disp: 300 each, Rfl: 3 .  gabapentin (NEURONTIN) 300 MG capsule, Take 1 capsule (300 mg total) by mouth 3 (three) times daily. (Patient taking differently: Take 300 mg by mouth 3 (three) times daily as needed (pain.). ), Disp: 90 capsule, Rfl: 11 .  Insulin Glargine (LANTUS SOLOSTAR) 100 UNIT/ML Solostar Pen, Inject 66 Units into the skin daily. (Patient taking differently: Inject 66 Units into the skin every evening. ), Disp: 60 mL, Rfl: 6 .  insulin lispro (HUMALOG KWIKPEN) 100 UNIT/ML KiwkPen, Inject 22 units before breakfast, 26 units before lunch & supper - plus sliding scale. (Patient taking differently: Inject 22-26 Units into  the skin See admin instructions. Inject 22 units before breakfast, 26 units before lunch & supper - plus sliding scale.), Disp: 30 pen, Rfl: 3 .  Insulin Pen Needle (SURE COMFORT PEN NEEDLES) 32G X 4 MM MISC, Use 4 times daily before meals DX: E11.65, Disp: 500 each, Rfl: 1 .  KLOR-CON M20 20 MEQ tablet, TAKE 1 TABLET TWICE A DAY (Patient taking differently: Take 20 mEq by mouth 2 (two) times daily. ), Disp: 180 tablet, Rfl: 3 .  losartan (COZAAR) 100 MG tablet, TAKE 1 TABLET DAILY, Disp: 90 tablet, Rfl: 1 .  Omega-3 Fatty Acids (FISH OIL) 1200 MG CAPS, Take 1,200 mg by mouth daily. , Disp: , Rfl:  .  pantoprazole (PROTONIX) 40 MG tablet, TAKE 1 TABLET DAILY (Patient taking differently: Take 40 mg by mouth daily. ), Disp: 90 tablet, Rfl: 3 .  Iron-FA-B Cmp-C-Biot-Probiotic (FUSION PLUS) CAPS, TAKE 1 CAPSULE EVERY OTHER DAY, Disp: 45 capsule, Rfl: 4  No Known Allergies  Objective:  Vascular Examination: Capillary refill time less than 3 seconds to all 10 digits Dorsalis pedis and Posterior tibial pulses are present bilaterally Digital hair x 10 digits was absent Skin temperature gradient WNL b/l  Dermatological Examination: Skin with normal turgor, texture and tone b/l  No open wounds or interdigital macerations noted bilaterally.  Toenails 1-5 b/l discolored, thick, dystrophic with subungual debris and pain with palpation to nailbeds due to thickness of nails.  Musculoskeletal: Muscle strength 5/5 to all  muscle groups b/l Collapse of medial arch consistent with pes planus foot type bilaterally  Neurological: Sensation with 10 gram monofilament is absent b/l Vibratory sensation absent b/l  Assessment: 1. Painful onychomycosis toenails 1-5 b/l 2. NIDDM with neuropathy  Plan: 1. Toenails 1-5 b/l were debrided in length and girth without iatrogenic bleeding. 2. Patient to continue soft, supportive shoe gear 3. Patient to report any pedal injuries to medical professional   4. Follow up 10 weeks.  Patient/POA to call should there be a concern in the interim.

## 2018-11-24 DIAGNOSIS — D126 Benign neoplasm of colon, unspecified: Secondary | ICD-10-CM | POA: Diagnosis not present

## 2018-11-24 DIAGNOSIS — N183 Chronic kidney disease, stage 3 (moderate): Secondary | ICD-10-CM | POA: Diagnosis not present

## 2018-11-24 DIAGNOSIS — E785 Hyperlipidemia, unspecified: Secondary | ICD-10-CM | POA: Insufficient documentation

## 2018-11-24 DIAGNOSIS — E221 Hyperprolactinemia: Secondary | ICD-10-CM | POA: Diagnosis not present

## 2018-11-24 DIAGNOSIS — Z1339 Encounter for screening examination for other mental health and behavioral disorders: Secondary | ICD-10-CM | POA: Diagnosis not present

## 2018-11-24 DIAGNOSIS — E1129 Type 2 diabetes mellitus with other diabetic kidney complication: Secondary | ICD-10-CM | POA: Diagnosis not present

## 2018-11-24 DIAGNOSIS — Z794 Long term (current) use of insulin: Secondary | ICD-10-CM | POA: Diagnosis not present

## 2018-11-24 DIAGNOSIS — D352 Benign neoplasm of pituitary gland: Secondary | ICD-10-CM | POA: Insufficient documentation

## 2018-11-24 DIAGNOSIS — E7849 Other hyperlipidemia: Secondary | ICD-10-CM | POA: Diagnosis not present

## 2018-11-24 DIAGNOSIS — Z1331 Encounter for screening for depression: Secondary | ICD-10-CM | POA: Diagnosis not present

## 2018-11-24 DIAGNOSIS — E291 Testicular hypofunction: Secondary | ICD-10-CM | POA: Diagnosis not present

## 2018-12-07 ENCOUNTER — Other Ambulatory Visit: Payer: Self-pay | Admitting: Internal Medicine

## 2018-12-07 DIAGNOSIS — K219 Gastro-esophageal reflux disease without esophagitis: Secondary | ICD-10-CM

## 2018-12-30 ENCOUNTER — Other Ambulatory Visit: Payer: Self-pay

## 2018-12-30 ENCOUNTER — Other Ambulatory Visit: Payer: Medicare Other

## 2019-01-06 ENCOUNTER — Ambulatory Visit (INDEPENDENT_AMBULATORY_CARE_PROVIDER_SITE_OTHER): Payer: Medicare Other | Admitting: Internal Medicine

## 2019-01-06 ENCOUNTER — Other Ambulatory Visit: Payer: Self-pay

## 2019-01-06 ENCOUNTER — Encounter: Payer: Self-pay | Admitting: Internal Medicine

## 2019-01-06 DIAGNOSIS — D352 Benign neoplasm of pituitary gland: Secondary | ICD-10-CM | POA: Diagnosis not present

## 2019-01-06 DIAGNOSIS — E229 Hyperfunction of pituitary gland, unspecified: Secondary | ICD-10-CM | POA: Diagnosis not present

## 2019-01-06 DIAGNOSIS — Z794 Long term (current) use of insulin: Secondary | ICD-10-CM | POA: Diagnosis not present

## 2019-01-06 DIAGNOSIS — E1140 Type 2 diabetes mellitus with diabetic neuropathy, unspecified: Secondary | ICD-10-CM | POA: Diagnosis not present

## 2019-01-06 DIAGNOSIS — Z6841 Body Mass Index (BMI) 40.0 and over, adult: Secondary | ICD-10-CM

## 2019-01-06 DIAGNOSIS — E1169 Type 2 diabetes mellitus with other specified complication: Secondary | ICD-10-CM | POA: Diagnosis not present

## 2019-01-06 DIAGNOSIS — E785 Hyperlipidemia, unspecified: Secondary | ICD-10-CM

## 2019-01-06 DIAGNOSIS — M159 Polyosteoarthritis, unspecified: Secondary | ICD-10-CM

## 2019-01-06 DIAGNOSIS — N183 Chronic kidney disease, stage 3 unspecified: Secondary | ICD-10-CM

## 2019-01-06 DIAGNOSIS — E1122 Type 2 diabetes mellitus with diabetic chronic kidney disease: Secondary | ICD-10-CM | POA: Diagnosis not present

## 2019-01-06 NOTE — Progress Notes (Addendum)
This service is provided via telemedicine  No vital signs collected/recorded due to the encounter was a telemedicine visit.   Location of patient (ex: home, work):  HOME  Patient consents to a telephone visit:  YES  Location of the provider (ex: office, home):  OFFICE  Name of any referring provider:  Cassidy Tabet, DO  Names of all persons participating in the telemedicine service and their role in the encounter:  [name], [place], [name], DO, PCP and Linward Foster, pt  Time spent on call:  4:24    Provider:  Kymani Shimabukuro L. Mariea Clonts, D.O., C.M.D.  Goals of Care:  Advanced Directives 10/19/2018  Does Patient Have a Medical Advance Directive? Yes  Type of Paramedic of [place];Living will  Does patient want to make changes to medical advance directive? -  Copy of [place] in Chart? No - copy requested  Would patient like information on creating a medical advance directive? -  Pre-existing out of facility DNR order (yellow form or pink MOST form) -     Chief Complaint  Patient presents with  . telephone call    54mth follow-up    HPI: Patient is a 73 y.o. male spoken with by phone for his medical management visit.    No new concerns.    Phlebotomist was unable to get his bloodwork the last time so we don't know how his sugar is doing.    hba1c was 7.2 with Dr. Forde Dandy.  He told him his cholesterol was good.  He is planning to keep a watch on his thyroid.  No f/u MRI needed to be done.    He's doing fairly well with his aches and pains.  He had been swimming and doing water aerobics at the gym.  He's out trying to walk these days.  The first time was painful, but it's getting better.  He's walking about 15 mins at a time--his aunt lives at the end of the cul[place]de[place]sac and then a brother at another cul-de-sac so he sits and takes a break at those places.   Takes the dogs for walking.    Says the side effects from allopurinol made him  feel lethargic, drowsy and dizzy so he stopped it.  He told Dr. Elisha Ponder that his gout only flares up once every 2-3 years.  No issues lately.  Neuropathy--every now and then he gets some feeling like someone is sticking his feet, but not often.  Takes his gabapentin/neurontin only if needed.    He never pulled his scale back out from when he had the house remodeled.  Weight was down a little at Dr. Baldwin Crown.    He's doing well with eating more salads--had them twice a day yesterday.  He bores with foods quickly.  He likes to try new things.    Does not see endo until next year.  Kidneys were improved at nephrologist.   For some reason neither the endocrine note that I received from Dr. Forde Dandy nor the nephrology note are scanned in his chart to review.     Past Medical History:  Diagnosis Date  . Asymptomatic varicose veins   . Chronic kidney disease   . Diaphragmatic hernia without mention of obstruction or gangrene   . DM (diabetes mellitus) ([place])   . GERD (gastroesophageal reflux disease)   . Gout, unspecified   . Hypercalcemia   . Hyperlipidemia   . Hyperosmolality and/or hypernatremia   . Hypertension   .  Monoclonal paraproteinemia   . New daily persistent headache   . Osteoarthrosis, unspecified whether generalized or localized, lower leg   . Other malaise and fatigue   . Primary localized osteoarthrosis, unspecified site   . Unspecified disorder of kidney and ureter   . Unspecified hereditary and idiopathic peripheral neuropathy     Past Surgical History:  Procedure Laterality Date  . BIOPSY  10/19/2018   Procedure: BIOPSY;  Surgeon: Ronald Lobo, MD;  Location: [facility] ENDOSCOPY;  Service: Endoscopy;;  . CARPAL TUNNEL RELEASE  2007  . COLONOSCOPY N/A 06/06/2013   Procedure: COLONOSCOPY;  Surgeon: Cleotis Nipper, MD;  Location: [facility] ENDOSCOPY;  Service: Endoscopy;  Laterality: N/A;  . COLONOSCOPY WITH PROPOFOL N/A 10/19/2018   Procedure: COLONOSCOPY WITH PROPOFOL;  Surgeon:  Ronald Lobo, MD;  Location: [facility] ENDOSCOPY;  Service: Endoscopy;  Laterality: N/A;  . POLYPECTOMY  10/19/2018   Procedure: POLYPECTOMY;  Surgeon: Ronald Lobo, MD;  Location: [facility] ENDOSCOPY;  Service: Endoscopy;;  . TUMOR REMOVAL  2017   Pituitary adenoma    No Known Allergies  Outpatient Encounter Medications as of 01/06/2019  Medication Sig  . allopurinol (ZYLOPRIM) 100 MG tablet Take 1 tablet (100 mg total) by mouth daily.  Marland Kitchen amLODipine (NORVASC) 10 MG tablet TAKE 1 TABLET DAILY FOR BLOOD PRESSURE  . aspirin EC 81 MG tablet Take 1 tablet (81 mg total) by mouth daily.  Marland Kitchen atorvastatin (LIPITOR) 40 MG tablet TAKE 1 TABLET DAILY  . chlorthalidone (HYGROTON) 25 MG tablet TAKE 1 TABLET DAILY FOR BLOOD PRESSURE  . Cholecalciferol (VITAMIN D3) 2000 units TABS Take 2,000 Units by mouth daily.   Marland Kitchen FREESTYLE LITE test strip CHECK BLOOD SUGAR THREE TIMES A DAY  . gabapentin (NEURONTIN) 300 MG capsule Take 1 capsule (300 mg total) by mouth 3 (three) times daily.  . Insulin Glargine (LANTUS SOLOSTAR) 100 UNIT/ML Solostar Pen Inject 66 Units into the skin daily.  . insulin lispro (HUMALOG KWIKPEN) 100 UNIT/ML KiwkPen Inject 22 units before breakfast, 26 units before lunch & supper - plus sliding scale.  . Insulin Pen Needle (SURE COMFORT PEN NEEDLES) 32G X 4 MM MISC Use 4 times daily before meals DX: E11.65  . KLOR-CON M20 20 MEQ tablet TAKE 1 TABLET TWICE A DAY  . losartan (COZAAR) 100 MG tablet TAKE 1 TABLET DAILY  . Omega-3 Fatty Acids (FISH OIL) 1200 MG CAPS Take 1,200 mg by mouth daily.   . pantoprazole (PROTONIX) 40 MG tablet TAKE 1 TABLET DAILY  . [DISCONTINUED] Iron-FA-B Cmp-C-Biot-Probiotic (FUSION PLUS) CAPS TAKE 1 CAPSULE EVERY OTHER DAY   No facility-administered encounter medications on file as of 01/06/2019.     Review of Systems:  Review of Systems  Constitutional: Positive for malaise/fatigue. Negative for chills and fever.       Reports weight down a few lbs at endocrine appt  (had been trending up for several visits prior to this)  HENT: Negative for congestion and hearing loss.   Eyes: Negative for blurred vision.  Respiratory: Negative for cough and shortness of breath.   Cardiovascular: Negative for chest pain, palpitations and leg swelling.  Gastrointestinal: Negative for abdominal pain, blood in stool, constipation, diarrhea and melena.  Genitourinary: Negative for dysuria.  Musculoskeletal: Positive for joint pain. Negative for falls.       Not bothersome at present  Skin: Negative for itching and rash.  Neurological: Positive for sensory change. Negative for dizziness, tingling and loss of consciousness.  Psychiatric/Behavioral: Negative for depression and memory loss. The  patient is not nervous/anxious and does not have insomnia.     Health Maintenance  Topic Date Due  . Hepatitis C Screening  08/31/2019 (Originally 01-04-1946)  . HEMOGLOBIN A1C  02/24/2019  . OPHTHALMOLOGY EXAM  07/15/2019  . FOOT EXAM  08/31/2019  . TETANUS/TDAP  04/13/2023  . COLONOSCOPY  10/19/2028  . INFLUENZA VACCINE  Completed  . PNA vac Low Risk Adult  Completed    Physical Exam: There were no vitals filed for this visit. There is no height or weight on file to calculate BMI. Physical Exam Could not be performed because visit done by telephone  Labs reviewed: Basic Metabolic Panel: Recent Labs    02/04/18 0845 08/26/18 0806  NA 137 139  K 3.7 4.3  CL 99 99  CO2 31 28  GLUCOSE 177* 128*  BUN 21 20  CREATININE 1.29* 1.35*  CALCIUM 9.8 9.9   Liver Function Tests: Recent Labs    02/04/18 0845 08/26/18 0806  AST 15 27  ALT 14 15  BILITOT 0.3 0.4  PROT 7.1 7.6   No results for input(s): LIPASE, AMYLASE in the last 8760 hours. No results for input(s): AMMONIA in the last 8760 hours. CBC: Recent Labs    08/26/18 0806  WBC 9.9  NEUTROABS 5,198  HGB 12.4*  HCT 39.3  MCV 79.7*  PLT 218   Lipid Panel: Recent Labs    02/04/18 0845 08/26/18 0806   CHOL 149 166  HDL 45 44  LDLCALC 87 103*  TRIG 80 92  CHOLHDL 3.3 3.8   Lab Results  Component Value Date   HGBA1C 7.4 (H) 08/26/2018   Assessment/Plan 1. Type 2 diabetes mellitus with stage 3 chronic kidney disease, with long-term current use of insulin ([place]) - reports hba1c was good at endocrinology--note apparently still not scanned in record ( I know I saw it and sent it to be scanned - Basic metabolic panel; Future - Hemoglobin A1c; Future -cont current insulin regimen, improved diet, regular walking for exercise  2. Hyperlipidemia associated with type 2 diabetes mellitus ([place]) - cont lipitor therapy, nov labs with LDL above goal at 103 (should be less than 70) but reportedly was good with endocrine? - Basic metabolic panel; Future  3. Class 3 severe obesity with serious comorbidity and body mass index (BMI) of 50.0 to 59.9 in adult, unspecified obesity type ([place]) -ongoing, we don't have a current weight--he is going to pull out his home scale but has not yet done this and visit done by phone - Basic metabolic panel; Future  4. Pituitary microadenoma with hyperprolactinemia ([place]) -cont to follow with endocrine--he reports that a repeat MRI was not needed - Basic metabolic panel; Future  5. Chronic kidney disease, stage 3 ([facility]) - ongoing, but he also reports this was better at nephrology--again notes that I've signed off on not scanned at HIM  - Basic metabolic panel; Future  6. Generalized osteoarthritis of multiple sites -improved lately, cont regular walking and gradually increase time on feet  7. Type 2 diabetes mellitus with diabetic neuropathy, with long-term current use of insulin ([facility]) -neuropathy not bothersome enough for him to regularly take his gabapentin -control has been good   Labs/tests ordered:   Orders Placed This Encounter  Procedures  . Basic metabolic panel    Standing Status:   Future    Standing Expiration Date:   01/06/2020    Order Specific  Question:   Has the patient fasted?    Answer:  Yes  . Hemoglobin A1c    Standing Status:   Future    Standing Expiration Date:   01/06/2020    Next appt:  05/12/2019 for med mgt, fasting labs before  15 minutes of non face to face time spent on televisit with patient  Uriel Dowding L. Zabian Swayne, D.O. [facility] [address], [place] [address] Cell Phone (Mon-Fri 8am-5pm):  [phone] On Call:  [phone] & follow prompts after 5pm & weekends Office Phone:  [phone] Office Fax:  [phone]

## 2019-01-21 ENCOUNTER — Ambulatory Visit: Payer: Medicare Other | Admitting: Podiatry

## 2019-01-26 ENCOUNTER — Ambulatory Visit: Payer: Medicare Other | Admitting: Podiatry

## 2019-02-06 ENCOUNTER — Encounter: Payer: Self-pay | Admitting: Internal Medicine

## 2019-02-07 DIAGNOSIS — Z8639 Personal history of other endocrine, nutritional and metabolic disease: Secondary | ICD-10-CM | POA: Diagnosis not present

## 2019-02-07 DIAGNOSIS — N189 Chronic kidney disease, unspecified: Secondary | ICD-10-CM | POA: Diagnosis not present

## 2019-02-07 DIAGNOSIS — N183 Chronic kidney disease, stage 3 (moderate): Secondary | ICD-10-CM | POA: Diagnosis not present

## 2019-02-14 DIAGNOSIS — I129 Hypertensive chronic kidney disease with stage 1 through stage 4 chronic kidney disease, or unspecified chronic kidney disease: Secondary | ICD-10-CM | POA: Diagnosis not present

## 2019-02-14 DIAGNOSIS — N183 Chronic kidney disease, stage 3 (moderate): Secondary | ICD-10-CM | POA: Diagnosis not present

## 2019-02-14 DIAGNOSIS — Z8639 Personal history of other endocrine, nutritional and metabolic disease: Secondary | ICD-10-CM | POA: Diagnosis not present

## 2019-02-14 DIAGNOSIS — D631 Anemia in chronic kidney disease: Secondary | ICD-10-CM | POA: Diagnosis not present

## 2019-02-18 ENCOUNTER — Ambulatory Visit: Payer: Medicare Other | Admitting: Podiatry

## 2019-02-18 ENCOUNTER — Other Ambulatory Visit: Payer: Self-pay | Admitting: *Deleted

## 2019-02-18 MED ORDER — INSULIN LISPRO (1 UNIT DIAL) 100 UNIT/ML (KWIKPEN): PEN_INJECTOR | SUBCUTANEOUS | 1 refills | Status: DC

## 2019-02-18 NOTE — Telephone Encounter (Signed)
Patient requested refill to be sent to [facility].  Pended Rx and sent to Dr. Mariea Clonts for approval due to [place] Warning.

## 2019-02-21 ENCOUNTER — Other Ambulatory Visit: Payer: Self-pay | Admitting: Internal Medicine

## 2019-02-21 ENCOUNTER — Other Ambulatory Visit: Payer: Self-pay | Admitting: *Deleted

## 2019-02-21 MED ORDER — INSULIN LISPRO (1 UNIT DIAL) 100 UNIT/ML (KWIKPEN): PEN_INJECTOR | SUBCUTANEOUS | 1 refills | Status: DC

## 2019-02-21 NOTE — Telephone Encounter (Signed)
[facility] 

## 2019-02-22 ENCOUNTER — Other Ambulatory Visit: Payer: Self-pay | Admitting: *Deleted

## 2019-02-22 MED ORDER — INSULIN LISPRO (1 UNIT DIAL) 100 UNIT/ML (KWIKPEN): PEN_INJECTOR | SUBCUTANEOUS | 3 refills | Status: DC

## 2019-02-22 NOTE — Telephone Encounter (Signed)
Patient called and stated that he needed 25 pens/90 day supply sent to his mail order, [facility] with 3 refills to last him a year. Rx faxed.

## 2019-03-31 ENCOUNTER — Other Ambulatory Visit: Payer: Self-pay | Admitting: Internal Medicine

## 2019-04-09 ENCOUNTER — Other Ambulatory Visit: Payer: Self-pay | Admitting: Internal Medicine

## 2019-04-09 DIAGNOSIS — E876 Hypokalemia: Secondary | ICD-10-CM

## 2019-04-12 ENCOUNTER — Telehealth: Payer: Self-pay | Admitting: *Deleted

## 2019-04-12 NOTE — Telephone Encounter (Signed)
Patient called back and stated never mind on medication. Stated that he still had 3 months worth and did not want to change at this time.   DISREGARD MESSAGE.

## 2019-04-12 NOTE — Telephone Encounter (Signed)
[facility] cannot get patient's Losartan 100mg . They are on backorder. Requesting something different to be called in. Please Advise.   Pended and sent to Dr. Sheppard Coil due to Dr. Mariea Clonts out of office.

## 2019-04-27 ENCOUNTER — Other Ambulatory Visit: Payer: Self-pay | Admitting: Internal Medicine

## 2019-04-27 DIAGNOSIS — E0822 Diabetes mellitus due to underlying condition with diabetic chronic kidney disease: Secondary | ICD-10-CM

## 2019-05-02 ENCOUNTER — Other Ambulatory Visit: Payer: Self-pay | Admitting: Internal Medicine

## 2019-05-05 ENCOUNTER — Other Ambulatory Visit: Payer: Medicare Other

## 2019-05-05 ENCOUNTER — Other Ambulatory Visit: Payer: Self-pay

## 2019-05-05 DIAGNOSIS — Z794 Long term (current) use of insulin: Secondary | ICD-10-CM

## 2019-05-05 DIAGNOSIS — E1122 Type 2 diabetes mellitus with diabetic chronic kidney disease: Secondary | ICD-10-CM | POA: Diagnosis not present

## 2019-05-05 DIAGNOSIS — E1169 Type 2 diabetes mellitus with other specified complication: Secondary | ICD-10-CM

## 2019-05-05 DIAGNOSIS — N183 Chronic kidney disease, stage 3 unspecified: Secondary | ICD-10-CM

## 2019-05-05 DIAGNOSIS — E229 Hyperfunction of pituitary gland, unspecified: Secondary | ICD-10-CM

## 2019-05-05 DIAGNOSIS — D352 Benign neoplasm of pituitary gland: Secondary | ICD-10-CM | POA: Diagnosis not present

## 2019-05-05 DIAGNOSIS — Z6841 Body Mass Index (BMI) 40.0 and over, adult: Secondary | ICD-10-CM | POA: Diagnosis not present

## 2019-05-05 DIAGNOSIS — E66813 Obesity, class 3: Secondary | ICD-10-CM

## 2019-05-05 DIAGNOSIS — E785 Hyperlipidemia, unspecified: Secondary | ICD-10-CM

## 2019-05-06 ENCOUNTER — Encounter: Payer: Self-pay | Admitting: *Deleted

## 2019-05-06 LAB — BASIC METABOLIC PANEL
BUN/Creatinine Ratio: 17 (calc) (ref 6–22)
BUN: 23 mg/dL (ref 7–25)
CO2: 28 mmol/L (ref 20–32)
Calcium: 9.8 mg/dL (ref 8.6–10.3)
Chloride: 99 mmol/L (ref 98–110)
Creat: 1.35 mg/dL — ABNORMAL HIGH (ref 0.70–1.18)
Glucose, Bld: 143 mg/dL — ABNORMAL HIGH (ref 65–99)
Potassium: 3.6 mmol/L (ref 3.5–5.3)
Sodium: 137 mmol/L (ref 135–146)

## 2019-05-06 LAB — HEMOGLOBIN A1C
Hgb A1c MFr Bld: 7.3 % of total Hgb — ABNORMAL HIGH (ref <5.7)
Mean Plasma Glucose: 163 (calc)
eAG (mmol/L): 9 (calc)

## 2019-05-12 ENCOUNTER — Other Ambulatory Visit: Payer: Self-pay

## 2019-05-12 ENCOUNTER — Ambulatory Visit (INDEPENDENT_AMBULATORY_CARE_PROVIDER_SITE_OTHER): Payer: Medicare Other | Admitting: Internal Medicine

## 2019-05-12 ENCOUNTER — Encounter: Payer: Self-pay | Admitting: Internal Medicine

## 2019-05-12 DIAGNOSIS — D352 Benign neoplasm of pituitary gland: Secondary | ICD-10-CM

## 2019-05-12 DIAGNOSIS — N183 Chronic kidney disease, stage 3 unspecified: Secondary | ICD-10-CM

## 2019-05-12 DIAGNOSIS — M159 Polyosteoarthritis, unspecified: Secondary | ICD-10-CM | POA: Diagnosis not present

## 2019-05-12 DIAGNOSIS — E1169 Type 2 diabetes mellitus with other specified complication: Secondary | ICD-10-CM

## 2019-05-12 DIAGNOSIS — Z794 Long term (current) use of insulin: Secondary | ICD-10-CM | POA: Diagnosis not present

## 2019-05-12 DIAGNOSIS — E785 Hyperlipidemia, unspecified: Secondary | ICD-10-CM | POA: Diagnosis not present

## 2019-05-12 DIAGNOSIS — E1122 Type 2 diabetes mellitus with diabetic chronic kidney disease: Secondary | ICD-10-CM | POA: Diagnosis not present

## 2019-05-12 DIAGNOSIS — Z6841 Body Mass Index (BMI) 40.0 and over, adult: Secondary | ICD-10-CM

## 2019-05-12 DIAGNOSIS — E229 Hyperfunction of pituitary gland, unspecified: Secondary | ICD-10-CM | POA: Diagnosis not present

## 2019-05-12 NOTE — Progress Notes (Addendum)
Patient ID: Adrian Matthews, male   DOB: [birth year], 73 y.o.   MRN: [id number] This service is provided via telemedicine  No vital signs collected/recorded due to the encounter was a telemedicine visit.   Location of patient (ex: home, work):  home  Patient consents to a telephone visit:  yes  Location of the provider (ex: office, home):  office  Name of any referring provider:  Dr. Hollace Kinnier, DO  Names of all persons participating in the telemedicine service and their role in the encounter:  Patient, [name], CMA, Dr. Hollace Kinnier, DO  Time spent on call:  3:58    Location:  [facility] clinic Provider:  Kaianna Dolezal L. Mariea Clonts, D.O., C.M.D.  Goals of Care:  Advanced Directives 10/19/2018  Does Patient Have a Medical Advance Directive? Yes  Type of Paramedic of [place];Living will  Does patient want to make changes to medical advance directive? -  Copy of [place] in Chart? No - copy requested  Would patient like information on creating a medical advance directive? -  Pre-existing out of facility DNR order (yellow form or pink MOST form) -     Chief Complaint  Patient presents with  . Medical Management of Chronic Issues    4MTH FOLLOW-UP    HPI: Patient is a 73 y.o. male seen today for medical management of chronic diseases.    He's already taken his walk with the dogs.  Arthritis in his knees worse with walking vs going to the pool.  He's dealing with it.  Takes 1/2 pill and manageable with gabapentin.    Sugar average 7.3 which is an improvement.   Kidneys stable.   He expects his bp and hba1c will be even better.  He's eating more veggies and salads and Dr. Posey Pronto recommended bananas and pineapple--has one or the other daily at lunch or snack.    He talks with friends on the phone.    His allergies act up outside.    Only goes to the grocery store ([employer] or [employer]) once a month and [employer] monthly.    Visits brother two  streets over once in a while.  He has a 90 day supply of his losartan so he's waiting to see if pharmacy gets it in stock.  It's been backordered.    Past Medical History:  Diagnosis Date  . Asymptomatic varicose veins   . Chronic kidney disease   . Diaphragmatic hernia without mention of obstruction or gangrene   . DM (diabetes mellitus) ([place])   . GERD (gastroesophageal reflux disease)   . Gout, unspecified   . Hypercalcemia   . Hyperlipidemia   . Hyperosmolality and/or hypernatremia   . Hypertension   . Monoclonal paraproteinemia   . New daily persistent headache   . Osteoarthrosis, unspecified whether generalized or localized, lower leg   . Other malaise and fatigue   . Primary localized osteoarthrosis, unspecified site   . Unspecified disorder of kidney and ureter   . Unspecified hereditary and idiopathic peripheral neuropathy     Past Surgical History:  Procedure Laterality Date  . BIOPSY  10/19/2018   Procedure: BIOPSY;  Surgeon: Ronald Lobo, MD;  Location: [facility] ENDOSCOPY;  Service: Endoscopy;;  . CARPAL TUNNEL RELEASE  2007  . COLONOSCOPY N/A 06/06/2013   Procedure: COLONOSCOPY;  Surgeon: Cleotis Nipper, MD;  Location: [facility] ENDOSCOPY;  Service: Endoscopy;  Laterality: N/A;  . COLONOSCOPY WITH PROPOFOL N/A 10/19/2018   Procedure: COLONOSCOPY WITH PROPOFOL;  Surgeon: Ronald Lobo, MD;  Location: [facility];  Service: Endoscopy;  Laterality: N/A;  . POLYPECTOMY  10/19/2018   Procedure: POLYPECTOMY;  Surgeon: Ronald Lobo, MD;  Location: [facility] ENDOSCOPY;  Service: Endoscopy;;  . TUMOR REMOVAL  2017   Pituitary adenoma    No Known Allergies  Outpatient Encounter Medications as of 05/12/2019  Medication Sig  . allopurinol (ZYLOPRIM) 100 MG tablet Take 1 tablet (100 mg total) by mouth daily.  Marland Kitchen amLODipine (NORVASC) 10 MG tablet TAKE 1 TABLET DAILY FOR BLOOD PRESSURE  . aspirin EC 81 MG tablet Take 1 tablet (81 mg total) by mouth daily.  Marland Kitchen atorvastatin (LIPITOR) 40 MG  tablet TAKE 1 TABLET DAILY  . chlorthalidone (HYGROTON) 25 MG tablet TAKE 1 TABLET DAILY FOR BLOOD PRESSURE  . Cholecalciferol (VITAMIN D3) 2000 units TABS Take 2,000 Units by mouth daily.   Marland Kitchen FREESTYLE LITE test strip CHECK BLOOD SUGAR THREE TIMES A DAY  . gabapentin (NEURONTIN) 300 MG capsule Take 1 capsule (300 mg total) by mouth 3 (three) times daily.  . insulin lispro (HUMALOG KWIKPEN) 100 UNIT/ML KwikPen Inject 22 units before breakfast, 26 units before lunch & supper-plus sliding scale  . Insulin Pen Needle (SURE COMFORT PEN NEEDLES) 32G X 4 MM MISC Use 4 times daily before meals DX: E11.65  . LANTUS SOLOSTAR 100 UNIT/ML Solostar Pen INJECT 66 UNITS UNDER THE SKIN DAILY  . losartan (COZAAR) 100 MG tablet TAKE 1 TABLET DAILY  . Omega-3 Fatty Acids (FISH OIL) 1200 MG CAPS Take 1,200 mg by mouth daily.   . pantoprazole (PROTONIX) 40 MG tablet TAKE 1 TABLET DAILY  . potassium chloride SA (K-DUR) 20 MEQ tablet TAKE 1 TABLET TWICE A DAY   No facility-administered encounter medications on file as of 05/12/2019.     Review of Systems:  Review of Systems  Constitutional: Negative for chills, fever and malaise/fatigue.  HENT: Negative for congestion and hearing loss.   Eyes: Positive for photophobia. Negative for blurred vision.  Respiratory: Negative for cough and shortness of breath.   Cardiovascular: Negative for chest pain and leg swelling.  Gastrointestinal: Negative for abdominal pain, blood in stool, constipation, diarrhea and melena.  Genitourinary: Negative for dysuria.  Musculoskeletal: Negative for falls and joint pain.  Skin: Negative for itching and rash.  Neurological: Negative for dizziness and loss of consciousness.  Endo/Heme/Allergies: Does not bruise/bleed easily.  Psychiatric/Behavioral: Negative for depression and memory loss. The patient does not have insomnia.     Health Maintenance  Topic Date Due  . Hepatitis C Screening  08/31/2019 (Originally Jun 09, 1946)  .  INFLUENZA VACCINE  05/14/2019  . OPHTHALMOLOGY EXAM  07/15/2019  . FOOT EXAM  08/31/2019  . HEMOGLOBIN A1C  11/05/2019  . TETANUS/TDAP  04/13/2023  . COLONOSCOPY  10/19/2028  . PNA vac Low Risk Adult  Completed    Physical Exam: Cannot be performed due to phone visit  Labs reviewed: Basic Metabolic Panel: Recent Labs    08/26/18 0806 05/05/19 0819  NA 139 137  K 4.3 3.6  CL 99 99  CO2 28 28  GLUCOSE 128* 143*  BUN 20 23  CREATININE 1.35* 1.35*  CALCIUM 9.9 9.8   Liver Function Tests: Recent Labs    08/26/18 0806  AST 27  ALT 15  BILITOT 0.4  PROT 7.6   No results for input(s): LIPASE, AMYLASE in the last 8760 hours. No results for input(s): AMMONIA in the last 8760 hours. CBC: Recent Labs    08/26/18 0806  WBC 9.9  NEUTROABS 5,198  HGB 12.4*  HCT 39.3  MCV 79.7*  PLT 218   Lipid Panel: Recent Labs    08/26/18 0806  CHOL 166  HDL 44  LDLCALC 103*  TRIG 92  CHOLHDL 3.8   Lab Results  Component Value Date   HGBA1C 7.3 (H) 05/05/2019   Assessment/Plan 1. Type 2 diabetes mellitus with stage 3 chronic kidney disease, with long-term current use of insulin (HCC) -cont ARB, lantus 66 units daily, lipitor, baby asa -control has improved with walking and better diet while socially isolated--educated and encouraged to keep up the good work  2. Hyperlipidemia associated with type 2 diabetes mellitus (HCC) -cont statin therapy and monitor FLP  3. Class 3 severe obesity with serious comorbidity and body mass index (BMI) of 50.0 to 59.9 in adult, unspecified obesity type ([place]) -ongoing, continue walking program, get back to water aerobics when it's a safe option  4. Pituitary microadenoma with hyperprolactinemia (HCC) -s/p resection, monitored by endocrine, no new symptoms, not here in the office so weight not known  5. Chronic kidney disease, stage 3 (HCC) -Avoid nephrotoxic agents like nsaids, dose adjust renally excreted meds, hydrate. -follows with  Dr. Posey Pronto and getting potassium repleted--takes pineapple and banana also for this  6. Generalized osteoarthritis of multiple sites -a bit worse with his walking vs water aerobics, but tolerable with 1/2 gabapentin (seems that is more helpful for neuropathic pain, but he's attributing benefit for OA)  Labs/tests ordered:  Lab Orders     CBC with Differential/Platelet     BASIC METABOLIC PANEL WITH GFR     Lipid panel     Hemoglobin A1c   Next appt:  09/15/2019 with fasting labs before Time spent:  28 mins of non face to face time  Shakira Los L. Vanna Shavers, D.O. [facility] [address],  [address] Cell Phone (Mon-Fri 8am-5pm):  [phone] On Call:  [phone] & follow prompts after 5pm & weekends Office Phone:  [phone] Office Fax:  [phone]

## 2019-05-23 ENCOUNTER — Ambulatory Visit (INDEPENDENT_AMBULATORY_CARE_PROVIDER_SITE_OTHER): Payer: Medicare Other | Admitting: Podiatry

## 2019-05-23 ENCOUNTER — Other Ambulatory Visit: Payer: Self-pay

## 2019-05-23 ENCOUNTER — Encounter: Payer: Self-pay | Admitting: Podiatry

## 2019-05-23 VITALS — Temp 97.8°F

## 2019-05-23 DIAGNOSIS — M79675 Pain in left toe(s): Secondary | ICD-10-CM | POA: Diagnosis not present

## 2019-05-23 DIAGNOSIS — E1142 Type 2 diabetes mellitus with diabetic polyneuropathy: Secondary | ICD-10-CM | POA: Diagnosis not present

## 2019-05-23 DIAGNOSIS — B351 Tinea unguium: Secondary | ICD-10-CM | POA: Diagnosis not present

## 2019-05-23 DIAGNOSIS — M79674 Pain in right toe(s): Secondary | ICD-10-CM

## 2019-05-23 NOTE — Progress Notes (Signed)
Subjective: Adrian Matthews is a 73 y.o. y.o. male who presents today for follow up with cc of painful, discolored, thick toenails which interfere with daily activities. Pain is aggravated when wearing enclosed shoe gear and relieved with periodic professional debridement.  He states he has been taking allopurinol, but he does not take it daily.  He may take it 2-3 days out of a week. "I don't like to take medication."  [name], DO is his PCP.   Current Outpatient Medications:  .  allopurinol (ZYLOPRIM) 100 MG tablet, Take 1 tablet (100 mg total) by mouth daily., Disp: 90 tablet, Rfl: 3 .  amLODipine (NORVASC) 10 MG tablet, TAKE 1 TABLET DAILY FOR BLOOD PRESSURE, Disp: 90 tablet, Rfl: 3 .  aspirin EC 81 MG tablet, Take 1 tablet (81 mg total) by mouth daily., Disp: 30 tablet, Rfl: 11 .  atorvastatin (LIPITOR) 40 MG tablet, TAKE 1 TABLET DAILY, Disp: 90 tablet, Rfl: 3 .  chlorthalidone (HYGROTON) 25 MG tablet, TAKE 1 TABLET DAILY FOR BLOOD PRESSURE, Disp: 90 tablet, Rfl: 3 .  Cholecalciferol (VITAMIN D3) 2000 units TABS, Take 2,000 Units by mouth daily. , Disp: , Rfl:  .  FREESTYLE LITE test strip, CHECK BLOOD SUGAR THREE TIMES A DAY, Disp: 300 each, Rfl: 3 .  gabapentin (NEURONTIN) 300 MG capsule, Take 1 capsule (300 mg total) by mouth 3 (three) times daily., Disp: 90 capsule, Rfl: 11 .  insulin lispro (HUMALOG KWIKPEN) 100 UNIT/ML KwikPen, Inject 22 units before breakfast, 26 units before lunch & supper-plus sliding scale, Disp: 25 pen, Rfl: 3 .  Insulin Pen Needle (SURE COMFORT PEN NEEDLES) 32G X 4 MM MISC, Use 4 times daily before meals DX: E11.65, Disp: 500 each, Rfl: 1 .  LANTUS SOLOSTAR 100 UNIT/ML Solostar Pen, INJECT 66 UNITS UNDER THE SKIN DAILY, Disp: 60 mL, Rfl: 3 .  losartan (COZAAR) 100 MG tablet, TAKE 1 TABLET DAILY, Disp: 90 tablet, Rfl: 3 .  Omega-3 Fatty Acids (FISH OIL) 1200 MG CAPS, Take 1,200 mg by mouth daily. , Disp: , Rfl:  .  pantoprazole (PROTONIX) 40 MG tablet,  TAKE 1 TABLET DAILY, Disp: 90 tablet, Rfl: 4 .  potassium chloride SA (K-DUR) 20 MEQ tablet, TAKE 1 TABLET TWICE A DAY, Disp: 180 tablet, Rfl: 3  No Known Allergies  Objective: Vitals:   05/23/19 0841  Temp: 97.8 F (36.6 C)    Vascular Examination: Capillary refill time <3 seconds x 10 digits.  Dorsalis pedis pulses present b/l.  Posterior tibial pulses present b/l.  Digital hair absent x 10 digits.  Skin temperature gradient WNL b/l.  Dermatological Examination: Skin with normal turgor, texture and tone b/l.  Toenails 1-5 b/l discolored, thick, dystrophic with subungual debris and pain with palpation to nailbeds due to thickness of nails.  Musculoskeletal: Muscle strength 5/5 to all LE muscle groups b/l.  Neurological: Sensation absent with 10 gram monofilament.  Vibratory sensation absent b/l.  Assessment: 1. Painful onychomycosis toenails 1-5 b/l 2.   NIDDM with neuropathy  Plan: 1. Continue diabetic foot care principles. Literature dispensed on today. 2. Toenails 1-5 b/l were debrided in length and girth without iatrogenic bleeding. 3. Encouraged him to take his allopurinol as directed, but he states it makes him sleepy. 4. Patient to continue soft, supportive shoe gear daily. 5. Patient to report any pedal injuries to medical professional immediately. 6. Follow up 3 months.  7. Patient/POA to call should there be a concern in the interim.

## 2019-05-23 NOTE — Patient Instructions (Signed)
Diabetes Mellitus and Foot Care Foot care is an important part of your health, especially when you have diabetes. Diabetes may cause you to have problems because of poor blood flow (circulation) to your feet and legs, which can cause your skin to:  Become thinner and drier.  Break more easily.  Heal more slowly.  Peel and crack. You may also have nerve damage (neuropathy) in your legs and feet, causing decreased feeling in them. This means that you may not notice minor injuries to your feet that could lead to more serious problems. Noticing and addressing any potential problems early is the best way to prevent future foot problems. How to care for your feet Foot hygiene  Wash your feet daily with warm water and mild soap. Do not use hot water. Then, pat your feet and the areas between your toes until they are completely dry. Do not soak your feet as this can dry your skin.  Trim your toenails straight across. Do not dig under them or around the cuticle. File the edges of your nails with an emery board or nail file.  Apply a moisturizing lotion or petroleum jelly to the skin on your feet and to dry, brittle toenails. Use lotion that does not contain alcohol and is unscented. Do not apply lotion between your toes. Shoes and socks  Wear clean socks or stockings every day. Make sure they are not too tight. Do not wear knee-high stockings since they may decrease blood flow to your legs.  Wear shoes that fit properly and have enough cushioning. Always look in your shoes before you put them on to be sure there are no objects inside.  To break in new shoes, wear them for just a few hours a day. This prevents injuries on your feet. Wounds, scrapes, corns, and calluses  Check your feet daily for blisters, cuts, bruises, sores, and redness. If you cannot see the bottom of your feet, use a mirror or ask someone for help.  Do not cut corns or calluses or try to remove them with medicine.  If you  find a minor scrape, cut, or break in the skin on your feet, keep it and the skin around it clean and dry. You may clean these areas with mild soap and water. Do not clean the area with peroxide, alcohol, or iodine.  If you have a wound, scrape, corn, or callus on your foot, look at it several times a day to make sure it is healing and not infected. Check for: ? Redness, swelling, or pain. ? Fluid or blood. ? Warmth. ? Pus or a bad smell. General instructions  Do not cross your legs. This may decrease blood flow to your feet.  Do not use heating pads or hot water bottles on your feet. They may burn your skin. If you have lost feeling in your feet or legs, you may not know this is happening until it is too late.  Protect your feet from hot and cold by wearing shoes, such as at the beach or on hot pavement.  Schedule a complete foot exam at least once a year (annually) or more often if you have foot problems. If you have foot problems, report any cuts, sores, or bruises to your health care provider immediately. Contact a health care provider if:  You have a medical condition that increases your risk of infection and you have any cuts, sores, or bruises on your feet.  You have an injury that is not   healing.  You have redness on your legs or feet.  You feel burning or tingling in your legs or feet.  You have pain or cramps in your legs and feet.  Your legs or feet are numb.  Your feet always feel cold.  You have pain around a toenail. Get help right away if:  You have a wound, scrape, corn, or callus on your foot and: ? You have pain, swelling, or redness that gets worse. ? You have fluid or blood coming from the wound, scrape, corn, or callus. ? Your wound, scrape, corn, or callus feels warm to the touch. ? You have pus or a bad smell coming from the wound, scrape, corn, or callus. ? You have a fever. ? You have a red line going up your leg. Summary  Check your feet every day  for cuts, sores, red spots, swelling, and blisters.  Moisturize feet and legs daily.  Wear shoes that fit properly and have enough cushioning.  If you have foot problems, report any cuts, sores, or bruises to your health care provider immediately.  Schedule a complete foot exam at least once a year (annually) or more often if you have foot problems. This information is not intended to replace advice given to you by your health care provider. Make sure you discuss any questions you have with your health care provider. Document Released: 09/26/2000 Document Revised: 11/11/2017 Document Reviewed: 10/31/2016 Elsevier Patient Education  2020 [employer]   Onychomycosis/Fungal Toenails  WHAT IS IT? An infection that lies within the keratin of your nail plate that is caused by a fungus.  WHY ME? Fungal infections affect all ages, sexes, races, and creeds.  There may be many factors that predispose you to a fungal infection such as age, coexisting medical conditions such as diabetes, or an autoimmune disease; stress, medications, fatigue, genetics, etc.  Bottom line: fungus thrives in a warm, moist environment and your shoes offer such a location.  IS IT CONTAGIOUS? Theoretically, yes.  You do not want to share shoes, nail clippers or files with someone who has fungal toenails.  Walking around barefoot in the same room or sleeping in the same bed is unlikely to transfer the organism.  It is important to realize, however, that fungus can spread easily from one nail to the next on the same foot.  HOW DO WE TREAT THIS?  There are several ways to treat this condition.  Treatment may depend on many factors such as age, medications, pregnancy, liver and kidney conditions, etc.  It is best to ask your doctor which options are available to you.  1. No treatment.   Unlike many other medical concerns, you can live with this condition.  However for many people this can be a painful condition and may lead to  ingrown toenails or a bacterial infection.  It is recommended that you keep the nails cut short to help reduce the amount of fungal nail. 2. Topical treatment.  These range from herbal remedies to prescription strength nail lacquers.  About 40-50% effective, topicals require twice daily application for approximately 9 to 12 months or until an entirely new nail has grown out.  The most effective topicals are medical grade medications available through physicians offices. 3. Oral antifungal medications.  With an 80-90% cure rate, the most common oral medication requires 3 to 4 months of therapy and stays in your system for a year as the new nail grows out.  Oral antifungal medications do require   blood work to make sure it is a safe drug for you.  A liver function panel will be performed prior to starting the medication and after the first month of treatment.  It is important to have the blood work performed to avoid any harmful side effects.  In general, this medication safe but blood work is required. 4. Laser Therapy.  This treatment is performed by applying a specialized laser to the affected nail plate.  This therapy is noninvasive, fast, and non-painful.  It is not covered by insurance and is therefore, out of pocket.  The results have been very good with a 80-95% cure rate.  The [facility] is the only practice in the area to offer this therapy. 5. Permanent Nail Avulsion.  Removing the entire nail so that a new nail will not grow back. 

## 2019-07-18 DIAGNOSIS — E119 Type 2 diabetes mellitus without complications: Secondary | ICD-10-CM | POA: Diagnosis not present

## 2019-07-18 DIAGNOSIS — H52203 Unspecified astigmatism, bilateral: Secondary | ICD-10-CM | POA: Diagnosis not present

## 2019-07-18 LAB — HM DIABETES EYE EXAM

## 2019-07-19 ENCOUNTER — Ambulatory Visit: Payer: Medicare Other

## 2019-07-19 ENCOUNTER — Encounter: Payer: Self-pay | Admitting: *Deleted

## 2019-07-20 ENCOUNTER — Other Ambulatory Visit: Payer: Self-pay

## 2019-07-20 ENCOUNTER — Ambulatory Visit (INDEPENDENT_AMBULATORY_CARE_PROVIDER_SITE_OTHER): Payer: Medicare Other

## 2019-07-20 DIAGNOSIS — Z23 Encounter for immunization: Secondary | ICD-10-CM

## 2019-08-15 ENCOUNTER — Other Ambulatory Visit: Payer: Self-pay | Admitting: Internal Medicine

## 2019-08-15 ENCOUNTER — Telehealth: Payer: Self-pay | Admitting: *Deleted

## 2019-08-15 DIAGNOSIS — I10 Essential (primary) hypertension: Secondary | ICD-10-CM

## 2019-08-15 MED ORDER — IRBESARTAN 150 MG PO TABS: 150.0000 mg | ORAL_TABLET | Freq: Every day | ORAL | 3 refills | Status: DC

## 2019-08-15 NOTE — Progress Notes (Signed)
I have sent irbesartan 150mg  daily [facility]

## 2019-08-15 NOTE — Telephone Encounter (Signed)
I sent irbesartan 150mg  daily to [facility] to replace losartan 100mg .  This is not a stronger pill, they just come in different mg options

## 2019-08-15 NOTE — Telephone Encounter (Signed)
Patient notified and agreed.  

## 2019-08-15 NOTE — Telephone Encounter (Signed)
Patient called and stated that his Losartan 100mg  is on backorder and he only has 10 pills left. Pharmacy cannot get in. Patient is requesting an alternative to be sent to [facility]. Please Advise.

## 2019-08-22 ENCOUNTER — Ambulatory Visit (INDEPENDENT_AMBULATORY_CARE_PROVIDER_SITE_OTHER): Payer: Medicare Other | Admitting: Podiatry

## 2019-08-22 ENCOUNTER — Encounter: Payer: Self-pay | Admitting: Podiatry

## 2019-08-22 DIAGNOSIS — M79674 Pain in right toe(s): Secondary | ICD-10-CM | POA: Diagnosis not present

## 2019-08-22 DIAGNOSIS — M79675 Pain in left toe(s): Secondary | ICD-10-CM

## 2019-08-22 DIAGNOSIS — B351 Tinea unguium: Secondary | ICD-10-CM | POA: Diagnosis not present

## 2019-08-22 NOTE — Patient Instructions (Signed)
Diabetes Mellitus and Foot Care Foot care is an important part of your health, especially when you have diabetes. Diabetes may cause you to have problems because of poor blood flow (circulation) to your feet and legs, which can cause your skin to:  Become thinner and drier.  Break more easily.  Heal more slowly.  Peel and crack. You may also have nerve damage (neuropathy) in your legs and feet, causing decreased feeling in them. This means that you may not notice minor injuries to your feet that could lead to more serious problems. Noticing and addressing any potential problems early is the best way to prevent future foot problems. How to care for your feet Foot hygiene  Wash your feet daily with warm water and mild soap. Do not use hot water. Then, pat your feet and the areas between your toes until they are completely dry. Do not soak your feet as this can dry your skin.  Trim your toenails straight across. Do not dig under them or around the cuticle. File the edges of your nails with an emery board or nail file.  Apply a moisturizing lotion or petroleum jelly to the skin on your feet and to dry, brittle toenails. Use lotion that does not contain alcohol and is unscented. Do not apply lotion between your toes. Shoes and socks  Wear clean socks or stockings every day. Make sure they are not too tight. Do not wear knee-high stockings since they may decrease blood flow to your legs.  Wear shoes that fit properly and have enough cushioning. Always look in your shoes before you put them on to be sure there are no objects inside.  To break in new shoes, wear them for just a few hours a day. This prevents injuries on your feet. Wounds, scrapes, corns, and calluses  Check your feet daily for blisters, cuts, bruises, sores, and redness. If you cannot see the bottom of your feet, use a mirror or ask someone for help.  Do not cut corns or calluses or try to remove them with medicine.  If you  find a minor scrape, cut, or break in the skin on your feet, keep it and the skin around it clean and dry. You may clean these areas with mild soap and water. Do not clean the area with peroxide, alcohol, or iodine.  If you have a wound, scrape, corn, or callus on your foot, look at it several times a day to make sure it is healing and not infected. Check for: ? Redness, swelling, or pain. ? Fluid or blood. ? Warmth. ? Pus or a bad smell. General instructions  Do not cross your legs. This may decrease blood flow to your feet.  Do not use heating pads or hot water bottles on your feet. They may burn your skin. If you have lost feeling in your feet or legs, you may not know this is happening until it is too late.  Protect your feet from hot and cold by wearing shoes, such as at the beach or on hot pavement.  Schedule a complete foot exam at least once a year (annually) or more often if you have foot problems. If you have foot problems, report any cuts, sores, or bruises to your health care provider immediately. Contact a health care provider if:  You have a medical condition that increases your risk of infection and you have any cuts, sores, or bruises on your feet.  You have an injury that is not   healing.  You have redness on your legs or feet.  You feel burning or tingling in your legs or feet.  You have pain or cramps in your legs and feet.  Your legs or feet are numb.  Your feet always feel cold.  You have pain around a toenail. Get help right away if:  You have a wound, scrape, corn, or callus on your foot and: ? You have pain, swelling, or redness that gets worse. ? You have fluid or blood coming from the wound, scrape, corn, or callus. ? Your wound, scrape, corn, or callus feels warm to the touch. ? You have pus or a bad smell coming from the wound, scrape, corn, or callus. ? You have a fever. ? You have a red line going up your leg. Summary  Check your feet every day  for cuts, sores, red spots, swelling, and blisters.  Moisturize feet and legs daily.  Wear shoes that fit properly and have enough cushioning.  If you have foot problems, report any cuts, sores, or bruises to your health care provider immediately.  Schedule a complete foot exam at least once a year (annually) or more often if you have foot problems. This information is not intended to replace advice given to you by your health care provider. Make sure you discuss any questions you have with your health care provider. Document Released: 09/26/2000 Document Revised: 11/11/2017 Document Reviewed: 10/31/2016 Elsevier Patient Education  2020 [employer] Inc[employer]  

## 2019-08-22 NOTE — Progress Notes (Signed)
Subjective: Adrian Matthews is seen today for follow up painful, elongated, thickened toenails 1-5 b/l feet that he cannot cut. Pain interferes with daily activities. Aggravating factor includes wearing enclosed shoe gear and relieved with periodic debridement.  He voices no new pedal problems on today's visit.  Current Outpatient Medications on File Prior to Visit  Medication Sig  . allopurinol (ZYLOPRIM) 100 MG tablet Take 1 tablet (100 mg total) by mouth daily.  Marland Kitchen amLODipine (NORVASC) 10 MG tablet TAKE 1 TABLET DAILY FOR BLOOD PRESSURE  . aspirin EC 81 MG tablet Take 1 tablet (81 mg total) by mouth daily.  Marland Kitchen atorvastatin (LIPITOR) 40 MG tablet TAKE 1 TABLET DAILY  . chlorthalidone (HYGROTON) 25 MG tablet TAKE 1 TABLET DAILY FOR BLOOD PRESSURE  . Cholecalciferol (VITAMIN D3) 2000 units TABS Take 2,000 Units by mouth daily.   Marland Kitchen FREESTYLE LITE test strip CHECK BLOOD SUGAR THREE TIMES A DAY  . gabapentin (NEURONTIN) 300 MG capsule Take 1 capsule (300 mg total) by mouth 3 (three) times daily.  . insulin lispro (HUMALOG KWIKPEN) 100 UNIT/ML KwikPen Inject 22 units before breakfast, 26 units before lunch & supper-plus sliding scale  . Insulin Pen Needle (SURE COMFORT PEN NEEDLES) 32G X 4 MM MISC Use 4 times daily before meals DX: E11.65  . irbesartan (AVAPRO) 150 MG tablet Take 1 tablet (150 mg total) by mouth daily.  Marland Kitchen LANTUS SOLOSTAR 100 UNIT/ML Solostar Pen INJECT 66 UNITS UNDER THE SKIN DAILY  . Omega-3 Fatty Acids (FISH OIL) 1200 MG CAPS Take 1,200 mg by mouth daily.   . pantoprazole (PROTONIX) 40 MG tablet TAKE 1 TABLET DAILY  . potassium chloride SA (K-DUR) 20 MEQ tablet TAKE 1 TABLET TWICE A DAY   No current facility-administered medications on file prior to visit.      No Known Allergies   Objective:  Vascular Examination: Capillary refill time <3 seconds b/l.  Dorsalis pedis present b/l.  Posterior tibial pulses present b/l.  Digital hair absent b/l.  Skin temperature  gradient WNL b/l.   Dermatological Examination: Skin with normal turgor, texture and tone b/l.  Toenails 1-5 b/l discolored, thick, dystrophic with subungual debris and pain with palpation to nailbeds due to thickness of nails.  Musculoskeletal: Muscle strength 5/5 to all LE muscle groups b/l.  No gross bony deformities b/l.  No pain, crepitus or joint limitation noted with ROM b/l.  Neurological Examination: Protective sensation diminished with 10 gram monofilament bilaterally.  Assessment: Painful onychomycosis toenails 1-5 b/l   Plan: 1. Toenails 1-5 b/l were debrided in length and girth without iatrogenic bleeding. 2. Patient to continue soft, supportive shoe gear daily.  3. Patient to report any pedal injuries to medical professional immediately. 4. Follow up 3 months.  5. Patient/POA to call should there be a concern in the interim.

## 2019-09-12 ENCOUNTER — Other Ambulatory Visit: Payer: Self-pay

## 2019-09-12 ENCOUNTER — Other Ambulatory Visit: Payer: Medicare Other

## 2019-09-12 DIAGNOSIS — E1169 Type 2 diabetes mellitus with other specified complication: Secondary | ICD-10-CM

## 2019-09-12 DIAGNOSIS — E1122 Type 2 diabetes mellitus with diabetic chronic kidney disease: Secondary | ICD-10-CM

## 2019-09-12 DIAGNOSIS — E785 Hyperlipidemia, unspecified: Secondary | ICD-10-CM | POA: Diagnosis not present

## 2019-09-12 DIAGNOSIS — N183 Chronic kidney disease, stage 3 unspecified: Secondary | ICD-10-CM

## 2019-09-12 DIAGNOSIS — Z794 Long term (current) use of insulin: Secondary | ICD-10-CM | POA: Diagnosis not present

## 2019-09-13 LAB — CBC WITH DIFFERENTIAL/PLATELET
Absolute Monocytes: 517 cells/uL (ref 200–950)
Basophils Absolute: 38 cells/uL (ref 0–200)
Basophils Relative: 0.3 %
Eosinophils Absolute: 164 cells/uL (ref 15–500)
Eosinophils Relative: 1.3 %
HCT: 39.2 % (ref 38.5–50.0)
Hemoglobin: 12.4 g/dL — ABNORMAL LOW (ref 13.2–17.1)
Lymphs Abs: 5456 cells/uL — ABNORMAL HIGH (ref 850–3900)
MCH: 25.3 pg — ABNORMAL LOW (ref 27.0–33.0)
MCHC: 31.6 g/dL — ABNORMAL LOW (ref 32.0–36.0)
MCV: 79.8 fL — ABNORMAL LOW (ref 80.0–100.0)
MPV: 10.4 fL (ref 7.5–12.5)
Monocytes Relative: 4.1 %
Neutro Abs: 6426 cells/uL (ref 1500–7800)
Neutrophils Relative %: 51 %
Platelets: 266 10*3/uL (ref 140–400)
RBC: 4.91 10*6/uL (ref 4.20–5.80)
RDW: 17.2 % — ABNORMAL HIGH (ref 11.0–15.0)
Total Lymphocyte: 43.3 %
WBC: 12.6 10*3/uL — ABNORMAL HIGH (ref 3.8–10.8)

## 2019-09-13 LAB — HEMOGLOBIN A1C
Hgb A1c MFr Bld: 7.3 % of total Hgb — ABNORMAL HIGH (ref <5.7)
Mean Plasma Glucose: 163 (calc)
eAG (mmol/L): 9 (calc)

## 2019-09-13 LAB — BASIC METABOLIC PANEL WITH GFR
BUN/Creatinine Ratio: 19 (calc) (ref 6–22)
BUN: 26 mg/dL — ABNORMAL HIGH (ref 7–25)
CO2: 30 mmol/L (ref 20–32)
Calcium: 10.1 mg/dL (ref 8.6–10.3)
Chloride: 98 mmol/L (ref 98–110)
Creat: 1.4 mg/dL — ABNORMAL HIGH (ref 0.70–1.18)
GFR, Est African American: 58 mL/min/{1.73_m2} — ABNORMAL LOW (ref >=60)
GFR, Est Non African American: 50 mL/min/{1.73_m2} — ABNORMAL LOW (ref >=60)
Glucose, Bld: 134 mg/dL — ABNORMAL HIGH (ref 65–99)
Potassium: 3.3 mmol/L — ABNORMAL LOW (ref 3.5–5.3)
Sodium: 138 mmol/L (ref 135–146)

## 2019-09-13 LAB — LIPID PANEL
Cholesterol: 149 mg/dL (ref <200)
HDL: 43 mg/dL (ref >=40)
LDL Cholesterol (Calc): 88 mg/dL (calc)
Non-HDL Cholesterol (Calc): 106 mg/dL (calc) (ref <130)
Total CHOL/HDL Ratio: 3.5 (calc) (ref <5.0)
Triglycerides: 89 mg/dL (ref <150)

## 2019-09-15 ENCOUNTER — Ambulatory Visit (INDEPENDENT_AMBULATORY_CARE_PROVIDER_SITE_OTHER): Payer: Medicare Other | Admitting: Internal Medicine

## 2019-09-15 ENCOUNTER — Other Ambulatory Visit: Payer: Self-pay

## 2019-09-15 ENCOUNTER — Encounter: Payer: Self-pay | Admitting: Internal Medicine

## 2019-09-15 VITALS — BP 162/80 | HR 91 | Temp 98.0°F | Ht 69.0 in | Wt 385.0 lb

## 2019-09-15 DIAGNOSIS — E1169 Type 2 diabetes mellitus with other specified complication: Secondary | ICD-10-CM

## 2019-09-15 DIAGNOSIS — E0822 Diabetes mellitus due to underlying condition with diabetic chronic kidney disease: Secondary | ICD-10-CM

## 2019-09-15 DIAGNOSIS — D352 Benign neoplasm of pituitary gland: Secondary | ICD-10-CM | POA: Diagnosis not present

## 2019-09-15 DIAGNOSIS — E785 Hyperlipidemia, unspecified: Secondary | ICD-10-CM

## 2019-09-15 DIAGNOSIS — D72829 Elevated white blood cell count, unspecified: Secondary | ICD-10-CM | POA: Diagnosis not present

## 2019-09-15 DIAGNOSIS — E1122 Type 2 diabetes mellitus with diabetic chronic kidney disease: Secondary | ICD-10-CM | POA: Diagnosis not present

## 2019-09-15 DIAGNOSIS — E66813 Obesity, class 3: Secondary | ICD-10-CM

## 2019-09-15 DIAGNOSIS — Z1159 Encounter for screening for other viral diseases: Secondary | ICD-10-CM

## 2019-09-15 DIAGNOSIS — E876 Hypokalemia: Secondary | ICD-10-CM | POA: Diagnosis not present

## 2019-09-15 DIAGNOSIS — I10 Essential (primary) hypertension: Secondary | ICD-10-CM | POA: Diagnosis not present

## 2019-09-15 DIAGNOSIS — E229 Hyperfunction of pituitary gland, unspecified: Secondary | ICD-10-CM | POA: Diagnosis not present

## 2019-09-15 DIAGNOSIS — Z794 Long term (current) use of insulin: Secondary | ICD-10-CM

## 2019-09-15 DIAGNOSIS — Z6841 Body Mass Index (BMI) 40.0 and over, adult: Secondary | ICD-10-CM

## 2019-09-15 DIAGNOSIS — N182 Chronic kidney disease, stage 2 (mild): Secondary | ICD-10-CM

## 2019-09-15 MED ORDER — LANTUS SOLOSTAR 100 UNIT/ML ~~LOC~~ SOPN: PEN_INJECTOR | SUBCUTANEOUS | 3 refills | Status: DC

## 2019-09-15 MED ORDER — INSULIN LISPRO (1 UNIT DIAL) 100 UNIT/ML (KWIKPEN): PEN_INJECTOR | SUBCUTANEOUS | 3 refills | Status: DC

## 2019-09-15 NOTE — Patient Instructions (Signed)
Come in 2 weeks for fasting labs to recheck your white blood cells and your potassium, and for the hepatitis C screen.

## 2019-09-15 NOTE — Progress Notes (Signed)
Location:  [facility] clinic Provider:  Ethne Jeon L. Mariea Clonts, D.O., C.M.D.  Goals of Care:  Advanced Directives 10/19/2018  Does Patient Have a Medical Advance Directive? Yes  Type of Paramedic of [place];Living will  Does patient want to make changes to medical advance directive? -  Copy of [place] in Chart? No - copy requested  Would patient like information on creating a medical advance directive? -  Pre-existing out of facility DNR order (yellow form or pink MOST form) -     Chief Complaint  Patient presents with  . Medical Management of Chronic Issues    4 month follow-up and discuss labs(copy printed)   . Quality Metric Gaps    Foot exam due, per patient he see's a foot doctor every 3 months   . Best Practice Recommendations    Discuss need for Hep C screening   . Medication Refill    Avapro makes patient drowsey     HPI: Patient is a 73 y.o. male seen today for medical management of chronic diseases.    Leukocytosis:  He admits he was stressed out after he lost his brother in [place] and they'd buried him on sat and labs were Monday.  He says there was family drama.    He is eating right.  BP is high this am.  He had both meds this morning--maybe not soon enough before.  His weight is up 10 lbs since feb.  He gets out and walks a little bit around noon.  Does walk when has to go shopping any.  He does not want to pay so much to go to the gym and only be able to go for 45 mins.   Past Medical History:  Diagnosis Date  . Asymptomatic varicose veins   . Chronic kidney disease   . Diaphragmatic hernia without mention of obstruction or gangrene   . DM (diabetes mellitus) ([place])   . GERD (gastroesophageal reflux disease)   . Gout, unspecified   . Hypercalcemia   . Hyperlipidemia   . Hyperosmolality and/or hypernatremia   . Hypertension   . Monoclonal paraproteinemia   . New daily persistent headache   . Osteoarthrosis,  unspecified whether generalized or localized, lower leg   . Other malaise and fatigue   . Primary localized osteoarthrosis, unspecified site   . Unspecified disorder of kidney and ureter   . Unspecified hereditary and idiopathic peripheral neuropathy     Past Surgical History:  Procedure Laterality Date  . BIOPSY  10/19/2018   Procedure: BIOPSY;  Surgeon: Ronald Lobo, MD;  Location: [facility] ENDOSCOPY;  Service: Endoscopy;;  . CARPAL TUNNEL RELEASE  2007  . COLONOSCOPY N/A 06/06/2013   Procedure: COLONOSCOPY;  Surgeon: Cleotis Nipper, MD;  Location: [facility] ENDOSCOPY;  Service: Endoscopy;  Laterality: N/A;  . COLONOSCOPY WITH PROPOFOL N/A 10/19/2018   Procedure: COLONOSCOPY WITH PROPOFOL;  Surgeon: Ronald Lobo, MD;  Location: [facility] ENDOSCOPY;  Service: Endoscopy;  Laterality: N/A;  . POLYPECTOMY  10/19/2018   Procedure: POLYPECTOMY;  Surgeon: Ronald Lobo, MD;  Location: [facility] ENDOSCOPY;  Service: Endoscopy;;  . TUMOR REMOVAL  2017   Pituitary adenoma    No Known Allergies  Outpatient Encounter Medications as of 09/15/2019  Medication Sig  . allopurinol (ZYLOPRIM) 100 MG tablet Take 1 tablet (100 mg total) by mouth daily.  Marland Kitchen amLODipine (NORVASC) 10 MG tablet TAKE 1 TABLET DAILY FOR BLOOD PRESSURE  . aspirin EC 81 MG tablet Take 1 tablet (81  mg total) by mouth daily.  Marland Kitchen atorvastatin (LIPITOR) 40 MG tablet TAKE 1 TABLET DAILY  . chlorthalidone (HYGROTON) 25 MG tablet TAKE 1 TABLET DAILY FOR BLOOD PRESSURE  . Cholecalciferol (VITAMIN D3) 2000 units TABS Take 2,000 Units by mouth daily.   Marland Kitchen FREESTYLE LITE test strip CHECK BLOOD SUGAR THREE TIMES A DAY  . gabapentin (NEURONTIN) 300 MG capsule Take 1 capsule (300 mg total) by mouth 3 (three) times daily.  . insulin lispro (HUMALOG KWIKPEN) 100 UNIT/ML KwikPen Inject 22 units before breakfast, 26 units before lunch & supper-plus sliding scale  . Insulin Pen Needle (SURE COMFORT PEN NEEDLES) 32G X 4 MM MISC Use 4 times daily before meals DX: E11.65    . irbesartan (AVAPRO) 150 MG tablet Take 1 tablet (150 mg total) by mouth daily.  Marland Kitchen LANTUS SOLOSTAR 100 UNIT/ML Solostar Pen INJECT 66 UNITS UNDER THE SKIN DAILY  . Omega-3 Fatty Acids (FISH OIL) 1200 MG CAPS Take 1,200 mg by mouth daily.   . pantoprazole (PROTONIX) 40 MG tablet TAKE 1 TABLET DAILY  . potassium chloride SA (K-DUR) 20 MEQ tablet TAKE 1 TABLET TWICE A DAY   No facility-administered encounter medications on file as of 09/15/2019.     Review of Systems:  Review of Systems  Constitutional: Negative for chills, fever and malaise/fatigue.  HENT: Negative for hearing loss.   Eyes: Negative for blurred vision and double vision.  Respiratory: Negative for cough and shortness of breath.   Cardiovascular: Negative for chest pain, palpitations and leg swelling.  Gastrointestinal: Negative for abdominal pain, blood in stool, constipation, diarrhea, melena, nausea and vomiting.  Genitourinary: Negative for dysuria.  Musculoskeletal: Positive for back pain and joint pain. Negative for falls.  Skin: Negative for itching and rash.  Neurological: Positive for tingling and sensory change. Negative for dizziness and loss of consciousness.  Endo/Heme/Allergies: Does not bruise/bleed easily.  Psychiatric/Behavioral: Negative for depression and memory loss. The patient is not nervous/anxious and does not have insomnia.     Health Maintenance  Topic Date Due  . Hepatitis C Screening  [birth year]  . FOOT EXAM  08/31/2019  . HEMOGLOBIN A1C  03/11/2020  . OPHTHALMOLOGY EXAM  07/17/2020  . TETANUS/TDAP  04/13/2023  . COLONOSCOPY  10/19/2028  . INFLUENZA VACCINE  Completed  . PNA vac Low Risk Adult  Completed    Physical Exam: Vitals:   09/15/19 0825  BP: (!) 158/72  Pulse: 91  Temp: 98 F (36.7 C)  TempSrc: Temporal  SpO2: 98%  Weight: (!) 385 lb (174.6 kg)  Height: 5\' 9"  (1.753 m)   Body mass index is 56.85 kg/m. Physical Exam Vitals signs reviewed.  Constitutional:       General: He is not in acute distress.    Appearance: Normal appearance. He is obese. He is not toxic-appearing.  HENT:     Head: Normocephalic and atraumatic.  Cardiovascular:     Rate and Rhythm: Normal rate and regular rhythm.     Heart sounds: No murmur.  Pulmonary:     Effort: Pulmonary effort is normal.     Breath sounds: Normal breath sounds.  Musculoskeletal: Normal range of motion.     Right lower leg: Edema present.     Left lower leg: Edema present.  Skin:    General: Skin is warm and dry.  Neurological:     General: No focal deficit present.     Mental Status: He is alert and oriented to person, place, and time.  Psychiatric:  Mood and Affect: Mood normal.        Behavior: Behavior normal.     Labs reviewed: Basic Metabolic Panel: Recent Labs    05/05/19 0819 09/12/19 0828  NA 137 138  K 3.6 3.3*  CL 99 98  CO2 28 30  GLUCOSE 143* 134*  BUN 23 26*  CREATININE 1.35* 1.40*  CALCIUM 9.8 10.1   Liver Function Tests: No results for input(s): AST, ALT, ALKPHOS, BILITOT, PROT, ALBUMIN in the last 8760 hours. No results for input(s): LIPASE, AMYLASE in the last 8760 hours. No results for input(s): AMMONIA in the last 8760 hours. CBC: Recent Labs    09/12/19 0828  WBC 12.6*  NEUTROABS 6,426  HGB 12.4*  HCT 39.2  MCV 79.8*  PLT 266   Lipid Panel: Recent Labs    09/12/19 0828  CHOL 149  HDL 43  LDLCALC 88  TRIG 89  CHOLHDL 3.5   Lab Results  Component Value Date   HGBA1C 7.3 (H) 09/12/2019    Procedures since last visit: No results found.  Assessment/Plan 1. Leukocytosis, unspecified type - new issue and no s/s of any infection whatsoever -f/u lab in 2 wks--may have been stress-related - CBC with Differential/Platelet; Future - CBC with Differential/Platelet; Future  2. Hypokalemia -has missed his night kcl a few times and he thinks that's why it's low, will recheck in 2 wks after regular use - Basic metabolic panel; Future -  Basic metabolic panel; Future  3. Pituitary microadenoma with hyperprolactinemia ([place]) -followed by Dr. Celesta Gentile opted not to pursue further tx at [facility] where he had his pituitary surgery -continues to gain weight when exercise not as aggressive even with better eating habits  4. Essential hypertension -bp high today -had adjusted meds due to availability of ARB so now bps high again plus less active amid covid with gyms closed or restricted -he will check his bp a few times a week for the next two weeks and bring results with him to the next visit so I can adjust his medications based on home readings  5. Hyperlipidemia associated with type 2 diabetes mellitus ([place]) -cont lipitor therapy and trying to eat low fat meats and more veggies  6. Class 3 severe obesity with serious comorbidity and body mass index (BMI) of 50.0 to 59.9 in adult, unspecified obesity type ([place]) -encouraged his walking even when cold  7. Encounter for hepatitis C screening test for low risk patient - Hep C Antibody; Future  8. Obesity, morbid (more than 100 lbs over ideal weight or BMI > 40) ([facility]) -again, encouraged walking, healthy diet, has endocrine f/u upcoming  9. Type 2 diabetes mellitus with stage 2 chronic kidney disease, with long-term current use of insulin ([facility]) - needed refills of insulin for [facility] - insulin lispro (HUMALOG KWIKPEN) 100 UNIT/ML KwikPen; Inject 22 units before breakfast, 26 units before lunch & supper-plus sliding scale  Dispense: 25 pen; Refill: 3 - Hemoglobin A1c; Future  10. Diabetes mellitus due to underlying condition, controlled, with stage 2 chronic kidney disease, with long-term current use of insulin ([facility]) -cont same insulin regimen - hba1c stable, renal function stable  - Insulin Glargine (LANTUS SOLOSTAR) 100 UNIT/ML Solostar Pen; INJECT 66 UNITS UNDER THE SKIN DAILY  Dispense: 60 mL; Refill: 3 - Hemoglobin A1c; Future   Labs/tests ordered:  Lab Orders     Basic  metabolic panel     CBC with Differential/Platelet     Hep C Antibody  Hemoglobin A1c     CBC with Differential/Platelet     Basic metabolic panel  Next appt:  01/20/2020 but labs in 2 wks and again before appt in april  Cooper Moroney L. Erroll Wilbourne, D.O. [facility] [address], [place] [address] Cell Phone (Mon-Fri 8am-5pm):  [phone] On Call:  [phone] & follow prompts after 5pm & weekends Office Phone:  [phone] Office Fax:  [phone]

## 2019-09-29 ENCOUNTER — Other Ambulatory Visit: Payer: Self-pay

## 2019-09-29 ENCOUNTER — Other Ambulatory Visit: Payer: Medicare Other

## 2019-09-29 DIAGNOSIS — E1122 Type 2 diabetes mellitus with diabetic chronic kidney disease: Secondary | ICD-10-CM

## 2019-09-29 DIAGNOSIS — E0822 Diabetes mellitus due to underlying condition with diabetic chronic kidney disease: Secondary | ICD-10-CM

## 2019-09-29 DIAGNOSIS — Z1159 Encounter for screening for other viral diseases: Secondary | ICD-10-CM

## 2019-09-29 DIAGNOSIS — Z794 Long term (current) use of insulin: Secondary | ICD-10-CM

## 2019-09-29 DIAGNOSIS — E876 Hypokalemia: Secondary | ICD-10-CM

## 2019-09-29 DIAGNOSIS — D72829 Elevated white blood cell count, unspecified: Secondary | ICD-10-CM

## 2019-09-29 LAB — CBC WITH DIFFERENTIAL/PLATELET
Absolute Monocytes: 546 cells/uL (ref 200–950)
Basophils Absolute: 58 cells/uL (ref 0–200)
Basophils Relative: 1.1 %
Eosinophils Absolute: 127 cells/uL (ref 15–500)
Eosinophils Relative: 2.4 %
HCT: 37.1 % — ABNORMAL LOW (ref 38.5–50.0)
Hemoglobin: 12 g/dL — ABNORMAL LOW (ref 13.2–17.1)
Lymphs Abs: 2894 cells/uL (ref 850–3900)
MCH: 26.8 pg — ABNORMAL LOW (ref 27.0–33.0)
MCHC: 32.3 g/dL (ref 32.0–36.0)
MCV: 83 fL (ref 80.0–100.0)
MPV: 9.9 fL (ref 7.5–12.5)
Monocytes Relative: 10.3 %
Neutro Abs: 1675 cells/uL (ref 1500–7800)
Neutrophils Relative %: 31.6 %
Platelets: 223 10*3/uL (ref 140–400)
RBC: 4.47 10*6/uL (ref 4.20–5.80)
RDW: 12.4 % (ref 11.0–15.0)
Total Lymphocyte: 54.6 %
WBC: 5.3 10*3/uL (ref 3.8–10.8)

## 2019-09-30 ENCOUNTER — Other Ambulatory Visit: Payer: Self-pay | Admitting: Internal Medicine

## 2019-09-30 DIAGNOSIS — I10 Essential (primary) hypertension: Secondary | ICD-10-CM

## 2019-09-30 LAB — BASIC METABOLIC PANEL
BUN/Creatinine Ratio: 14 (calc) (ref 6–22)
BUN: 18 mg/dL (ref 7–25)
CO2: 28 mmol/L (ref 20–32)
Calcium: 9.7 mg/dL (ref 8.6–10.3)
Chloride: 102 mmol/L (ref 98–110)
Creat: 1.33 mg/dL — ABNORMAL HIGH (ref 0.70–1.18)
Glucose, Bld: 158 mg/dL — ABNORMAL HIGH (ref 65–99)
Potassium: 3.8 mmol/L (ref 3.5–5.3)
Sodium: 141 mmol/L (ref 135–146)

## 2019-09-30 MED ORDER — IRBESARTAN 300 MG PO TABS: 300.0000 mg | ORAL_TABLET | Freq: Every day | ORAL | 3 refills | Status: DC

## 2019-09-30 NOTE — Progress Notes (Signed)
Blood counts stable.  Kidneys stable.  No changes needed.

## 2019-10-03 NOTE — Addendum Note (Signed)
Addended by: [name] on: 10/03/2019 05:24 AM   Modules accepted: Orders

## 2019-10-18 ENCOUNTER — Telehealth: Payer: Self-pay | Admitting: *Deleted

## 2019-10-18 NOTE — Telephone Encounter (Signed)
Pt called back and stated that his blood pressure is doing better, the top number running 148, 155, 158 sometimes. He states he doesn't feel bad, not dizzy but feels like going out.

## 2019-10-18 NOTE — Telephone Encounter (Signed)
Spoke with patient and advised results   

## 2019-10-18 NOTE — Telephone Encounter (Signed)
What does he feels like going out mean if he's not dizzy?  Those blood pressures are only a smidge better than before.

## 2019-10-18 NOTE — Telephone Encounter (Signed)
Meaning that he's going outside

## 2019-10-18 NOTE — Telephone Encounter (Signed)
[name], CMA  09/30/2019 4:18 PM EST    Spoke with patient and advised results, he will call with results.   Reed, Tiffany L, DO  09/30/2019 4:02 PM EST    Increase irbesartan to 300mg  daily. Continue to monitor bp and call back if still getting high readings. He may take two of his current tablets. [facility]

## 2019-10-18 NOTE — Telephone Encounter (Signed)
Oh ok.  Noted.

## 2019-10-25 DIAGNOSIS — N183 Chronic kidney disease, stage 3 unspecified: Secondary | ICD-10-CM | POA: Diagnosis not present

## 2019-11-01 DIAGNOSIS — E893 Postprocedural hypopituitarism: Secondary | ICD-10-CM | POA: Diagnosis not present

## 2019-11-01 DIAGNOSIS — Z8639 Personal history of other endocrine, nutritional and metabolic disease: Secondary | ICD-10-CM | POA: Diagnosis not present

## 2019-11-01 DIAGNOSIS — N183 Chronic kidney disease, stage 3 unspecified: Secondary | ICD-10-CM | POA: Diagnosis not present

## 2019-11-10 ENCOUNTER — Telehealth: Payer: Self-pay | Admitting: *Deleted

## 2019-11-10 NOTE — Telephone Encounter (Signed)
Taking the blood pressure readings before the medication is not useful.  It should be checked an hour afterward.  He can try splitting up his blood pressure medications.  Take the norvasc and chlorthalidone in the morning and the irbesartan in the evening.  He has not been reporting any low blood pressures.  150s is still on the high side

## 2019-11-10 NOTE — Telephone Encounter (Signed)
Patient called and stated that he has been taking the Irbesartan 300mg  after breakfast and about 2 hours after taking he is drowsy and sleepy. (taking amlodipine 10mg  and Chlorthalidone 25 also taking in the mornings) Kidney dr. Rockey Situ him that the Irbesartan would give him diarrhea and if it did to stop it, hasn't caused it yet. Stated that he has not taken the Irbesartan this morning but will later on this morning with a snack to see if that helps with the symptoms.  Stated he took his blood pressure this morning and it was 163/83 (before taking any medication or eating).  Stated that he is so tired during the day that when it starts getting dark he is ready to go to bed and sleeps all night.   Please Advise.   (patient did not have any extra blood pressure reading.)

## 2019-11-10 NOTE — Telephone Encounter (Signed)
Patient notified and agreed.  

## 2019-11-21 ENCOUNTER — Encounter: Payer: Self-pay | Admitting: Internal Medicine

## 2019-11-21 ENCOUNTER — Ambulatory Visit (INDEPENDENT_AMBULATORY_CARE_PROVIDER_SITE_OTHER): Payer: Medicare Other | Admitting: Podiatry

## 2019-11-21 ENCOUNTER — Encounter: Payer: Self-pay | Admitting: Podiatry

## 2019-11-21 ENCOUNTER — Other Ambulatory Visit: Payer: Self-pay

## 2019-11-21 DIAGNOSIS — E1142 Type 2 diabetes mellitus with diabetic polyneuropathy: Secondary | ICD-10-CM

## 2019-11-21 DIAGNOSIS — M79674 Pain in right toe(s): Secondary | ICD-10-CM

## 2019-11-21 DIAGNOSIS — M79675 Pain in left toe(s): Secondary | ICD-10-CM

## 2019-11-21 DIAGNOSIS — B351 Tinea unguium: Secondary | ICD-10-CM

## 2019-11-21 NOTE — Patient Instructions (Signed)
Diabetes Mellitus and Foot Care Foot care is an important part of your health, especially when you have diabetes. Diabetes may cause you to have problems because of poor blood flow (circulation) to your feet and legs, which can cause your skin to:  Become thinner and drier.  Break more easily.  Heal more slowly.  Peel and crack. You may also have nerve damage (neuropathy) in your legs and feet, causing decreased feeling in them. This means that you may not notice minor injuries to your feet that could lead to more serious problems. Noticing and addressing any potential problems early is the best way to prevent future foot problems. How to care for your feet Foot hygiene  Wash your feet daily with warm water and mild soap. Do not use hot water. Then, pat your feet and the areas between your toes until they are completely dry. Do not soak your feet as this can dry your skin.  Trim your toenails straight across. Do not dig under them or around the cuticle. File the edges of your nails with an emery board or nail file.  Apply a moisturizing lotion or petroleum jelly to the skin on your feet and to dry, brittle toenails. Use lotion that does not contain alcohol and is unscented. Do not apply lotion between your toes. Shoes and socks  Wear clean socks or stockings every day. Make sure they are not too tight. Do not wear knee-high stockings since they may decrease blood flow to your legs.  Wear shoes that fit properly and have enough cushioning. Always look in your shoes before you put them on to be sure there are no objects inside.  To break in new shoes, wear them for just a few hours a day. This prevents injuries on your feet. Wounds, scrapes, corns, and calluses  Check your feet daily for blisters, cuts, bruises, sores, and redness. If you cannot see the bottom of your feet, use a mirror or ask someone for help.  Do not cut corns or calluses or try to remove them with medicine.  If you  find a minor scrape, cut, or break in the skin on your feet, keep it and the skin around it clean and dry. You may clean these areas with mild soap and water. Do not clean the area with peroxide, alcohol, or iodine.  If you have a wound, scrape, corn, or callus on your foot, look at it several times a day to make sure it is healing and not infected. Check for: ? Redness, swelling, or pain. ? Fluid or blood. ? Warmth. ? Pus or a bad smell. General instructions  Do not cross your legs. This may decrease blood flow to your feet.  Do not use heating pads or hot water bottles on your feet. They may burn your skin. If you have lost feeling in your feet or legs, you may not know this is happening until it is too late.  Protect your feet from hot and cold by wearing shoes, such as at the beach or on hot pavement.  Schedule a complete foot exam at least once a year (annually) or more often if you have foot problems. If you have foot problems, report any cuts, sores, or bruises to your health care provider immediately. Contact a health care provider if:  You have a medical condition that increases your risk of infection and you have any cuts, sores, or bruises on your feet.  You have an injury that is not   healing.  You have redness on your legs or feet.  You feel burning or tingling in your legs or feet.  You have pain or cramps in your legs and feet.  Your legs or feet are numb.  Your feet always feel cold.  You have pain around a toenail. Get help right away if:  You have a wound, scrape, corn, or callus on your foot and: ? You have pain, swelling, or redness that gets worse. ? You have fluid or blood coming from the wound, scrape, corn, or callus. ? Your wound, scrape, corn, or callus feels warm to the touch. ? You have pus or a bad smell coming from the wound, scrape, corn, or callus. ? You have a fever. ? You have a red line going up your leg. Summary  Check your feet every day  for cuts, sores, red spots, swelling, and blisters.  Moisturize feet and legs daily.  Wear shoes that fit properly and have enough cushioning.  If you have foot problems, report any cuts, sores, or bruises to your health care provider immediately.  Schedule a complete foot exam at least once a year (annually) or more often if you have foot problems. This information is not intended to replace advice given to you by your health care provider. Make sure you discuss any questions you have with your health care provider. Document Revised: 06/22/2019 Document Reviewed: 10/31/2016 Elsevier Patient Education  2020 [employer]  

## 2019-11-21 NOTE — Progress Notes (Signed)
Subjective: Adrian Matthews presents today for follow up of preventative diabetic foot care and painful mycotic nails b/l that are difficult to trim. Pain interferes with ambulation. Aggravating factors include wearing enclosed shoe gear. Pain is relieved with periodic professional debridement.   Adrian Matthews voices no new pedal problems on today's visit.  His PCP is Dr. Hollace Kinnier and last visit was 09/30/2019.  No Known Allergies   Objective: There were no vitals filed for this visit.  Vascular Examination:  Capillary refill time to digits immediate b/l, palpable DP pulses b/l, palpable PT pulses b/l, pedal hair absent b/l and skin temperature gradient within normal limits b/l  Dermatological Examination: Pedal skin with normal turgor, texture and tone bilaterally, no open wounds bilaterally, no interdigital macerations bilaterally and toenails 1-5 b/l elongated, dystrophic, thickened, crumbly with subungual debris  Musculoskeletal: Normal muscle strength 5/5 to all lower extremity muscle groups bilaterally, no gross bony deformities bilaterally, no pain crepitus or joint limitation noted with ROM b/l and patient ambulates independent of any assistive aids  Neurological: Protective sensation absent with 10g monofilament b/l  Assessment: Pain due to onychomycosis of toenails of both feet  Diabetic peripheral neuropathy associated with type 2 diabetes mellitus ([place])  Plan: -Continue diabetic foot care principles. Literature dispensed on today.  -Toenails 1-5 b/l were debrided in length and girth without iatrogenic bleeding. -Patient to continue soft, supportive shoe gear daily. -Patient to report any pedal injuries to medical professional immediately. -Patient/POA to call should there be question/concern in the interim.  Return in about 3 months (around 02/18/2020) for diabetic nail trim.

## 2019-11-29 ENCOUNTER — Encounter: Payer: Self-pay | Admitting: Family

## 2019-11-29 ENCOUNTER — Other Ambulatory Visit: Payer: Self-pay

## 2019-11-29 ENCOUNTER — Ambulatory Visit (INDEPENDENT_AMBULATORY_CARE_PROVIDER_SITE_OTHER): Payer: Medicare Other | Admitting: Family

## 2019-11-29 DIAGNOSIS — Z0000 Encounter for general adult medical examination without abnormal findings: Secondary | ICD-10-CM

## 2019-11-29 NOTE — Patient Instructions (Signed)
[name] , Thank you for taking time to come for your Medicare Wellness Visit. I appreciate your ongoing commitment to your health goals. Please review the following plan we discussed and let me know if I can assist you in the future.   Screening recommendations/referrals: Colonoscopy: Up to date  Recommended yearly ophthalmology/optometry visit for glaucoma screening and checkup Recommended yearly dental visit for hygiene and checkup  Vaccinations: Influenza vaccine: Up to date  Pneumococcal vaccine: Up to date  Tdap vaccine: Up to date due 04/13/2023  Shingles vaccine: Declined     Advanced directives: No   Conditions/risks identified: Advance age male > 4 yrs,male Gender,Type 2 DM,Hypertension,Dyslipidemia,Obesity BMI > 30   Next appointment: 1 year   Preventive Care 39 Years and Older, Male Preventive care refers to lifestyle choices and visits with your health care provider that can promote health and wellness. What does preventive care include?  A yearly physical exam. This is also called an annual well check.  Dental exams once or twice a year.  Routine eye exams. Ask your health care provider how often you should have your eyes checked.  Personal lifestyle choices, including:  Daily care of your teeth and gums.  Regular physical activity.  Eating a healthy diet.  Avoiding tobacco and drug use.  Limiting alcohol use.  Practicing safe sex.  Taking low doses of aspirin every day.  Taking vitamin and mineral supplements as recommended by your health care provider. What happens during an annual well check? The services and screenings done by your health care provider during your annual well check will depend on your age, overall health, lifestyle risk factors, and family history of disease. Counseling  Your health care provider may ask you questions about your:  Alcohol use.  Tobacco use.  Drug use.  Emotional well-being.  Home and relationship  well-being.  Sexual activity.  Eating habits.  History of falls.  Memory and ability to understand (cognition).  Work and work Statistician. Screening  You may have the following tests or measurements:  Height, weight, and BMI.  Blood pressure.  Lipid and cholesterol levels. These may be checked every 5 years, or more frequently if you are over 84 years old.  Skin check.  Lung cancer screening. You may have this screening every year starting at age 36 if you have a 30-pack-year history of smoking and currently smoke or have quit within the past 15 years.  Fecal occult blood test (FOBT) of the stool. You may have this test every year starting at age 81.  Flexible sigmoidoscopy or colonoscopy. You may have a sigmoidoscopy every 5 years or a colonoscopy every 10 years starting at age 90.  Prostate cancer screening. Recommendations will vary depending on your family history and other risks.  Hepatitis C blood test.  Hepatitis B blood test.  Sexually transmitted disease (STD) testing.  Diabetes screening. This is done by checking your blood sugar (glucose) after you have not eaten for a while (fasting). You may have this done every 1-3 years.  Abdominal aortic aneurysm (AAA) screening. You may need this if you are a current or former smoker.  Osteoporosis. You may be screened starting at age 13 if you are at high risk. Talk with your health care provider about your test results, treatment options, and if necessary, the need for more tests. Vaccines  Your health care provider may recommend certain vaccines, such as:  Influenza vaccine. This is recommended every year.  Tetanus, diphtheria, and acellular pertussis (  Tdap, Td) vaccine. You may need a Td booster every 10 years.  Zoster vaccine. You may need this after age 33.  Pneumococcal 13-valent conjugate (PCV13) vaccine. One dose is recommended after age 26.  Pneumococcal polysaccharide (PPSV23) vaccine. One dose is  recommended after age 47. Talk to your health care provider about which screenings and vaccines you need and how often you need them. This information is not intended to replace advice given to you by your health care provider. Make sure you discuss any questions you have with your health care provider. Document Released: 10/26/2015 Document Revised: 06/18/2016 Document Reviewed: 07/31/2015 Elsevier Interactive Patient Education  2017 [employer] Prevention in the Home Falls can cause injuries. They can happen to people of all ages. There are many things you can do to make your home safe and to help prevent falls. What can I do on the outside of my home?  Regularly fix the edges of walkways and driveways and fix any cracks.  Remove anything that might make you trip as you walk through a door, such as a raised step or threshold.  Trim any bushes or trees on the path to your home.  Use bright outdoor lighting.  Clear any walking paths of anything that might make someone trip, such as rocks or tools.  Regularly check to see if handrails are loose or broken. Make sure that both sides of any steps have handrails.  Any raised decks and porches should have guardrails on the edges.  Have any leaves, snow, or ice cleared regularly.  Use sand or salt on walking paths during winter.  Clean up any spills in your garage right away. This includes oil or grease spills. What can I do in the bathroom?  Use night lights.  Install grab bars by the toilet and in the tub and shower. Do not use towel bars as grab bars.  Use non-skid mats or decals in the tub or shower.  If you need to sit down in the shower, use a plastic, non-slip stool.  Keep the floor dry. Clean up any water that spills on the floor as soon as it happens.  Remove soap buildup in the tub or shower regularly.  Attach bath mats securely with double-sided non-slip rug tape.  Do not have throw rugs and other things on  the floor that can make you trip. What can I do in the bedroom?  Use night lights.  Make sure that you have a light by your bed that is easy to reach.  Do not use any sheets or blankets that are too big for your bed. They should not hang down onto the floor.  Have a firm chair that has side arms. You can use this for support while you get dressed.  Do not have throw rugs and other things on the floor that can make you trip. What can I do in the kitchen?  Clean up any spills right away.  Avoid walking on wet floors.  Keep items that you use a lot in easy-to-reach places.  If you need to reach something above you, use a strong step stool that has a grab bar.  Keep electrical cords out of the way.  Do not use floor polish or wax that makes floors slippery. If you must use wax, use non-skid floor wax.  Do not have throw rugs and other things on the floor that can make you trip. What can I do with my stairs?  Do  not leave any items on the stairs.  Make sure that there are handrails on both sides of the stairs and use them. Fix handrails that are broken or loose. Make sure that handrails are as long as the stairways.  Check any carpeting to make sure that it is firmly attached to the stairs. Fix any carpet that is loose or worn.  Avoid having throw rugs at the top or bottom of the stairs. If you do have throw rugs, attach them to the floor with carpet tape.  Make sure that you have a light switch at the top of the stairs and the bottom of the stairs. If you do not have them, ask someone to add them for you. What else can I do to help prevent falls?  Wear shoes that:  Do not have high heels.  Have rubber bottoms.  Are comfortable and fit you well.  Are closed at the toe. Do not wear sandals.  If you use a stepladder:  Make sure that it is fully opened. Do not climb a closed stepladder.  Make sure that both sides of the stepladder are locked into place.  Ask someone to  hold it for you, if possible.  Clearly mark and make sure that you can see:  Any grab bars or handrails.  First and last steps.  Where the edge of each step is.  Use tools that help you move around (mobility aids) if they are needed. These include:  Canes.  Walkers.  Scooters.  Crutches.  Turn on the lights when you go into a dark area. Replace any light bulbs as soon as they burn out.  Set up your furniture so you have a clear path. Avoid moving your furniture around.  If any of your floors are uneven, fix them.  If there are any pets around you, be aware of where they are.  Review your medicines with your doctor. Some medicines can make you feel dizzy. This can increase your chance of falling. Ask your doctor what other things that you can do to help prevent falls. This information is not intended to replace advice given to you by your health care provider. Make sure you discuss any questions you have with your health care provider. Document Released: 07/26/2009 Document Revised: 03/06/2016 Document Reviewed: 11/03/2014 Elsevier Interactive Patient Education  2017 [employer].

## 2019-11-29 NOTE — Progress Notes (Signed)
Patient ID: Adrian Matthews, male   DOB: Oct 30, 1945, 74 y.o.   MRN: [id number] This service is provided via telemedicine  No vital signs collected/recorded due to the encounter was a telemedicine visit.   Location of patient (ex: home, work):  HOME  Patient consents to a telephone visit:  YES  Location of the provider (ex: office, home):  OFFICE  Name of any referring provider:  TIFFANY REED, DO  Names of all persons participating in the telemedicine service and their role in the encounter:  PATIENT, Adrian Matthews, Adrian Matthews, [place], NP  Time spent on call:  6:20   Subjective:   Adrian Matthews is a 74 y.o. male who presents for Medicare Annual/Subsequent preventive examination.  Review of Systems:  Cardiac Risk Factors include: advanced age (>70men, >54 women);male gender;dyslipidemia;diabetes mellitus;hypertension;obesity (BMI >30kg/m2)     Objective:    Vitals: There were no vitals taken for this visit.  There is no height or weight on file to calculate BMI.  Advanced Directives 11/29/2019 10/19/2018 08/30/2018 04/29/2018 12/17/2017 07/09/2017 01/02/2017  Does Patient Have a Medical Advance Directive? No Yes No No No No Yes  Type of Advance Directive - [place];Living will - - - - Press photographer  Does patient want to make changes to medical advance directive? - - - - - - No - Patient declined  Copy of [place] in Chart? - No - copy requested - - - - No - copy requested  Would patient like information on creating a medical advance directive? No - Patient declined - No - Patient declined No - Patient declined No - Patient declined - -  Pre-existing out of facility DNR order (yellow form or pink MOST form) - - - - - - -    Tobacco Social History   Tobacco Use  Smoking Status Never Smoker  Smokeless Tobacco Never Used     Counseling given: Not Answered   Clinical Intake:  Pre-visit preparation completed: No  Pain :  0-10 Pain Score: 3  Pain Type: Chronic pain Pain Location: Knee(Neurpathy pain on legs) Pain Orientation: Left Pain Radiating Towards: No Pain Descriptors / Indicators: Aching Pain Onset: (several years) Pain Frequency: Intermittent Pain Relieving Factors: None Effect of Pain on Daily Activities: No  Pain Relieving Factors: None  BMI - recorded: 55.94 Nutritional Status: BMI > 30  Obese Nutritional Risks: None Diabetes: Yes CBG done?: No Did pt. bring in CBG monitor from home?: No  How often do you need to have someone help you when you read instructions, pamphlets, or other written materials from your doctor or pharmacy?: 1 - Never What is the last grade level you completed in school?: Master's Degree  Interpreter Needed?: No  Information entered by :: Deejay Koppelman FNP-C  Past Medical History:  Diagnosis Date  . Asymptomatic varicose veins   . Chronic kidney disease   . Diaphragmatic hernia without mention of obstruction or gangrene   . DM (diabetes mellitus) ([place])   . GERD (gastroesophageal reflux disease)   . Gout, unspecified   . Hypercalcemia   . Hyperlipidemia   . Hyperosmolality and/or hypernatremia   . Hypertension   . Monoclonal paraproteinemia   . New daily persistent headache   . Osteoarthrosis, unspecified whether generalized or localized, lower leg   . Other malaise and fatigue   . Primary localized osteoarthrosis, unspecified site   . Unspecified disorder of kidney and ureter   . Unspecified hereditary and idiopathic peripheral  neuropathy    Past Surgical History:  Procedure Laterality Date  . BIOPSY  10/19/2018   Procedure: BIOPSY;  Surgeon: Ronald Lobo, MD;  Location: [facility];  Service: Endoscopy;;  . CARPAL TUNNEL RELEASE  2007  . COLONOSCOPY N/A 06/06/2013   Procedure: COLONOSCOPY;  Surgeon: Cleotis Nipper, MD;  Location: [facility] ENDOSCOPY;  Service: Endoscopy;  Laterality: N/A;  . COLONOSCOPY WITH PROPOFOL N/A 10/19/2018   Procedure:  COLONOSCOPY WITH PROPOFOL;  Surgeon: Ronald Lobo, MD;  Location: [facility] ENDOSCOPY;  Service: Endoscopy;  Laterality: N/A;  . POLYPECTOMY  10/19/2018   Procedure: POLYPECTOMY;  Surgeon: Ronald Lobo, MD;  Location: [facility];  Service: Endoscopy;;  . 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 Hypertension Sister   . Hypertension Brother   . Diabetes Brother   . Hypertension Brother   . Hypertension Sister   . Migraines Neg Hx    Social History   Socioeconomic History  . Marital status: Single    Spouse name: Not on file  . Number of children: 0  . Years of education: 69  . Highest education level: Not on file  Occupational History  . Occupation: Retired   Tobacco Use  . Smoking status: Never Smoker  . Smokeless tobacco: Never Used  Substance and Sexual Activity  . Alcohol use: No    Alcohol/week: 0.0 standard drinks  . Drug use: No  . Sexual activity: Not Currently  Other Topics Concern  . Not on file  Social History Narrative   Lives alone.   Caffeine use: Coffee (1 cup/morning)   Soda (rare)   Social Determinants of Health   Financial Resource Strain:   . Difficulty of Paying Living Expenses: Not on file  Food Insecurity:   . Worried About Charity fundraiser in the Last Year: Not on file  . Ran Out of Food in the Last Year: Not on file  Transportation Needs:   . Lack of Transportation (Medical): Not on file  . Lack of Transportation (Non-Medical): Not on file  Physical Activity:   . Days of Exercise per Week: Not on file  . Minutes of Exercise per Session: Not on file  Stress:   . Feeling of Stress : Not on file  Social Connections:   . Frequency of Communication with Friends and Family: Not on file  . Frequency of Social Gatherings with Friends and Family: Not on file  . Attends Religious Services: Not on file  . Active Member of Clubs or Organizations: Not on file  . Attends Archivist  Meetings: Not on file  . Marital Status: Not on file    Outpatient Encounter Medications as of 11/29/2019  Medication Sig  . allopurinol (ZYLOPRIM) 100 MG tablet Take 1 tablet (100 mg total) by mouth daily.  Marland Kitchen amLODipine (NORVASC) 10 MG tablet TAKE 1 TABLET DAILY FOR BLOOD PRESSURE  . aspirin EC 81 MG tablet Take 1 tablet (81 mg total) by mouth daily.  Marland Kitchen atorvastatin (LIPITOR) 40 MG tablet TAKE 1 TABLET DAILY  . chlorthalidone (HYGROTON) 25 MG tablet TAKE 1 TABLET DAILY FOR BLOOD PRESSURE  . Cholecalciferol (VITAMIN D3) 2000 units TABS Take 2,000 Units by mouth daily.   Marland Kitchen FREESTYLE LITE test strip CHECK BLOOD SUGAR THREE TIMES A DAY  . gabapentin (NEURONTIN) 300 MG capsule Take 1 capsule (300 mg total) by mouth 3 (three) times daily.  . Insulin Glargine (LANTUS SOLOSTAR) 100  UNIT/ML Solostar Pen INJECT 66 UNITS UNDER THE SKIN DAILY  . insulin lispro (HUMALOG KWIKPEN) 100 UNIT/ML KwikPen Inject 22 units before breakfast, 26 units before lunch & supper-plus sliding scale  . Insulin Pen Needle (SURE COMFORT PEN NEEDLES) 32G X 4 MM MISC Use 4 times daily before meals DX: E11.65  . irbesartan (AVAPRO) 300 MG tablet Take 1 tablet (300 mg total) by mouth daily.  . Omega-3 Fatty Acids (FISH OIL) 1200 MG CAPS Take 1,200 mg by mouth daily.   . pantoprazole (PROTONIX) 40 MG tablet TAKE 1 TABLET DAILY  . potassium chloride SA (K-DUR) 20 MEQ tablet TAKE 1 TABLET TWICE A DAY   No facility-administered encounter medications on file as of 11/29/2019.    Activities of Daily Living In your present state of health, do you have any difficulty performing the following activities: 11/29/2019  Hearing? N  Vision? N  Difficulty concentrating or making decisions? N  Walking or climbing stairs? N  Dressing or bathing? N  Doing errands, shopping? N  Preparing Food and eating ? N  Using the Toilet? N  In the past six months, have you accidently leaked urine? N  Do you have problems with loss of bowel control? N   Managing your Medications? N  Managing your Finances? N  Housekeeping or managing your Housekeeping? N  Some recent data might be hidden    Patient Care Team: [name], DO as PCP - General (Geriatric Medicine) Elmarie Shiley, MD as Consulting Physician (Nephrology) Sharyne Peach, MD as Consulting Physician (Ophthalmology) Ronald Lobo, MD as Consulting Physician (Gastroenterology)   Assessment:   This is a routine wellness examination for Adrian Matthews.  Exercise Activities and Dietary recommendations Current Exercise Habits: Home exercise routine, Type of exercise: stretching;walking, Time (Minutes): 60, Frequency (Times/Week): 6, Weekly Exercise (Minutes/Week): 360, Intensity: Moderate, Exercise limited by: None identified  Goals    . DIET - EAT MORE FRUITS AND VEGETABLES     Increase fruits,veggies,low carbohydrates in my diet        Fall Risk Fall Risk  11/29/2019 09/15/2019 05/12/2019 01/06/2019 08/30/2018  Falls in the past year? 0 0 0 0 0  Number falls in past yr: 0 0 0 0 0  Injury with Fall? 0 0 0 0 0   Is the patient's home free of loose throw rugs in walkways, pet beds, electrical cords, etc?   yes      Grab bars in the bathroom? yes      Handrails on the stairs?   no Stairs       Adequate lighting?   yes  Depression Screen PHQ 2/9 Scores 11/29/2019 05/12/2019 01/06/2019 04/29/2018  PHQ - 2 Score 0 0 0 0    Cognitive Function MMSE - Mini Mental State Exam 03/06/2016  Orientation to time 5  Orientation to Place 5  Registration 3  Attention/ Calculation 4  Recall 2  Language- name 2 objects 2  Language- repeat 1  Language- follow 3 step command 3  Language- read & follow direction 1  Write a sentence 1  Copy design 0  Total score 27     6CIT Screen 11/29/2019  What Year? 0 points  What month? 0 points  What time? 0 points  Count back from 20 0 points  Months in reverse 0 points  Repeat phrase 0 points  Total Score 0    Immunization History   Administered Date(s) Administered  . Fluad Quad(high Dose 65+) 07/20/2019  . Influenza, High Dose  Seasonal PF 07/25/2016, 07/09/2017, 08/26/2018  . Influenza,inj,Quad PF,6+ Mos 07/04/2013  . Influenza-Unspecified 07/13/2014, 07/28/2015  . Pneumococcal Conjugate-13 08/17/2014  . Pneumococcal Polysaccharide-23 08/08/2013  . Tdap 04/12/2013  . Zoster 08/08/2013    Qualifies for Shingles Vaccine? Declined  Screening Tests Health Maintenance  Topic Date Due  . Hepatitis C Screening  [birth year]  . HEMOGLOBIN A1C  03/11/2020  . OPHTHALMOLOGY EXAM  07/17/2020  . FOOT EXAM  08/29/2020  . TETANUS/TDAP  04/13/2023  . COLONOSCOPY  10/19/2028  . INFLUENZA VACCINE  Completed  . PNA vac Low Risk Adult  Completed   Cancer Screenings: Lung: Low Dose CT Chest recommended if Age 59-80 years, 30 pack-year currently smoking OR have quit w/in 15years. Patient does not qualify. Colorectal: Up to date due 10/19/2028   Additional Screenings: Hepatitis C Screening:Low Risk       Plan:   - Hep C with next lab work.   I have personally reviewed and noted the following in the patient's chart:   . Medical and social history . Use of alcohol, tobacco or illicit drugs  . Current medications and supplements . Functional ability and status . Nutritional status . Physical activity . Advanced directives . List of other physicians . Hospitalizations, surgeries, and ER visits in previous 12 months . Vitals . Screenings to include cognitive, depression, and falls . Referrals and appointments  In addition, I have reviewed and discussed with patient certain preventive protocols, quality metrics, and best practice recommendations. A written personalized care plan for preventive services as well as general preventive health recommendations were provided to patient.    Sandrea Hughs, NP  11/29/2019

## 2019-12-05 DIAGNOSIS — D126 Benign neoplasm of colon, unspecified: Secondary | ICD-10-CM | POA: Diagnosis not present

## 2019-12-05 DIAGNOSIS — Z794 Long term (current) use of insulin: Secondary | ICD-10-CM | POA: Diagnosis not present

## 2019-12-05 DIAGNOSIS — E221 Hyperprolactinemia: Secondary | ICD-10-CM | POA: Diagnosis not present

## 2019-12-05 DIAGNOSIS — I129 Hypertensive chronic kidney disease with stage 1 through stage 4 chronic kidney disease, or unspecified chronic kidney disease: Secondary | ICD-10-CM | POA: Diagnosis not present

## 2019-12-05 DIAGNOSIS — E785 Hyperlipidemia, unspecified: Secondary | ICD-10-CM | POA: Diagnosis not present

## 2019-12-05 DIAGNOSIS — E291 Testicular hypofunction: Secondary | ICD-10-CM | POA: Diagnosis not present

## 2019-12-05 DIAGNOSIS — E1129 Type 2 diabetes mellitus with other diabetic kidney complication: Secondary | ICD-10-CM | POA: Diagnosis not present

## 2019-12-05 DIAGNOSIS — N1831 Chronic kidney disease, stage 3a: Secondary | ICD-10-CM | POA: Diagnosis not present

## 2019-12-05 DIAGNOSIS — D352 Benign neoplasm of pituitary gland: Secondary | ICD-10-CM | POA: Diagnosis not present

## 2019-12-09 DIAGNOSIS — D352 Benign neoplasm of pituitary gland: Secondary | ICD-10-CM | POA: Diagnosis not present

## 2019-12-11 ENCOUNTER — Ambulatory Visit: Payer: Medicare Other | Attending: Internal Medicine

## 2019-12-11 DIAGNOSIS — Z23 Encounter for immunization: Secondary | ICD-10-CM

## 2019-12-11 NOTE — Progress Notes (Signed)
   Covid-19 Vaccination Clinic  Name:  Adrian Matthews    MRN: [id number] DOB: [birth year]  12/11/2019  Adrian Matthews was observed post Covid-19 immunization for 15 minutes without incidence. He was provided with Vaccine Information Sheet and instruction to access the V-Safe system.   Adrian Matthews was instructed to call 911 with any severe reactions post vaccine: Marland Kitchen Difficulty breathing  . Swelling of your face and throat  . A fast heartbeat  . A bad rash all over your body  . Dizziness and weakness    Immunizations Administered    Name Date Dose VIS Date Route   Pfizer COVID-19 Vaccine 12/11/2019  9:43 AM 0.3 mL 09/23/2019 Intramuscular   Manufacturer: [place]   Lot: [id number]   [place]: [id number]

## 2019-12-12 ENCOUNTER — Telehealth: Payer: Self-pay | Admitting: Podiatry

## 2019-12-12 NOTE — Telephone Encounter (Signed)
Patient wanted to speak with you, he said his foot has swollen up again, and you gave him some medication that made it go down before. Just wanted to talk to you

## 2019-12-13 ENCOUNTER — Other Ambulatory Visit: Payer: Self-pay | Admitting: Internal Medicine

## 2019-12-13 ENCOUNTER — Telehealth: Payer: Self-pay | Admitting: Internal Medicine

## 2019-12-13 ENCOUNTER — Telehealth: Payer: Self-pay | Admitting: *Deleted

## 2019-12-13 DIAGNOSIS — K219 Gastro-esophageal reflux disease without esophagitis: Secondary | ICD-10-CM

## 2019-12-13 DIAGNOSIS — M109 Gout, unspecified: Secondary | ICD-10-CM

## 2019-12-13 MED ORDER — INDOMETHACIN 50 MG PO CAPS: 50.0000 mg | ORAL_CAPSULE | Freq: Two times a day (BID) | ORAL | 0 refills | Status: AC

## 2019-12-13 NOTE — Telephone Encounter (Signed)
Spoke with patient and he only wants to come in for the BMP, pt doesn't want to be seen, he's in pain and "I don't want nobody looking at my feet, they just swollen" per pt, is it ok to just order the BMP?

## 2019-12-13 NOTE — Addendum Note (Signed)
Addended by: [name] on: 12/13/2019 11:21 AM   Modules accepted: Orders

## 2019-12-13 NOTE — Telephone Encounter (Signed)
Adrian Matthews called podiatry, Dr. Elisha Ponder, with concerns that he had a gout flare and she had planned to prescribe him some indomethacin.  I suggested that our office could see him b/c of his CKD so we can keep an eye on his kidneys with the nsaid.  She agrees that it's a good idea for him to be seen by NP at [facility] for evaluation.  Please call him and get him in for an appt with NP if he's willing so a BMP can be checked at baseline and followed up while on treatment.

## 2019-12-13 NOTE — Addendum Note (Signed)
Addended by: [name] on: 12/13/2019 11:16 AM   Modules accepted: Orders

## 2019-12-13 NOTE — Telephone Encounter (Signed)
I spoke with pt and informed that dr. Elisha Ponder had left a message for Dr. Mariea Clonts and his office would contact pt. Pt states Dr. Mariea Clonts called in a medication and he is to go in 12/21/2019 for blood work.

## 2019-12-13 NOTE — Telephone Encounter (Signed)
I suppose.   Dr. Elisha Ponder had recommended Indomethacin 50 mg po bid x 7 days so we can send that in for Adrian Matthews.  Let's have Adrian Matthews come for the BMP on thurs or fri after he's taken the medication to make sure it is not adversely affecting his kidneys.  He needs to hydrate especially well.

## 2019-12-13 NOTE — Telephone Encounter (Signed)
Pt states he was to have a medication sent to the pharmacy and it is not there.

## 2019-12-13 NOTE — Telephone Encounter (Signed)
Spoke with patient and advised results rx sent to pharmacy by e-script Lab ordered

## 2019-12-21 ENCOUNTER — Other Ambulatory Visit: Payer: Medicare Other

## 2019-12-21 ENCOUNTER — Other Ambulatory Visit: Payer: Self-pay

## 2019-12-21 DIAGNOSIS — M109 Gout, unspecified: Secondary | ICD-10-CM | POA: Diagnosis not present

## 2019-12-22 LAB — BASIC METABOLIC PANEL WITH GFR
BUN/Creatinine Ratio: 16 (calc) (ref 6–22)
BUN: 22 mg/dL (ref 7–25)
CO2: 29 mmol/L (ref 20–32)
Calcium: 10 mg/dL (ref 8.6–10.3)
Chloride: 99 mmol/L (ref 98–110)
Creat: 1.38 mg/dL — ABNORMAL HIGH (ref 0.70–1.18)
GFR, Est African American: 59 mL/min/{1.73_m2} — ABNORMAL LOW (ref >=60)
GFR, Est Non African American: 51 mL/min/{1.73_m2} — ABNORMAL LOW (ref >=60)
Glucose, Bld: 131 mg/dL — ABNORMAL HIGH (ref 65–99)
Potassium: 3.8 mmol/L (ref 3.5–5.3)
Sodium: 140 mmol/L (ref 135–146)

## 2019-12-22 NOTE — Progress Notes (Signed)
Fortunately kidneys stayed stable with the indomethacin use for gout.

## 2020-01-10 ENCOUNTER — Ambulatory Visit: Payer: Medicare Other | Attending: Internal Medicine

## 2020-01-10 DIAGNOSIS — Z23 Encounter for immunization: Secondary | ICD-10-CM

## 2020-01-10 NOTE — Progress Notes (Signed)
   Covid-19 Vaccination Clinic  Name:  Adrian Matthews    MRN: [id number] DOB: 1945-12-08  01/10/2020  [name] was observed post Covid-19 immunization for 15 minutes without incident. He was provided with Vaccine Information Sheet and instruction to access the V-Safe system.   [name] was instructed to call 911 with any severe reactions post vaccine: Marland Kitchen Difficulty breathing  . Swelling of face and throat  . A fast heartbeat  . A bad rash all over body  . Dizziness and weakness   Immunizations Administered    Name Date Dose VIS Date Route   Pfizer COVID-19 Vaccine 01/10/2020  8:20 AM 0.3 mL 09/23/2019 Intramuscular   Manufacturer: [place]   Lot: [id number]   [place]: [id number]

## 2020-01-12 ENCOUNTER — Other Ambulatory Visit: Payer: Self-pay

## 2020-01-12 ENCOUNTER — Other Ambulatory Visit: Payer: Medicare Other

## 2020-01-12 DIAGNOSIS — N182 Chronic kidney disease, stage 2 (mild): Secondary | ICD-10-CM | POA: Diagnosis not present

## 2020-01-12 DIAGNOSIS — Z1159 Encounter for screening for other viral diseases: Secondary | ICD-10-CM | POA: Diagnosis not present

## 2020-01-12 DIAGNOSIS — I10 Essential (primary) hypertension: Secondary | ICD-10-CM | POA: Diagnosis not present

## 2020-01-12 DIAGNOSIS — E1122 Type 2 diabetes mellitus with diabetic chronic kidney disease: Secondary | ICD-10-CM

## 2020-01-12 DIAGNOSIS — E0822 Diabetes mellitus due to underlying condition with diabetic chronic kidney disease: Secondary | ICD-10-CM

## 2020-01-12 DIAGNOSIS — E785 Hyperlipidemia, unspecified: Secondary | ICD-10-CM | POA: Diagnosis not present

## 2020-01-12 DIAGNOSIS — E1169 Type 2 diabetes mellitus with other specified complication: Secondary | ICD-10-CM

## 2020-01-12 DIAGNOSIS — D72829 Elevated white blood cell count, unspecified: Secondary | ICD-10-CM

## 2020-01-12 DIAGNOSIS — Z794 Long term (current) use of insulin: Secondary | ICD-10-CM | POA: Diagnosis not present

## 2020-01-12 DIAGNOSIS — Z6841 Body Mass Index (BMI) 40.0 and over, adult: Secondary | ICD-10-CM | POA: Diagnosis not present

## 2020-01-13 LAB — COMPLETE METABOLIC PANEL WITH GFR
AG Ratio: 1 (calc) (ref 1.0–2.5)
ALT: 15 U/L (ref 9–46)
AST: 16 U/L (ref 10–35)
Albumin: 3.7 g/dL (ref 3.6–5.1)
Alkaline phosphatase (APISO): 81 U/L (ref 35–144)
BUN/Creatinine Ratio: 12 (calc) (ref 6–22)
BUN: 18 mg/dL (ref 7–25)
CO2: 30 mmol/L (ref 20–32)
Calcium: 10.1 mg/dL (ref 8.6–10.3)
Chloride: 97 mmol/L — ABNORMAL LOW (ref 98–110)
Creat: 1.52 mg/dL — ABNORMAL HIGH (ref 0.70–1.18)
GFR, Est African American: 52 mL/min/{1.73_m2} — ABNORMAL LOW (ref >=60)
GFR, Est Non African American: 45 mL/min/{1.73_m2} — ABNORMAL LOW (ref >=60)
Globulin: 3.6 g/dL (calc) (ref 1.9–3.7)
Glucose, Bld: 158 mg/dL — ABNORMAL HIGH (ref 65–99)
Potassium: 3.5 mmol/L (ref 3.5–5.3)
Sodium: 139 mmol/L (ref 135–146)
Total Bilirubin: 0.3 mg/dL (ref 0.2–1.2)
Total Protein: 7.3 g/dL (ref 6.1–8.1)

## 2020-01-13 LAB — CBC WITH DIFFERENTIAL/PLATELET
Absolute Monocytes: 385 cells/uL (ref 200–950)
Basophils Absolute: 42 cells/uL (ref 0–200)
Basophils Relative: 0.4 %
Eosinophils Absolute: 198 cells/uL (ref 15–500)
Eosinophils Relative: 1.9 %
HCT: 40.7 % (ref 38.5–50.0)
Hemoglobin: 12.6 g/dL — ABNORMAL LOW (ref 13.2–17.1)
Lymphs Abs: 4618 cells/uL — ABNORMAL HIGH (ref 850–3900)
MCH: 25 pg — ABNORMAL LOW (ref 27.0–33.0)
MCHC: 31 g/dL — ABNORMAL LOW (ref 32.0–36.0)
MCV: 80.6 fL (ref 80.0–100.0)
MPV: 10.2 fL (ref 7.5–12.5)
Monocytes Relative: 3.7 %
Neutro Abs: 5158 cells/uL (ref 1500–7800)
Neutrophils Relative %: 49.6 %
Platelets: 280 10*3/uL (ref 140–400)
RBC: 5.05 10*6/uL (ref 4.20–5.80)
RDW: 16.9 % — ABNORMAL HIGH (ref 11.0–15.0)
Total Lymphocyte: 44.4 %
WBC: 10.4 10*3/uL (ref 3.8–10.8)

## 2020-01-13 LAB — HEPATITIS C ANTIBODY
Hepatitis C Ab: NONREACTIVE
SIGNAL TO CUT-OFF: 0.04 (ref <1.00)

## 2020-01-13 LAB — HEMOGLOBIN A1C
Hgb A1c MFr Bld: 7.9 % of total Hgb — ABNORMAL HIGH (ref <5.7)
Mean Plasma Glucose: 180 (calc)
eAG (mmol/L): 10 (calc)

## 2020-01-17 ENCOUNTER — Other Ambulatory Visit: Payer: Self-pay | Admitting: Internal Medicine

## 2020-01-20 ENCOUNTER — Encounter: Payer: Self-pay | Admitting: Internal Medicine

## 2020-01-20 ENCOUNTER — Ambulatory Visit (INDEPENDENT_AMBULATORY_CARE_PROVIDER_SITE_OTHER): Payer: Medicare Other | Admitting: Internal Medicine

## 2020-01-20 ENCOUNTER — Other Ambulatory Visit: Payer: Self-pay

## 2020-01-20 DIAGNOSIS — I10 Essential (primary) hypertension: Secondary | ICD-10-CM | POA: Diagnosis not present

## 2020-01-20 DIAGNOSIS — M1A3790 Chronic gout due to renal impairment, unspecified ankle and foot, without tophus (tophi): Secondary | ICD-10-CM

## 2020-01-20 DIAGNOSIS — E1169 Type 2 diabetes mellitus with other specified complication: Secondary | ICD-10-CM | POA: Diagnosis not present

## 2020-01-20 DIAGNOSIS — Z794 Long term (current) use of insulin: Secondary | ICD-10-CM | POA: Diagnosis not present

## 2020-01-20 DIAGNOSIS — E162 Hypoglycemia, unspecified: Secondary | ICD-10-CM | POA: Diagnosis not present

## 2020-01-20 DIAGNOSIS — E1140 Type 2 diabetes mellitus with diabetic neuropathy, unspecified: Secondary | ICD-10-CM

## 2020-01-20 DIAGNOSIS — E785 Hyperlipidemia, unspecified: Secondary | ICD-10-CM | POA: Diagnosis not present

## 2020-01-20 NOTE — Progress Notes (Signed)
Patient ID: Adrian Matthews, male   DOB: December 15, 1945, 74 y.o.   MRN: [id number] This service is provided via telemedicine  No vital signs collected/recorded due to the encounter was a telemedicine visit.   Location of patient (ex: home, work):  Home  Patient consents to a telephone visit: yes   Location of the provider (ex: office, home): [facility] Name of any referring provider: Dr. Hollace Kinnier DO  Names of all persons participating in the telemedicine service and their role in the encounter: Jaquese Irving Ileene Rubens CMA, patient.   Time spent on call:8 min with  Medical assistant.     Provider:  Rexene Edison. Mariea Clonts, D.O., C.M.D.  Goals of Care:  Advanced Directives 01/20/2020  Does Patient Have a Medical Advance Directive? No  Type of Advance Directive -  Does patient want to make changes to medical advance directive? -  Copy of [place] in Chart? -  Would patient like information on creating a medical advance directive? -  Pre-existing out of facility DNR order (yellow form or pink MOST form) -     Chief Complaint  Patient presents with  . Medical Management of Chronic Issues    4 month follow up w/ lab results.     HPI: Patient is a 74 y.o. male without capability to do virtual visit only phone visit with [place] today.    He has just gotten in from his walk and the grocery store.    Wonders if there is something to help his blood pressure more.  BP high 139-145/79-80.  His goal is to go back to the gym after his second shot is in his system two weeks.  He thinks that will help his hba1c also.  He ate veggies, small piece of protein at 6pm, but then woke up jittery and CBG was 48.  He does not get hungry for a snack at 9pm.  Not the first time this happened.  He holds his night SS insulin if he eats a meal w/o many carbs at all--takes the scheduled humalog though.    Before lunch 120-140, before supper about 120 If does the fruit with the evening meal, sugar does  better. He is getting premade meals, low carb and low sodium.  [employer] is where they come from.  His neuropathy bothers him at night and has to kick off the covers.  Then gets cool early am and puts covers back on.  He has the gabapentin prescribed by neurology in the past, but he takes them occasionally.   Discussed using one at night.  He reports it was originally for headaches.    He's taking his gout medicine regularly and that's helping.  He's not getting swelling in his feet like he did.     Past Medical History:  Diagnosis Date  . Asymptomatic varicose veins   . Chronic kidney disease   . Diaphragmatic hernia without mention of obstruction or gangrene   . DM (diabetes mellitus) ([place])   . GERD (gastroesophageal reflux disease)   . Gout, unspecified   . Hypercalcemia   . Hyperlipidemia   . Hyperosmolality and/or hypernatremia   . Hypertension   . Monoclonal paraproteinemia   . New daily persistent headache   . Osteoarthrosis, unspecified whether generalized or localized, lower leg   . Other malaise and fatigue   . Primary localized osteoarthrosis, unspecified site   . Unspecified disorder of kidney and ureter   . Unspecified hereditary and idiopathic peripheral neuropathy  Past Surgical History:  Procedure Laterality Date  . BIOPSY  10/19/2018   Procedure: BIOPSY;  Surgeon: Ronald Lobo, MD;  Location: [facility] ENDOSCOPY;  Service: Endoscopy;;  . CARPAL TUNNEL RELEASE  2007  . COLONOSCOPY N/A 06/06/2013   Procedure: COLONOSCOPY;  Surgeon: Cleotis Nipper, MD;  Location: [facility] ENDOSCOPY;  Service: Endoscopy;  Laterality: N/A;  . COLONOSCOPY WITH PROPOFOL N/A 10/19/2018   Procedure: COLONOSCOPY WITH PROPOFOL;  Surgeon: Ronald Lobo, MD;  Location: [facility] ENDOSCOPY;  Service: Endoscopy;  Laterality: N/A;  . POLYPECTOMY  10/19/2018   Procedure: POLYPECTOMY;  Surgeon: Ronald Lobo, MD;  Location: [facility] ENDOSCOPY;  Service: Endoscopy;;  . TUMOR REMOVAL  2017   Pituitary adenoma    No  Known Allergies  Outpatient Encounter Medications as of 01/20/2020  Medication Sig  . allopurinol (ZYLOPRIM) 100 MG tablet TAKE 1 TABLET DAILY  . amLODipine (NORVASC) 10 MG tablet TAKE 1 TABLET DAILY FOR BLOOD PRESSURE  . aspirin EC 81 MG tablet Take 1 tablet (81 mg total) by mouth daily.  Marland Kitchen atorvastatin (LIPITOR) 40 MG tablet TAKE 1 TABLET DAILY  . chlorthalidone (HYGROTON) 25 MG tablet TAKE 1 TABLET DAILY FOR BLOOD PRESSURE  . Cholecalciferol (VITAMIN D3) 2000 units TABS Take 2,000 Units by mouth daily.   Marland Kitchen FREESTYLE LITE test strip CHECK BLOOD SUGAR THREE TIMES A DAY  . gabapentin (NEURONTIN) 300 MG capsule Take 1 capsule (300 mg total) by mouth 3 (three) times daily.  . Insulin Glargine (LANTUS SOLOSTAR) 100 UNIT/ML Solostar Pen INJECT 66 UNITS UNDER THE SKIN DAILY  . insulin lispro (HUMALOG KWIKPEN) 100 UNIT/ML KwikPen Inject 22 units before breakfast, 26 units before lunch & supper-plus sliding scale  . Insulin Pen Needle (SURE COMFORT PEN NEEDLES) 32G X 4 MM MISC Use 4 times daily before meals DX: E11.65  . irbesartan (AVAPRO) 300 MG tablet Take 1 tablet (300 mg total) by mouth daily.  . Omega-3 Fatty Acids (FISH OIL) 1200 MG CAPS Take 1,200 mg by mouth daily.   . pantoprazole (PROTONIX) 40 MG tablet TAKE 1 TABLET DAILY  . potassium chloride SA (K-DUR) 20 MEQ tablet TAKE 1 TABLET TWICE A DAY   No facility-administered encounter medications on file as of 01/20/2020.    Review of Systems:  Review of Systems  Constitutional: Negative for chills, fever and malaise/fatigue.  HENT: Negative for congestion and sore throat.   Eyes: Negative for blurred vision.  Respiratory: Negative for cough and shortness of breath.   Cardiovascular: Negative for chest pain, palpitations and leg swelling.  Gastrointestinal: Negative for abdominal pain, blood in stool, constipation, diarrhea and melena.  Genitourinary: Negative for dysuria.  Musculoskeletal: Positive for joint pain. Negative for falls.   Neurological: Positive for tingling and sensory change. Negative for dizziness and loss of consciousness.  Psychiatric/Behavioral: Negative for depression and memory loss. The patient is not nervous/anxious and does not have insomnia.     Health Maintenance  Topic Date Due  . INFLUENZA VACCINE  05/13/2020  . HEMOGLOBIN A1C  07/13/2020  . OPHTHALMOLOGY EXAM  07/17/2020  . FOOT EXAM  08/29/2020  . TETANUS/TDAP  04/13/2023  . COLONOSCOPY  10/19/2028  . Hepatitis C Screening  Completed  . PNA vac Low Risk Adult  Completed    Physical Exam: Could not be performed as visit non face-to-face via phone   Labs reviewed: Basic Metabolic Panel: Recent Labs    09/29/19 0929 12/21/19 0805 01/12/20 0816  NA 141 140 139  K 3.8 3.8 3.5  CL 102  99 97*  CO2 28 29 30   GLUCOSE 158* 131* 158*  BUN 18 22 18   CREATININE 1.33* 1.38* 1.52*  CALCIUM 9.7 10.0 10.1   Liver Function Tests: Recent Labs    01/12/20 0816  AST 16  ALT 15  BILITOT 0.3  PROT 7.3   No results for input(s): LIPASE, AMYLASE in the last 8760 hours. No results for input(s): AMMONIA in the last 8760 hours. CBC: Recent Labs    09/12/19 0828 09/29/19 0925 01/12/20 0816  WBC 12.6* 5.3 10.4  NEUTROABS 6,426 1,675 5,158  HGB 12.4* 12.0* 12.6*  HCT 39.2 37.1* 40.7  MCV 79.8* 83.0 80.6  PLT 266 223 280   Lipid Panel: Recent Labs    09/12/19 0828  CHOL 149  HDL 43  LDLCALC 88  TRIG 89  CHOLHDL 3.5   Lab Results  Component Value Date   HGBA1C 7.9 (H) 01/12/2020   Assessment/Plan 1. Type 2 diabetes mellitus with diabetic neuropathy, with long-term current use of insulin (HCC) -sugar average trended up with less exercise (not going to the gym) amid covid pandemic -he's vaccinated and plans to return -cont same insulin regimen, ARB, asa and high intensity statin  2. Obesity, morbid (more than 100 lbs over ideal weight or BMI > 40) (HCC) -ongoing with upward trend amid pandemic that started even before  (since his pituitary surgery) -encouraged him with his plans to return to the gym after vaccination kicks in fully  3. Hyperlipidemia associated with type 2 diabetes mellitus (HCC) -LDL was above goal last check 4 mos ago--f/u next time before I see him in August after he exercises more  4. Chronic gout due to renal impairment involving foot without tophus, unspecified laterality -continue daily allopurinol long-term (had flare when stopped)  5. Hypoglycemia -has had a few episodes at night with attempts to eat fewer carbs with evening meals -he thinks this will improve when his metabolism increases from exercise at the gym -he's to call back if it persists so evening humalog can be reduced--other CBGs sound good (lower than one would expect with hba1c 7.9)  6. Essential hypertension -bp slightly above goal recently at home  -again, he wants to wait and see if his bp improves with regular exercise at the gym and getting back to his prior routine  Labs/tests ordered:  Lab Orders     Hemoglobin A1c     COMPLETE METABOLIC PANEL WITH GFR     CBC with Differential/Platelet     Lipid panel  Next appt:  05/24/2020  Non face-to-face time spent on televisit:  28 mins  Jayvien Rowlette L. Nester Bachus, D.O. [facility] [address], [place] [address] Cell Phone (Mon-Fri 8am-5pm):  [phone] On Call:  [phone] & follow prompts after 5pm & weekends Office Phone:  [phone] Office Fax:  [phone]

## 2020-01-20 NOTE — Patient Instructions (Signed)
Call me back if you are having lows overnight when you get back into your gym routine.  We may need to reduce your evening humalog down to 22 units if that's the case.  Also call back if your blood pressures are not improving with your exercise program.  Try using the gabapentin at bedtime for your neuropathy.  If it's working, we can renew it for you.

## 2020-02-16 ENCOUNTER — Other Ambulatory Visit: Payer: Self-pay | Admitting: Internal Medicine

## 2020-02-22 ENCOUNTER — Encounter: Payer: Self-pay | Admitting: Podiatry

## 2020-02-22 ENCOUNTER — Other Ambulatory Visit: Payer: Self-pay

## 2020-02-22 ENCOUNTER — Other Ambulatory Visit: Payer: Self-pay | Admitting: *Deleted

## 2020-02-22 ENCOUNTER — Ambulatory Visit (INDEPENDENT_AMBULATORY_CARE_PROVIDER_SITE_OTHER): Payer: Medicare Other | Admitting: Podiatry

## 2020-02-22 VITALS — Temp 97.9°F

## 2020-02-22 DIAGNOSIS — M79674 Pain in right toe(s): Secondary | ICD-10-CM | POA: Diagnosis not present

## 2020-02-22 DIAGNOSIS — M79675 Pain in left toe(s): Secondary | ICD-10-CM | POA: Diagnosis not present

## 2020-02-22 DIAGNOSIS — B351 Tinea unguium: Secondary | ICD-10-CM | POA: Diagnosis not present

## 2020-02-22 MED ORDER — GABAPENTIN 300 MG PO CAPS: 300.0000 mg | ORAL_CAPSULE | Freq: Three times a day (TID) | ORAL | 1 refills | Status: DC

## 2020-02-22 NOTE — Telephone Encounter (Signed)
Patient requested refill on his Gabapentin. Neurology prescribed in the past.  Pended Rx and sent to Dr. Mariea Clonts for approval.

## 2020-02-22 NOTE — Patient Instructions (Signed)
Diabetes Mellitus and Foot Care Foot care is an important part of your health, especially when you have diabetes. Diabetes may cause you to have problems because of poor blood flow (circulation) to your feet and legs, which can cause your skin to:  Become thinner and drier.  Break more easily.  Heal more slowly.  Peel and crack. You may also have nerve damage (neuropathy) in your legs and feet, causing decreased feeling in them. This means that you may not notice minor injuries to your feet that could lead to more serious problems. Noticing and addressing any potential problems early is the best way to prevent future foot problems. How to care for your feet Foot hygiene  Wash your feet daily with warm water and mild soap. Do not use hot water. Then, pat your feet and the areas between your toes until they are completely dry. Do not soak your feet as this can dry your skin.  Trim your toenails straight across. Do not dig under them or around the cuticle. File the edges of your nails with an emery board or nail file.  Apply a moisturizing lotion or petroleum jelly to the skin on your feet and to dry, brittle toenails. Use lotion that does not contain alcohol and is unscented. Do not apply lotion between your toes. Shoes and socks  Wear clean socks or stockings every day. Make sure they are not too tight. Do not wear knee-high stockings since they may decrease blood flow to your legs.  Wear shoes that fit properly and have enough cushioning. Always look in your shoes before you put them on to be sure there are no objects inside.  To break in new shoes, wear them for just a few hours a day. This prevents injuries on your feet. Wounds, scrapes, corns, and calluses  Check your feet daily for blisters, cuts, bruises, sores, and redness. If you cannot see the bottom of your feet, use a mirror or ask someone for help.  Do not cut corns or calluses or try to remove them with medicine.  If you  find a minor scrape, cut, or break in the skin on your feet, keep it and the skin around it clean and dry. You may clean these areas with mild soap and water. Do not clean the area with peroxide, alcohol, or iodine.  If you have a wound, scrape, corn, or callus on your foot, look at it several times a day to make sure it is healing and not infected. Check for: ? Redness, swelling, or pain. ? Fluid or blood. ? Warmth. ? Pus or a bad smell. General instructions  Do not cross your legs. This may decrease blood flow to your feet.  Do not use heating pads or hot water bottles on your feet. They may burn your skin. If you have lost feeling in your feet or legs, you may not know this is happening until it is too late.  Protect your feet from hot and cold by wearing shoes, such as at the beach or on hot pavement.  Schedule a complete foot exam at least once a year (annually) or more often if you have foot problems. If you have foot problems, report any cuts, sores, or bruises to your health care provider immediately. Contact a health care provider if:  You have a medical condition that increases your risk of infection and you have any cuts, sores, or bruises on your feet.  You have an injury that is not   healing.  You have redness on your legs or feet.  You feel burning or tingling in your legs or feet.  You have pain or cramps in your legs and feet.  Your legs or feet are numb.  Your feet always feel cold.  You have pain around a toenail. Get help right away if:  You have a wound, scrape, corn, or callus on your foot and: ? You have pain, swelling, or redness that gets worse. ? You have fluid or blood coming from the wound, scrape, corn, or callus. ? Your wound, scrape, corn, or callus feels warm to the touch. ? You have pus or a bad smell coming from the wound, scrape, corn, or callus. ? You have a fever. ? You have a red line going up your leg. Summary  Check your feet every day  for cuts, sores, red spots, swelling, and blisters.  Moisturize feet and legs daily.  Wear shoes that fit properly and have enough cushioning.  If you have foot problems, report any cuts, sores, or bruises to your health care provider immediately.  Schedule a complete foot exam at least once a year (annually) or more often if you have foot problems. This information is not intended to replace advice given to you by your health care provider. Make sure you discuss any questions you have with your health care provider. Document Revised: 06/22/2019 Document Reviewed: 10/31/2016 Elsevier Patient Education  2020 [employer]  Peripheral Neuropathy Peripheral neuropathy is a type of nerve damage. It affects nerves that carry signals between the spinal cord and the arms, legs, and the rest of the body (peripheral nerves). It does not affect nerves in the spinal cord or brain. In peripheral neuropathy, one nerve or a group of nerves may be damaged. Peripheral neuropathy is a broad category that includes many specific nerve disorders, like diabetic neuropathy, hereditary neuropathy, and carpal tunnel syndrome. What are the causes? This condition may be caused by:  Diabetes. This is the most common cause of peripheral neuropathy.  Nerve injury.  Pressure or stress on a nerve that lasts a long time.  Lack (deficiency) of B vitamins. This can result from alcoholism, poor diet, or a restricted diet.  Infections.  Autoimmune diseases, such as rheumatoid arthritis and systemic lupus erythematosus.  Nerve diseases that are passed from parent to child (inherited).  Some medicines, such as cancer medicines (chemotherapy).  Poisonous (toxic) substances, such as lead and mercury.  Too little blood flowing to the legs.  Kidney disease.  Thyroid disease. In some cases, the cause of this condition is not known. What are the signs or symptoms? Symptoms of this condition depend on which of your  nerves is damaged. Common symptoms include:  Loss of feeling (numbness) in the feet, hands, or both.  Tingling in the feet, hands, or both.  Burning pain.  Very sensitive skin.  Weakness.  Not being able to move a part of the body (paralysis).  Muscle twitching.  Clumsiness or poor coordination.  Loss of balance.  Not being able to control your bladder.  Feeling dizzy.  Sexual problems. How is this diagnosed? Diagnosing and finding the cause of peripheral neuropathy can be difficult. Your health care provider will take your medical history and do a physical exam. A neurological exam will also be done. This involves checking things that are affected by your brain, spinal cord, and nerves (nervous system). For example, your health care provider will check your reflexes, how you move, and   what you can feel. You may have other tests, such as:  Blood tests.  Electromyogram (EMG) and nerve conduction tests. These tests check nerve function and how well the nerves are controlling the muscles.  Imaging tests, such as CT scans or MRI to rule out other causes of your symptoms.  Removing a small piece of nerve to be examined in a lab (nerve biopsy). This is rare.  Removing and examining a small amount of the fluid that surrounds the brain and spinal cord (lumbar puncture). This is rare. How is this treated? Treatment for this condition may involve:  Treating the underlying cause of the neuropathy, such as diabetes, kidney disease, or vitamin deficiencies.  Stopping medicines that can cause neuropathy, such as chemotherapy.  Medicine to relieve pain. Medicines may include: ? Prescription or over-the-counter pain medicine. ? Antiseizure medicine. ? Antidepressants. ? Pain-relieving patches that are applied to painful areas of skin.  Surgery to relieve pressure on a nerve or to destroy a nerve that is causing pain.  Physical therapy to help improve movement and  balance.  Devices to help you move around (assistive devices). Follow these instructions at home: Medicines  Take over-the-counter and prescription medicines only as told by your health care provider. Do not take any other medicines without first asking your health care provider.  Do not drive or use heavy machinery while taking prescription pain medicine. Lifestyle   Do not use any products that contain nicotine or tobacco, such as cigarettes and e-cigarettes. Smoking keeps blood from reaching damaged nerves. If you need help quitting, ask your health care provider.  Avoid or limit alcohol. Too much alcohol can cause a vitamin B deficiency, and vitamin B is needed for healthy nerves.  Eat a healthy diet. This includes: ? Eating foods that are high in fiber, such as fresh fruits and vegetables, whole grains, and beans. ? Limiting foods that are high in fat and processed sugars, such as fried or sweet foods. General instructions   If you have diabetes, work closely with your health care provider to keep your blood sugar under control.  If you have numbness in your feet: ? Check every day for signs of injury or infection. Watch for redness, warmth, and swelling. ? Wear padded socks and comfortable shoes. These help protect your feet.  Develop a good support system. Living with peripheral neuropathy can be stressful. Consider talking with a mental health specialist or joining a support group.  Use assistive devices and attend physical therapy as told by your health care provider. This may include using a walker or a cane.  Keep all follow-up visits as told by your health care provider. This is important. Contact a health care provider if:  You have new signs or symptoms of peripheral neuropathy.  You are struggling emotionally from dealing with peripheral neuropathy.  Your pain is not well-controlled. Get help right away if:  You have an injury or infection that is not healing  normally.  You develop new weakness in an arm or leg.  You fall frequently. Summary  Peripheral neuropathy is when the nerves in the arms, or legs are damaged, resulting in numbness, weakness, or pain.  There are many causes of peripheral neuropathy, including diabetes, pinched nerves, vitamin deficiencies, autoimmune disease, and hereditary conditions.  Diagnosing and finding the cause of peripheral neuropathy can be difficult. Your health care provider will take your medical history, do a physical exam, and do tests, including blood tests and nerve function tests.    Treatment involves treating the underlying cause of the neuropathy and taking medicines to help control pain. Physical therapy and assistive devices may also help. This information is not intended to replace advice given to you by your health care provider. Make sure you discuss any questions you have with your health care provider. Document Revised: 09/11/2017 Document Reviewed: 12/08/2016 Elsevier Patient Education  2020 [employer] Inc.  

## 2020-02-22 NOTE — Progress Notes (Signed)
Subjective: Adrian Matthews is a 74 y.o. male patient seen today at risk foot care. Pt has h/o NIDDM with chronic kidney disease and painful mycotic nails b/l that are difficult to trim. Pain interferes with ambulation. Aggravating factors include wearing enclosed shoe gear. Pain is relieved with periodic professional debridement.  He voices no new pedal problems on today's visit.  He states he is now taking his Allopurinol daily to prevent gout attack. He voices no new pedal problems on today's visit. States he will start going back to the [facility] for exercise soon.  Patient Active Problem List   Diagnosis Date Noted  . Type 2 diabetes mellitus with diabetic neuropathy, with long-term current use of insulin ([place]) 01/06/2019  . Iron deficiency anemia 08/30/2018  . Generalized osteoarthritis of multiple sites 08/30/2018  . Chronic kidney disease, stage 3 12/17/2017  . Testosterone deficiency in male 08/10/2017  . Pituitary microadenoma with hyperprolactinemia ([place]) 01/02/2017  . Hormone deficiency 01/02/2017  . Hyperprolactinemia ([place]) 11/19/2016  . At risk for obstructive sleep apnea 09/25/2016  . 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  . Morning headache 02/11/2016  . Arthritis 10/27/2014  . Class 3 severe obesity with serious comorbidity and body mass index (BMI) of 50.0 to 59.9 in adult ([place]) 06/04/2013  . Rectal bleeding 06/04/2013  . Osteoarthrosis, unspecified whether generalized or localized, lower leg   . Monoclonal paraproteinemia   . Gout, unspecified   . Type 2 diabetes mellitus with stage 3 chronic kidney disease, with long-term current use of insulin ([place]) 02/28/2013  . Hyperlipidemia associated with type 2 diabetes mellitus ([place]) 02/28/2013  . Essential hypertension 02/28/2013  . Unspecified disorder of kidney and ureter 02/28/2013    Current Outpatient Medications on File Prior to Visit  Medication Sig Dispense Refill  . allopurinol (ZYLOPRIM) 100 MG tablet TAKE 1 TABLET  DAILY 90 tablet 3  . amLODipine (NORVASC) 10 MG tablet TAKE 1 TABLET DAILY FOR BLOOD PRESSURE 90 tablet 3  . aspirin EC 81 MG tablet Take 1 tablet (81 mg total) by mouth daily. 30 tablet 11  . atorvastatin (LIPITOR) 40 MG tablet TAKE 1 TABLET DAILY 90 tablet 3  . chlorthalidone (HYGROTON) 25 MG tablet TAKE 1 TABLET DAILY FOR BLOOD PRESSURE 90 tablet 3  . Cholecalciferol (VITAMIN D3) 2000 units TABS Take 2,000 Units by mouth daily.     Marland Kitchen FREESTYLE LITE test strip CHECK BLOOD SUGAR THREE TIMES A DAY 300 each 3  . gabapentin (NEURONTIN) 300 MG capsule Take 1 capsule (300 mg total) by mouth 3 (three) times daily. 90 capsule 11  . Insulin Glargine (LANTUS SOLOSTAR) 100 UNIT/ML Solostar Pen INJECT 66 UNITS UNDER THE SKIN DAILY 60 mL 3  . insulin lispro (HUMALOG KWIKPEN) 100 UNIT/ML KwikPen Inject 22 units before breakfast, 26 units before lunch & supper-plus sliding scale 25 pen 3  . Insulin Pen Needle (SURE COMFORT PEN NEEDLES) 32G X 4 MM MISC Use 4 times daily before meals DX: E11.65 500 each 1  . irbesartan (AVAPRO) 300 MG tablet Take 1 tablet (300 mg total) by mouth daily. 90 tablet 3  . Omega-3 Fatty Acids (FISH OIL) 1200 MG CAPS Take 1,200 mg by mouth daily.     . pantoprazole (PROTONIX) 40 MG tablet TAKE 1 TABLET DAILY 90 tablet 3  . potassium chloride SA (K-DUR) 20 MEQ tablet TAKE 1 TABLET TWICE A DAY 180 tablet 3   No current facility-administered medications on file prior to visit.    No Known Allergies  Objective: Physical  Exam  General: [name] is a pleasant 74 y.o. y.o. AA male, morbidly obese in NAD. AAO x 3.   Vascular:  Capillary refill time to digits immediate b/l. Palpable DP pulses b/l. Palpable PT pulses b/l. Pedal hair present b/l. Skin temperature gradient within normal limits b/l. No edema noted b/l.  Dermatological:  Pedal skin with normal turgor, texture and tone bilaterally. No open wounds bilaterally. No interdigital macerations bilaterally. Toenails 1-5  b/l elongated, dystrophic, thickened, crumbly with subungual debris and tenderness to dorsal palpation.  Musculoskeletal:  Normal muscle strength 5/5 to all lower extremity muscle groups bilaterally. No gross bony deformities bilaterally. No pain crepitus or joint limitation noted with ROM b/l. Patient ambulates independent of any assistive aids.  Neurological:  Protective sensation intact 5/5 intact bilaterally with 10g monofilament b/l. Vibratory sensation intact b/l. Proprioception intact bilaterally.  Assessment and Plan:  1. Pain due to onychomycosis of toenails of both feet     -Examined patient. -No new findings. No new orders. -Continue diabetic foot care principles. Literature dispensed on today.  -Toenails 1-5 b/l were debrided in length and girth with sterile nail nippers and dremel without iatrogenic bleeding.  -Patient to continue soft, supportive shoe gear daily. -Patient to report any pedal injuries to medical professional immediately. -Patient/POA to call should there be question/concern in the interim.  Return in about 3 months (around 05/24/2020) for diabetic nail trim.  Marzetta Board, DPM

## 2020-03-13 ENCOUNTER — Other Ambulatory Visit: Payer: Self-pay | Admitting: Internal Medicine

## 2020-03-13 DIAGNOSIS — E876 Hypokalemia: Secondary | ICD-10-CM

## 2020-03-13 NOTE — Telephone Encounter (Signed)
rx sent to pharmacy by e-script  

## 2020-04-28 ENCOUNTER — Other Ambulatory Visit: Payer: Self-pay | Admitting: Internal Medicine

## 2020-05-22 ENCOUNTER — Other Ambulatory Visit: Payer: Medicare Other

## 2020-05-24 ENCOUNTER — Ambulatory Visit: Payer: Medicare Other | Admitting: Internal Medicine

## 2020-05-25 ENCOUNTER — Ambulatory Visit (INDEPENDENT_AMBULATORY_CARE_PROVIDER_SITE_OTHER): Payer: Medicare Other | Admitting: Podiatry

## 2020-05-25 ENCOUNTER — Other Ambulatory Visit: Payer: Self-pay

## 2020-05-25 ENCOUNTER — Encounter: Payer: Self-pay | Admitting: Podiatry

## 2020-05-25 DIAGNOSIS — B351 Tinea unguium: Secondary | ICD-10-CM | POA: Diagnosis not present

## 2020-05-25 DIAGNOSIS — M2141 Flat foot [pes planus] (acquired), right foot: Secondary | ICD-10-CM

## 2020-05-25 DIAGNOSIS — M79674 Pain in right toe(s): Secondary | ICD-10-CM

## 2020-05-25 DIAGNOSIS — M2142 Flat foot [pes planus] (acquired), left foot: Secondary | ICD-10-CM

## 2020-05-25 DIAGNOSIS — M79675 Pain in left toe(s): Secondary | ICD-10-CM | POA: Diagnosis not present

## 2020-05-25 DIAGNOSIS — E1142 Type 2 diabetes mellitus with diabetic polyneuropathy: Secondary | ICD-10-CM

## 2020-05-26 NOTE — Progress Notes (Signed)
Subjective: Adrian Matthews is a 74 y.o. male patient seen today at risk foot care. Pt has h/o NIDDM with chronic kidney disease and painful mycotic nails b/l that are difficult to trim. Pain interferes with ambulation. Aggravating factors include wearing enclosed shoe gear. Pain is relieved with periodic professional debridement.  He voices no new pedal problems on today's visit.  He continues to take his Allopurinol daily to prevent gout attack.   He has started going back to the [facility], but states he may stop again due to COVID resurgence.  Patient Active Problem List   Diagnosis Date Noted  . Type 2 diabetes mellitus with diabetic neuropathy, with long-term current use of insulin ([place]) 01/06/2019  . Iron deficiency anemia 08/30/2018  . Generalized osteoarthritis of multiple sites 08/30/2018  . Chronic kidney disease, stage 3 12/17/2017  . Testosterone deficiency in male 08/10/2017  . Pituitary microadenoma with hyperprolactinemia ([place]) 01/02/2017  . Hormone deficiency 01/02/2017  . Hyperprolactinemia ([place]) 11/19/2016  . At risk for obstructive sleep apnea 09/25/2016  . 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  . Morning headache 02/11/2016  . Arthritis 10/27/2014  . Class 3 severe obesity with serious comorbidity and body mass index (BMI) of 50.0 to 59.9 in adult ([place]) 06/04/2013  . Rectal bleeding 06/04/2013  . Osteoarthrosis, unspecified whether generalized or localized, lower leg   . Monoclonal paraproteinemia   . Gout, unspecified   . Type 2 diabetes mellitus with stage 3 chronic kidney disease, with long-term current use of insulin ([place]) 02/28/2013  . Hyperlipidemia associated with type 2 diabetes mellitus ([place]) 02/28/2013  . Essential hypertension 02/28/2013  . Unspecified disorder of kidney and ureter 02/28/2013    Current Outpatient Medications on File Prior to Visit  Medication Sig Dispense Refill  . allopurinol (ZYLOPRIM) 100 MG tablet TAKE 1 TABLET DAILY 90 tablet 3  .  amLODipine (NORVASC) 10 MG tablet TAKE 1 TABLET DAILY FOR BLOOD PRESSURE 90 tablet 3  . aspirin EC 81 MG tablet Take 1 tablet (81 mg total) by mouth daily. 30 tablet 11  . atorvastatin (LIPITOR) 40 MG tablet TAKE 1 TABLET DAILY 90 tablet 1  . chlorthalidone (HYGROTON) 25 MG tablet TAKE 1 TABLET DAILY FOR BLOOD PRESSURE 90 tablet 3  . Cholecalciferol (VITAMIN D3) 2000 units TABS Take 2,000 Units by mouth daily.     Marland Kitchen FREESTYLE LITE test strip CHECK BLOOD SUGAR THREE TIMES A DAY 300 each 3  . gabapentin (NEURONTIN) 300 MG capsule Take 1 capsule (300 mg total) by mouth 3 (three) times daily. 270 capsule 1  . Insulin Glargine (LANTUS SOLOSTAR) 100 UNIT/ML Solostar Pen INJECT 66 UNITS UNDER THE SKIN DAILY 60 mL 3  . insulin lispro (HUMALOG KWIKPEN) 100 UNIT/ML KwikPen Inject 22 units before breakfast, 26 units before lunch & supper-plus sliding scale 25 pen 3  . Insulin Pen Needle (SURE COMFORT PEN NEEDLES) 32G X 4 MM MISC Use 4 times daily before meals DX: E11.65 500 each 1  . irbesartan (AVAPRO) 300 MG tablet Take 1 tablet (300 mg total) by mouth daily. 90 tablet 3  . Omega-3 Fatty Acids (FISH OIL) 1200 MG CAPS Take 1,200 mg by mouth daily.     . pantoprazole (PROTONIX) 40 MG tablet TAKE 1 TABLET DAILY 90 tablet 3  . potassium chloride SA (KLOR-CON) 20 MEQ tablet TAKE 1 TABLET TWICE A DAY 180 tablet 3   No current facility-administered medications on file prior to visit.    No Known Allergies  Objective: Physical Exam  General:  [name] is a pleasant 74 y.o. y.o. AA male, morbidly obese in NAD. AAO x 3.   Vascular:  Neurovascular status unchanged b/l lower extremities. Capillary refill time to digits immediate b/l. Palpable pedal pulses b/l LE. Pedal hair present. Lower extremity skin temperature gradient within normal limits. No edema noted b/l lower extremities.  Dermatological:  Pedal skin with normal turgor, texture and tone bilaterally. No open wounds bilaterally. No  interdigital macerations bilaterally. Toenails 1-5 b/l elongated, dystrophic, thickened, crumbly with subungual debris and tenderness to dorsal palpation.  Musculoskeletal:  Normal muscle strength 5/5 to all lower extremity muscle groups bilaterally. No gross bony deformities bilaterally. No pain crepitus or joint limitation noted with ROM b/l. Patient ambulates independent of any assistive aids.  Neurological:  Protective sensation intact 5/5 intact bilaterally with 10g monofilament b/l. Vibratory sensation intact b/l. Proprioception intact bilaterally.  Assessment and Plan:  1. Pain due to onychomycosis of toenails of both feet   2. Diabetic peripheral neuropathy associated with type 2 diabetes mellitus ([place])   3. Pes planus of both feet    -Examined patient. -No new findings. No new orders. -Continue diabetic foot care principles. Literature dispensed on today.  -Toenails 1-5 b/l were debrided in length and girth with sterile nail nippers and dremel without iatrogenic bleeding.  -Patient to continue soft, supportive shoe gear daily. -Patient to report any pedal injuries to medical professional immediately. -Patient/POA to call should there be question/concern in the interim.  Return in about 3 months (around 08/25/2020) for diabetic nail trim.  Marzetta Board, DPM

## 2020-05-29 ENCOUNTER — Other Ambulatory Visit: Payer: Medicare Other

## 2020-05-29 ENCOUNTER — Other Ambulatory Visit: Payer: Self-pay

## 2020-05-29 DIAGNOSIS — Z794 Long term (current) use of insulin: Secondary | ICD-10-CM

## 2020-05-29 DIAGNOSIS — E162 Hypoglycemia, unspecified: Secondary | ICD-10-CM

## 2020-05-29 DIAGNOSIS — E1140 Type 2 diabetes mellitus with diabetic neuropathy, unspecified: Secondary | ICD-10-CM | POA: Diagnosis not present

## 2020-05-29 DIAGNOSIS — E785 Hyperlipidemia, unspecified: Secondary | ICD-10-CM

## 2020-05-29 DIAGNOSIS — E1169 Type 2 diabetes mellitus with other specified complication: Secondary | ICD-10-CM

## 2020-05-30 LAB — CBC WITH DIFFERENTIAL/PLATELET
Absolute Monocytes: 339 cells/uL (ref 200–950)
Basophils Absolute: 35 cells/uL (ref 0–200)
Basophils Relative: 0.3 %
Eosinophils Absolute: 164 cells/uL (ref 15–500)
Eosinophils Relative: 1.4 %
HCT: 38.5 % (ref 38.5–50.0)
Hemoglobin: 12 g/dL — ABNORMAL LOW (ref 13.2–17.1)
Lymphs Abs: 5639 cells/uL — ABNORMAL HIGH (ref 850–3900)
MCH: 24.6 pg — ABNORMAL LOW (ref 27.0–33.0)
MCHC: 31.2 g/dL — ABNORMAL LOW (ref 32.0–36.0)
MCV: 79.1 fL — ABNORMAL LOW (ref 80.0–100.0)
MPV: 10.5 fL (ref 7.5–12.5)
Monocytes Relative: 2.9 %
Neutro Abs: 5522 cells/uL (ref 1500–7800)
Neutrophils Relative %: 47.2 %
Platelets: 262 10*3/uL (ref 140–400)
RBC: 4.87 10*6/uL (ref 4.20–5.80)
RDW: 16.4 % — ABNORMAL HIGH (ref 11.0–15.0)
Total Lymphocyte: 48.2 %
WBC: 11.7 10*3/uL — ABNORMAL HIGH (ref 3.8–10.8)

## 2020-05-30 LAB — HEMOGLOBIN A1C
Hgb A1c MFr Bld: 7.5 % of total Hgb — ABNORMAL HIGH (ref <5.7)
Mean Plasma Glucose: 169 (calc)
eAG (mmol/L): 9.3 (calc)

## 2020-05-30 LAB — COMPLETE METABOLIC PANEL WITH GFR
AG Ratio: 1.2 (calc) (ref 1.0–2.5)
ALT: 13 U/L (ref 9–46)
AST: 14 U/L (ref 10–35)
Albumin: 3.9 g/dL (ref 3.6–5.1)
Alkaline phosphatase (APISO): 71 U/L (ref 35–144)
BUN/Creatinine Ratio: 16 (calc) (ref 6–22)
BUN: 23 mg/dL (ref 7–25)
CO2: 28 mmol/L (ref 20–32)
Calcium: 10.1 mg/dL (ref 8.6–10.3)
Chloride: 99 mmol/L (ref 98–110)
Creat: 1.42 mg/dL — ABNORMAL HIGH (ref 0.70–1.18)
GFR, Est African American: 56 mL/min/{1.73_m2} — ABNORMAL LOW (ref >=60)
GFR, Est Non African American: 49 mL/min/{1.73_m2} — ABNORMAL LOW (ref >=60)
Globulin: 3.3 g/dL (calc) (ref 1.9–3.7)
Glucose, Bld: 101 mg/dL — ABNORMAL HIGH (ref 65–99)
Potassium: 3.4 mmol/L — ABNORMAL LOW (ref 3.5–5.3)
Sodium: 139 mmol/L (ref 135–146)
Total Bilirubin: 0.3 mg/dL (ref 0.2–1.2)
Total Protein: 7.2 g/dL (ref 6.1–8.1)

## 2020-05-30 LAB — LIPID PANEL
Cholesterol: 152 mg/dL (ref <200)
HDL: 49 mg/dL (ref >=40)
LDL Cholesterol (Calc): 86 mg/dL (calc)
Non-HDL Cholesterol (Calc): 103 mg/dL (calc) (ref <130)
Total CHOL/HDL Ratio: 3.1 (calc) (ref <5.0)
Triglycerides: 76 mg/dL (ref <150)

## 2020-05-30 NOTE — Progress Notes (Signed)
Labs are overall ok.  We can review in detail at his visit.  Sugar is a bit better :)  potassium was slightly low.  He should increase potassium in his diet just a bit for a day or two to fix this (banana, sweet potato, tomatoes) but this is not something to do long-term with his kidney function.

## 2020-06-07 ENCOUNTER — Encounter: Payer: Self-pay | Admitting: Internal Medicine

## 2020-06-07 ENCOUNTER — Ambulatory Visit (INDEPENDENT_AMBULATORY_CARE_PROVIDER_SITE_OTHER): Payer: Medicare Other | Admitting: Internal Medicine

## 2020-06-07 ENCOUNTER — Other Ambulatory Visit: Payer: Self-pay

## 2020-06-07 VITALS — BP 140/80 | HR 90 | Temp 97.2°F | Resp 22 | Ht 69.0 in | Wt 397.2 lb

## 2020-06-07 DIAGNOSIS — E785 Hyperlipidemia, unspecified: Secondary | ICD-10-CM | POA: Diagnosis not present

## 2020-06-07 DIAGNOSIS — E1169 Type 2 diabetes mellitus with other specified complication: Secondary | ICD-10-CM

## 2020-06-07 DIAGNOSIS — Z794 Long term (current) use of insulin: Secondary | ICD-10-CM | POA: Diagnosis not present

## 2020-06-07 DIAGNOSIS — E893 Postprocedural hypopituitarism: Secondary | ICD-10-CM | POA: Diagnosis not present

## 2020-06-07 DIAGNOSIS — I739 Peripheral vascular disease, unspecified: Secondary | ICD-10-CM | POA: Insufficient documentation

## 2020-06-07 DIAGNOSIS — I10 Essential (primary) hypertension: Secondary | ICD-10-CM | POA: Diagnosis not present

## 2020-06-07 DIAGNOSIS — E1140 Type 2 diabetes mellitus with diabetic neuropathy, unspecified: Secondary | ICD-10-CM

## 2020-06-07 NOTE — Progress Notes (Signed)
Location:  [facility] clinic Provider:  Cerise Lieber L. Mariea Clonts, D.O., C.M.D.  Goals of Care:  Advanced Directives 06/07/2020  Does Patient Have a Medical Advance Directive? No  Type of Advance Directive -  Does patient want to make changes to medical advance directive? -  Copy of [place] in Chart? -  Would patient like information on creating a medical advance directive? -  Pre-existing out of facility DNR order (yellow form or pink MOST form) -   Chief Complaint  Patient presents with  . Medical Management of Chronic Issues    4 Month Follow Up    HPI: Patient is a 74 y.o. male seen today for medical management of chronic diseases.    BP is 140/80.  Has his pills split afternoon and morning.  BP running 140-148 at home.  He started back at the gym, but he's not too sure how long it will last.  Wearing a mask again there.  Going 3-4 times a week.  Does a water aerobics class, swimming and gets on the glide and treadmill every now and then.    Weight keeps on going up.  Eats baked, broiled and grilled meats and veggies.  Eats twice a day.  Not eating bigger volumes.  Sees Dr. Forde Dandy next week.    Tried mio and didn't really like it so drinking regular water.  hba1c 7.5.  His K was a little low.  Does not eat his bananas and they rot on him.    Admits he likes bacon.  LDL is in 80s with goal less than 70.    He used to see Dr. Clotilde Dieter.  He's been coughing a little bit.  Not really feeling like he has a cold.  Not productive.  Bothered by perfume, even smell of coffee makes him cough.    No dysuria.  No diarrhea.   Past Medical History:  Diagnosis Date  . Asymptomatic varicose veins   . Chronic kidney disease   . Diaphragmatic hernia without mention of obstruction or gangrene   . DM (diabetes mellitus) ([place])   . GERD (gastroesophageal reflux disease)   . Gout, unspecified   . Hypercalcemia   . Hyperlipidemia   . Hyperosmolality and/or hypernatremia   . Hypertension    . Monoclonal paraproteinemia   . New daily persistent headache   . Osteoarthrosis, unspecified whether generalized or localized, lower leg   . Other malaise and fatigue   . Primary localized osteoarthrosis, unspecified site   . Unspecified disorder of kidney and ureter   . Unspecified hereditary and idiopathic peripheral neuropathy     Past Surgical History:  Procedure Laterality Date  . BIOPSY  10/19/2018   Procedure: BIOPSY;  Surgeon: Ronald Lobo, MD;  Location: [facility] ENDOSCOPY;  Service: Endoscopy;;  . CARPAL TUNNEL RELEASE  2007  . COLONOSCOPY N/A 06/06/2013   Procedure: COLONOSCOPY;  Surgeon: Cleotis Nipper, MD;  Location: [facility] ENDOSCOPY;  Service: Endoscopy;  Laterality: N/A;  . COLONOSCOPY WITH PROPOFOL N/A 10/19/2018   Procedure: COLONOSCOPY WITH PROPOFOL;  Surgeon: Ronald Lobo, MD;  Location: [facility] ENDOSCOPY;  Service: Endoscopy;  Laterality: N/A;  . POLYPECTOMY  10/19/2018   Procedure: POLYPECTOMY;  Surgeon: Ronald Lobo, MD;  Location: [facility] ENDOSCOPY;  Service: Endoscopy;;  . TUMOR REMOVAL  2017   Pituitary adenoma    No Known Allergies  Outpatient Encounter Medications as of 06/07/2020  Medication Sig  . allopurinol (ZYLOPRIM) 100 MG tablet TAKE 1 TABLET DAILY  . amLODipine (NORVASC)  10 MG tablet TAKE 1 TABLET DAILY FOR BLOOD PRESSURE  . aspirin EC 81 MG tablet Take 1 tablet (81 mg total) by mouth daily.  Marland Kitchen atorvastatin (LIPITOR) 40 MG tablet TAKE 1 TABLET DAILY  . chlorthalidone (HYGROTON) 25 MG tablet TAKE 1 TABLET DAILY FOR BLOOD PRESSURE  . Cholecalciferol (VITAMIN D3) 2000 units TABS Take 2,000 Units by mouth daily.   Marland Kitchen FREESTYLE LITE test strip CHECK BLOOD SUGAR THREE TIMES A DAY  . gabapentin (NEURONTIN) 300 MG capsule Take 300 mg by mouth daily.  . Insulin Glargine (LANTUS SOLOSTAR) 100 UNIT/ML Solostar Pen INJECT 66 UNITS UNDER THE SKIN DAILY  . insulin lispro (HUMALOG KWIKPEN) 100 UNIT/ML KwikPen Inject 22 units before breakfast, 26 units before lunch &  supper-plus sliding scale  . Insulin Pen Needle (SURE COMFORT PEN NEEDLES) 32G X 4 MM MISC Use 4 times daily before meals DX: E11.65  . irbesartan (AVAPRO) 300 MG tablet Take 1 tablet (300 mg total) by mouth daily.  . Omega-3 Fatty Acids (FISH OIL) 1200 MG CAPS Take 1,200 mg by mouth daily.   . pantoprazole (PROTONIX) 40 MG tablet TAKE 1 TABLET DAILY  . potassium chloride SA (KLOR-CON) 20 MEQ tablet TAKE 1 TABLET TWICE A DAY  . [DISCONTINUED] gabapentin (NEURONTIN) 300 MG capsule Take 1 capsule (300 mg total) by mouth 3 (three) times daily. (Patient taking differently: Take 300 mg by mouth daily. )   No facility-administered encounter medications on file as of 06/07/2020.    Review of Systems:  Review of Systems  Constitutional: Negative for chills, fever and malaise/fatigue.  HENT: Negative for hearing loss.   Eyes: Negative for blurred vision.       Seeing well, no diplopia   Respiratory: Negative for cough and shortness of breath.   Cardiovascular: Negative for chest pain, palpitations and leg swelling.  Gastrointestinal: Negative for abdominal pain, blood in stool, constipation, diarrhea and melena.  Genitourinary: Negative for dysuria, frequency and urgency.  Musculoskeletal: Negative for falls.  Skin: Negative for itching and rash.  Neurological: Positive for tingling and sensory change. Negative for dizziness and loss of consciousness.  Psychiatric/Behavioral: Negative for depression and memory loss. The patient is not nervous/anxious and does not have insomnia.     Health Maintenance  Topic Date Due  . INFLUENZA VACCINE  05/13/2020  . OPHTHALMOLOGY EXAM  07/17/2020  . FOOT EXAM  08/29/2020  . HEMOGLOBIN A1C  11/29/2020  . TETANUS/TDAP  04/13/2023  . COLONOSCOPY  10/19/2028  . COVID-19 Vaccine  Completed  . Hepatitis C Screening  Completed  . PNA vac Low Risk Adult  Completed    Physical Exam: Vitals:   06/07/20 0807  BP: 140/80  Pulse: 90  Resp: (!) 22  Temp: (!)  97.2 F (36.2 C)  TempSrc: Oral  SpO2: 95%  Weight: (!) 397 lb 3.2 oz (180.2 kg)  Height: 5\' 9"  (1.753 m)   Body mass index is 58.66 kg/m. Physical Exam Vitals reviewed.  Constitutional:      General: He is not in acute distress.    Appearance: Normal appearance. He is obese. He is not ill-appearing or toxic-appearing.  HENT:     Head: Normocephalic and atraumatic.  Cardiovascular:     Rate and Rhythm: Normal rate and regular rhythm.     Pulses: Normal pulses.     Heart sounds: Normal heart sounds.  Pulmonary:     Effort: Pulmonary effort is normal.     Breath sounds: Normal breath sounds. No wheezing,  rhonchi or rales.  Abdominal:     General: Bowel sounds are normal.  Musculoskeletal:        General: Normal range of motion.     Cervical back: Neck supple.     Right lower leg: No edema.     Left lower leg: No edema.  Neurological:     General: No focal deficit present.     Mental Status: He is alert and oriented to person, place, and time.     Motor: No weakness.     Gait: Gait normal.  Psychiatric:        Mood and Affect: Mood normal.        Behavior: Behavior normal.     Labs reviewed: Basic Metabolic Panel: Recent Labs    12/21/19 0805 01/12/20 0816 05/29/20 1009  NA 140 139 139  K 3.8 3.5 3.4*  CL 99 97* 99  CO2 29 30 28   GLUCOSE 131* 158* 101*  BUN 22 18 23   CREATININE 1.38* 1.52* 1.42*  CALCIUM 10.0 10.1 10.1   Liver Function Tests: Recent Labs    01/12/20 0816 05/29/20 1009  AST 16 14  ALT 15 13  BILITOT 0.3 0.3  PROT 7.3 7.2   No results for input(s): LIPASE, AMYLASE in the last 8760 hours. No results for input(s): AMMONIA in the last 8760 hours. CBC: Recent Labs    09/29/19 0925 01/12/20 0816 05/29/20 1009  WBC 5.3 10.4 11.7*  NEUTROABS 1,675 5,158 5,522  HGB 12.0* 12.6* 12.0*  HCT 37.1* 40.7 38.5  MCV 83.0 80.6 79.1*  PLT 223 280 262   Lipid Panel: Recent Labs    09/12/19 0828 05/29/20 1009  CHOL 149 152  HDL 43 49    LDLCALC 88 86  TRIG 89 76  CHOLHDL 3.5 3.1   Lab Results  Component Value Date   HGBA1C 7.5 (H) 05/29/2020    Assessment/Plan 1. Claudication of both lower extremities ([place]) -no active complaints about this -is going to the gym with covid precautions  2. Type 2 diabetes mellitus with diabetic neuropathy, with long-term current use of insulin ([facility]) -hba1c up to 7.5 not bad for age, but considering his functional abilities, he could do better -weight keeps on going up ever since his microadenoma was resected from his pituitary--has f/u with Dr. Forde Dandy next month - Amb Ref to [facility]--he agrees to this today and I'm hoping that the team there can assist with helping him to lose weight--he's reached a point where he will soon not fit in the regular chairs - Hemoglobin A1c; Future  3. Hyperlipidemia associated with type 2 diabetes mellitus (HCC) - slightly elevated, he thinks he can get this back down with less fried, bacon - Amb Ref to [facility] - Lipid panel; Future - BASIC METABOLIC PANEL WITH GFR; Future - CBC with Differential/Platelet; Future  4. Obesity, morbid (more than 100 lbs over ideal weight or BMI > 40) (HCC) - has not changed his eating habit recently and back at gym, but he's continuing to gain weight though he reports eating primarily meats and veggies - Amb Ref to Medical Weight Management  5. Essential hypertension - bp at upper end of normal today, cont same regimen for now - Amb Ref to [facility]  6. 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 Forde Dandy and will send to  [facility] also - Amb Ref to [facility]  Labs/tests ordered:   Lab Orders     Hemoglobin A1c     Lipid panel     BASIC METABOLIC PANEL WITH GFR     CBC with Differential/Platelet  Next  appt:  10/08/2020  Bryton Waight L. Janeliz Prestwood, D.O. [facility] [address], [place] [address] Cell Phone (Mon-Fri 8am-5pm):  [phone] On Call:  [phone] & follow prompts after 5pm & weekends Office Phone:  [phone] Office Fax:  [phone]

## 2020-06-22 DIAGNOSIS — N2581 Secondary hyperparathyroidism of renal origin: Secondary | ICD-10-CM | POA: Diagnosis not present

## 2020-06-22 DIAGNOSIS — E221 Hyperprolactinemia: Secondary | ICD-10-CM | POA: Diagnosis not present

## 2020-06-22 DIAGNOSIS — Z794 Long term (current) use of insulin: Secondary | ICD-10-CM | POA: Diagnosis not present

## 2020-06-22 DIAGNOSIS — D126 Benign neoplasm of colon, unspecified: Secondary | ICD-10-CM | POA: Diagnosis not present

## 2020-06-22 DIAGNOSIS — D352 Benign neoplasm of pituitary gland: Secondary | ICD-10-CM | POA: Diagnosis not present

## 2020-06-22 DIAGNOSIS — E291 Testicular hypofunction: Secondary | ICD-10-CM | POA: Diagnosis not present

## 2020-06-22 DIAGNOSIS — E1129 Type 2 diabetes mellitus with other diabetic kidney complication: Secondary | ICD-10-CM | POA: Diagnosis not present

## 2020-06-22 DIAGNOSIS — E785 Hyperlipidemia, unspecified: Secondary | ICD-10-CM | POA: Diagnosis not present

## 2020-06-22 DIAGNOSIS — I10 Essential (primary) hypertension: Secondary | ICD-10-CM | POA: Diagnosis not present

## 2020-06-22 DIAGNOSIS — N1831 Chronic kidney disease, stage 3a: Secondary | ICD-10-CM | POA: Diagnosis not present

## 2020-07-10 ENCOUNTER — Other Ambulatory Visit: Payer: Self-pay | Admitting: Internal Medicine

## 2020-07-10 ENCOUNTER — Telehealth: Payer: Self-pay

## 2020-07-10 MED ORDER — GABAPENTIN 300 MG PO CAPS: 300.0000 mg | ORAL_CAPSULE | Freq: Every day | ORAL | 1 refills | Status: DC

## 2020-07-10 NOTE — Telephone Encounter (Signed)
Patient called to let Dr.Reed know that the weight program at [facility] requires that patient pays a fee prior to participation and for that reason he has opted out of weight program   FYI

## 2020-07-10 NOTE — Telephone Encounter (Signed)
Called patient to clarify request. Patient confirmed that he is taking gabapentin 300 mg once daily versus three times daily as requested from pharmacy.

## 2020-07-10 NOTE — Telephone Encounter (Signed)
Im sorry to hear that.  °

## 2020-07-11 ENCOUNTER — Other Ambulatory Visit: Payer: Self-pay

## 2020-07-11 ENCOUNTER — Ambulatory Visit (INDEPENDENT_AMBULATORY_CARE_PROVIDER_SITE_OTHER): Payer: Medicare Other | Admitting: *Deleted

## 2020-07-11 DIAGNOSIS — Z23 Encounter for immunization: Secondary | ICD-10-CM

## 2020-07-12 ENCOUNTER — Other Ambulatory Visit: Payer: Self-pay | Admitting: Internal Medicine

## 2020-07-12 DIAGNOSIS — E1122 Type 2 diabetes mellitus with diabetic chronic kidney disease: Secondary | ICD-10-CM

## 2020-07-18 DIAGNOSIS — E119 Type 2 diabetes mellitus without complications: Secondary | ICD-10-CM | POA: Diagnosis not present

## 2020-07-18 DIAGNOSIS — H52203 Unspecified astigmatism, bilateral: Secondary | ICD-10-CM | POA: Diagnosis not present

## 2020-07-18 DIAGNOSIS — H5213 Myopia, bilateral: Secondary | ICD-10-CM | POA: Diagnosis not present

## 2020-07-18 DIAGNOSIS — H25813 Combined forms of age-related cataract, bilateral: Secondary | ICD-10-CM | POA: Diagnosis not present

## 2020-07-18 LAB — HM DIABETES EYE EXAM

## 2020-07-28 ENCOUNTER — Ambulatory Visit: Payer: Medicare Other | Attending: Internal Medicine

## 2020-07-28 DIAGNOSIS — Z23 Encounter for immunization: Secondary | ICD-10-CM

## 2020-07-28 NOTE — Progress Notes (Signed)
   Covid-19 Vaccination Clinic  Name:  Adrian Matthews    MRN: [id number] DOB: Jun 13, 1946  07/28/2020  Adrian Matthews was observed post Covid-19 immunization for 15 minutes without incident. He was provided with Vaccine Information Sheet and instruction to access the V-Safe system.   Adrian Matthews was instructed to call 911 with any severe reactions post vaccine: Marland Kitchen Difficulty breathing  . Swelling of face and throat  . A fast heartbeat  . A bad rash all over body  . Dizziness and weakness

## 2020-08-01 DIAGNOSIS — N183 Chronic kidney disease, stage 3 unspecified: Secondary | ICD-10-CM | POA: Diagnosis not present

## 2020-08-07 DIAGNOSIS — N2581 Secondary hyperparathyroidism of renal origin: Secondary | ICD-10-CM | POA: Diagnosis not present

## 2020-08-07 DIAGNOSIS — N182 Chronic kidney disease, stage 2 (mild): Secondary | ICD-10-CM | POA: Diagnosis not present

## 2020-08-07 DIAGNOSIS — D631 Anemia in chronic kidney disease: Secondary | ICD-10-CM | POA: Diagnosis not present

## 2020-08-07 DIAGNOSIS — I129 Hypertensive chronic kidney disease with stage 1 through stage 4 chronic kidney disease, or unspecified chronic kidney disease: Secondary | ICD-10-CM | POA: Diagnosis not present

## 2020-08-27 ENCOUNTER — Ambulatory Visit (INDEPENDENT_AMBULATORY_CARE_PROVIDER_SITE_OTHER): Payer: Medicare Other | Admitting: Podiatry

## 2020-08-27 ENCOUNTER — Encounter: Payer: Self-pay | Admitting: Podiatry

## 2020-08-27 ENCOUNTER — Other Ambulatory Visit: Payer: Self-pay

## 2020-08-27 DIAGNOSIS — M79674 Pain in right toe(s): Secondary | ICD-10-CM | POA: Diagnosis not present

## 2020-08-27 DIAGNOSIS — M79675 Pain in left toe(s): Secondary | ICD-10-CM

## 2020-08-27 DIAGNOSIS — E1142 Type 2 diabetes mellitus with diabetic polyneuropathy: Secondary | ICD-10-CM

## 2020-08-27 DIAGNOSIS — M2141 Flat foot [pes planus] (acquired), right foot: Secondary | ICD-10-CM

## 2020-08-27 DIAGNOSIS — B351 Tinea unguium: Secondary | ICD-10-CM

## 2020-08-27 DIAGNOSIS — M2142 Flat foot [pes planus] (acquired), left foot: Secondary | ICD-10-CM

## 2020-08-27 NOTE — Progress Notes (Signed)
Subjective: Adrian Matthews is a 74 y.o. male patient seen today at risk foot care. Pt has h/o NIDDM with chronic kidney disease and painful mycotic nails b/l that are difficult to trim. Pain interferes with ambulation. Aggravating factors include wearing enclosed shoe gear. Pain is relieved with periodic professional debridement.   He did not check his blood glucose this morning. States it was 108 mg/dl yesterday morning.    He states he has been eating better and still going to the [facility] to swim.  He would like an appointment sooner than 3 months as he feels his toenails grow really fast. He voices no other pedal problems on today's visit.   Patient Active Problem List   Diagnosis Date Noted  . Claudication of both lower extremities ([place]) 06/07/2020  . Type 2 diabetes mellitus with diabetic neuropathy, with long-term current use of insulin ([place]) 01/06/2019  . Iron deficiency anemia 08/30/2018  . Generalized osteoarthritis of multiple sites 08/30/2018  . Chronic kidney disease, stage 3 ([place]) 12/17/2017  . Testosterone deficiency in male 08/10/2017  . Pituitary microadenoma with hyperprolactinemia ([place]) 01/02/2017  . Hormone deficiency 01/02/2017  . At risk for obstructive sleep apnea 09/25/2016  . 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  . Morning headache 02/11/2016  . Arthritis 10/27/2014  . Class 3 severe obesity with serious comorbidity and body mass index (BMI) of 50.0 to 59.9 in adult ([place]) 06/04/2013  . Rectal bleeding 06/04/2013  . Osteoarthrosis, unspecified whether generalized or localized, lower leg   . Monoclonal paraproteinemia   . Gout, unspecified   . Type 2 diabetes mellitus with stage 3 chronic kidney disease, with long-term current use of insulin ([place]) 02/28/2013  . Hyperlipidemia associated with type 2 diabetes mellitus ([place]) 02/28/2013  . Essential hypertension 02/28/2013  . Unspecified disorder of kidney and ureter 02/28/2013    Current Outpatient Medications on  File Prior to Visit  Medication Sig Dispense Refill  . allopurinol (ZYLOPRIM) 100 MG tablet TAKE 1 TABLET DAILY 90 tablet 3  . amLODipine (NORVASC) 10 MG tablet TAKE 1 TABLET DAILY FOR BLOOD PRESSURE 90 tablet 3  . aspirin EC 81 MG tablet Take 1 tablet (81 mg total) by mouth daily. 30 tablet 11  . atorvastatin (LIPITOR) 40 MG tablet TAKE 1 TABLET DAILY 90 tablet 1  . chlorthalidone (HYGROTON) 25 MG tablet TAKE 1 TABLET DAILY FOR BLOOD PRESSURE 90 tablet 3  . Cholecalciferol (VITAMIN D3) 2000 units TABS Take 2,000 Units by mouth daily.     Marland Kitchen FREESTYLE LITE test strip CHECK BLOOD SUGAR THREE TIMES A DAY 300 each 3  . gabapentin (NEURONTIN) 300 MG capsule Take 1 capsule (300 mg total) by mouth daily. 90 capsule 1  . Insulin Glargine (LANTUS SOLOSTAR) 100 UNIT/ML Solostar Pen INJECT 66 UNITS UNDER THE SKIN DAILY 60 mL 3  . insulin lispro (HUMALOG KWIKPEN) 100 UNIT/ML KwikPen INJECT 22 UNITS BEFORE BREAKFAST, 26 UNITS BEFORE LUNCH, 26 UNITS BEFORE SUPPER PLUS SLIDING SCALE. 75 mL 3  . Insulin Pen Needle (SURE COMFORT PEN NEEDLES) 32G X 4 MM MISC Use 4 times daily before meals DX: E11.65 500 each 1  . irbesartan (AVAPRO) 300 MG tablet Take 1 tablet (300 mg total) by mouth daily. 90 tablet 3  . Omega-3 Fatty Acids (FISH OIL) 1200 MG CAPS Take 1,200 mg by mouth daily.     . pantoprazole (PROTONIX) 40 MG tablet TAKE 1 TABLET DAILY 90 tablet 3  . potassium chloride SA (KLOR-CON) 20 MEQ tablet TAKE 1 TABLET TWICE A  DAY 180 tablet 3   No current facility-administered medications on file prior to visit.    No Known Allergies  Objective: Physical Exam  General: [name] is a pleasant 74 y.o. y.o. AA male, morbidly obese in NAD. AAO x 3.   Vascular:  Neurovascular status unchanged b/l lower extremities. Capillary refill time to digits immediate b/l. Palpable pedal pulses b/l LE. Pedal hair present. Lower extremity skin temperature gradient within normal limits. No edema noted b/l lower  extremities.  Dermatological:  Pedal skin with normal turgor, texture and tone bilaterally. No open wounds bilaterally. No interdigital macerations bilaterally. Toenails 1-5 b/l elongated, dystrophic, thickened, crumbly with subungual debris and tenderness to dorsal palpation.  Musculoskeletal:  Normal muscle strength 5/5 to all lower extremity muscle groups bilaterally. No gross bony deformities bilaterally. No pain crepitus or joint limitation noted with ROM b/l. Patient ambulates independent of any assistive aids.  Neurological:  Protective sensation intact 5/5 intact bilaterally with 10g monofilament b/l. Vibratory sensation intact b/l. Proprioception intact bilaterally.  Assessment and Plan:  1. Pain due to onychomycosis of toenails of both feet   2. Pes planus of both feet   3. Diabetic peripheral neuropathy associated with type 2 diabetes mellitus ([place])    -Examined patient. -No new findings. No new orders. -Continue diabetic foot care principles. Literature dispensed on today.  -Toenails 1-5 b/l were debrided in length and girth with sterile nail nippers and dremel without iatrogenic bleeding.  -Patient to continue soft, supportive shoe gear daily. -Patient to report any pedal injuries to medical professional immediately. -Patient/POA to call should there be question/concern in the interim.  Return in about 9 weeks (around 10/29/2020) or soonest appointment available.  Marzetta Board, DPM

## 2020-09-10 ENCOUNTER — Other Ambulatory Visit: Payer: Self-pay | Admitting: Internal Medicine

## 2020-09-10 DIAGNOSIS — I10 Essential (primary) hypertension: Secondary | ICD-10-CM

## 2020-09-19 ENCOUNTER — Other Ambulatory Visit: Payer: Self-pay | Admitting: Internal Medicine

## 2020-09-19 DIAGNOSIS — M1A3790 Chronic gout due to renal impairment, unspecified ankle and foot, without tophus (tophi): Secondary | ICD-10-CM

## 2020-09-19 NOTE — Telephone Encounter (Signed)
Good catch.  Yes, please add the uric acid to his labs.  Thanks.

## 2020-09-19 NOTE — Telephone Encounter (Signed)
Last uric acid value was 2019, 8.9 Ref range 4.0-8.0  I will send to [place] Patent, DO to ask if order for uric acid to be added for pending lab appointment on 10/03/2020

## 2020-09-25 ENCOUNTER — Other Ambulatory Visit: Payer: Self-pay | Admitting: Internal Medicine

## 2020-09-25 DIAGNOSIS — K219 Gastro-esophageal reflux disease without esophagitis: Secondary | ICD-10-CM

## 2020-10-03 ENCOUNTER — Other Ambulatory Visit: Payer: Medicare Other

## 2020-10-04 ENCOUNTER — Other Ambulatory Visit: Payer: Self-pay | Admitting: Internal Medicine

## 2020-10-04 DIAGNOSIS — E0822 Diabetes mellitus due to underlying condition with diabetic chronic kidney disease: Secondary | ICD-10-CM

## 2020-10-08 ENCOUNTER — Ambulatory Visit: Payer: Medicare Other | Admitting: Internal Medicine

## 2020-10-15 ENCOUNTER — Other Ambulatory Visit: Payer: Medicare Other | Admitting: Orthotics

## 2020-10-16 ENCOUNTER — Other Ambulatory Visit: Payer: Medicare Other

## 2020-10-16 ENCOUNTER — Other Ambulatory Visit: Payer: Self-pay

## 2020-10-16 DIAGNOSIS — M1A3790 Chronic gout due to renal impairment, unspecified ankle and foot, without tophus (tophi): Secondary | ICD-10-CM

## 2020-10-16 DIAGNOSIS — E785 Hyperlipidemia, unspecified: Secondary | ICD-10-CM

## 2020-10-16 DIAGNOSIS — E1169 Type 2 diabetes mellitus with other specified complication: Secondary | ICD-10-CM | POA: Diagnosis not present

## 2020-10-16 DIAGNOSIS — Z794 Long term (current) use of insulin: Secondary | ICD-10-CM

## 2020-10-16 DIAGNOSIS — E1140 Type 2 diabetes mellitus with diabetic neuropathy, unspecified: Secondary | ICD-10-CM | POA: Diagnosis not present

## 2020-10-17 LAB — CBC WITH DIFFERENTIAL/PLATELET
Absolute Monocytes: 418 cells/uL (ref 200–950)
Basophils Absolute: 23 cells/uL (ref 0–200)
Basophils Relative: 0.2 %
Eosinophils Absolute: 186 cells/uL (ref 15–500)
Eosinophils Relative: 1.6 %
HCT: 39.7 % (ref 38.5–50.0)
Hemoglobin: 12.6 g/dL — ABNORMAL LOW (ref 13.2–17.1)
Lymphs Abs: 5174 cells/uL — ABNORMAL HIGH (ref 850–3900)
MCH: 25.3 pg — ABNORMAL LOW (ref 27.0–33.0)
MCHC: 31.7 g/dL — ABNORMAL LOW (ref 32.0–36.0)
MCV: 79.7 fL — ABNORMAL LOW (ref 80.0–100.0)
MPV: 10.9 fL (ref 7.5–12.5)
Monocytes Relative: 3.6 %
Neutro Abs: 5800 cells/uL (ref 1500–7800)
Neutrophils Relative %: 50 %
Platelets: 270 10*3/uL (ref 140–400)
RBC: 4.98 10*6/uL (ref 4.20–5.80)
RDW: 16.6 % — ABNORMAL HIGH (ref 11.0–15.0)
Total Lymphocyte: 44.6 %
WBC: 11.6 10*3/uL — ABNORMAL HIGH (ref 3.8–10.8)

## 2020-10-17 LAB — BASIC METABOLIC PANEL WITH GFR
BUN/Creatinine Ratio: 15 (calc) (ref 6–22)
BUN: 23 mg/dL (ref 7–25)
CO2: 27 mmol/L (ref 20–32)
Calcium: 9.7 mg/dL (ref 8.6–10.3)
Chloride: 100 mmol/L (ref 98–110)
Creat: 1.54 mg/dL — ABNORMAL HIGH (ref 0.70–1.18)
GFR, Est African American: 51 mL/min/{1.73_m2} — ABNORMAL LOW (ref >=60)
GFR, Est Non African American: 44 mL/min/{1.73_m2} — ABNORMAL LOW (ref >=60)
Glucose, Bld: 149 mg/dL — ABNORMAL HIGH (ref 65–99)
Potassium: 3.8 mmol/L (ref 3.5–5.3)
Sodium: 137 mmol/L (ref 135–146)

## 2020-10-17 LAB — LIPID PANEL
Cholesterol: 144 mg/dL (ref <200)
HDL: 46 mg/dL (ref >=40)
LDL Cholesterol (Calc): 80 mg/dL (calc)
Non-HDL Cholesterol (Calc): 98 mg/dL (calc) (ref <130)
Total CHOL/HDL Ratio: 3.1 (calc) (ref <5.0)
Triglycerides: 93 mg/dL (ref <150)

## 2020-10-17 LAB — HEMOGLOBIN A1C
Hgb A1c MFr Bld: 7.7 % of total Hgb — ABNORMAL HIGH (ref <5.7)
Mean Plasma Glucose: 174 mg/dL
eAG (mmol/L): 9.7 mmol/L

## 2020-10-17 LAB — URIC ACID: Uric Acid, Serum: 8.9 mg/dL — ABNORMAL HIGH (ref 4.0–8.0)

## 2020-10-17 NOTE — Progress Notes (Signed)
All labs are essentially stable.  Uric acid (gout measure) is just as high as 2 yrs ago at 8.9 with goal less than 6 to prevent gout flares.  His chlorthalidone for blood pressure likely worsens this.  Has he been taking the allopurinol?  We can address all of this at his visit 1/10.

## 2020-10-22 ENCOUNTER — Other Ambulatory Visit: Payer: Self-pay

## 2020-10-22 ENCOUNTER — Encounter: Payer: Self-pay | Admitting: Internal Medicine

## 2020-10-22 ENCOUNTER — Ambulatory Visit (INDEPENDENT_AMBULATORY_CARE_PROVIDER_SITE_OTHER): Payer: Medicare Other | Admitting: Internal Medicine

## 2020-10-22 VITALS — BP 124/82 | HR 68 | Temp 97.1°F | Ht 69.0 in | Wt 392.8 lb

## 2020-10-22 DIAGNOSIS — E785 Hyperlipidemia, unspecified: Secondary | ICD-10-CM

## 2020-10-22 DIAGNOSIS — E1140 Type 2 diabetes mellitus with diabetic neuropathy, unspecified: Secondary | ICD-10-CM | POA: Diagnosis not present

## 2020-10-22 DIAGNOSIS — E893 Postprocedural hypopituitarism: Secondary | ICD-10-CM | POA: Diagnosis not present

## 2020-10-22 DIAGNOSIS — I739 Peripheral vascular disease, unspecified: Secondary | ICD-10-CM

## 2020-10-22 DIAGNOSIS — E790 Hyperuricemia without signs of inflammatory arthritis and tophaceous disease: Secondary | ICD-10-CM | POA: Diagnosis not present

## 2020-10-22 DIAGNOSIS — M1A3790 Chronic gout due to renal impairment, unspecified ankle and foot, without tophus (tophi): Secondary | ICD-10-CM

## 2020-10-22 DIAGNOSIS — E229 Hyperfunction of pituitary gland, unspecified: Secondary | ICD-10-CM | POA: Diagnosis not present

## 2020-10-22 DIAGNOSIS — Z794 Long term (current) use of insulin: Secondary | ICD-10-CM | POA: Diagnosis not present

## 2020-10-22 DIAGNOSIS — E1169 Type 2 diabetes mellitus with other specified complication: Secondary | ICD-10-CM

## 2020-10-22 DIAGNOSIS — I10 Essential (primary) hypertension: Secondary | ICD-10-CM | POA: Diagnosis not present

## 2020-10-22 DIAGNOSIS — D352 Benign neoplasm of pituitary gland: Secondary | ICD-10-CM | POA: Diagnosis not present

## 2020-10-22 NOTE — Progress Notes (Signed)
Location:  [facility] clinic Provider:  Marijayne Rauth L. Mariea Clonts, D.O., C.M.D.  Goals of Care:  Advanced Directives 10/22/2020  Does Patient Have a Medical Advance Directive? Yes  Type of Advance Directive Living will  Does patient want to make changes to medical advance directive? -  Copy of [place] in Chart? -  Would patient like information on creating a medical advance directive? -  Pre-existing out of facility DNR order (yellow form or pink MOST form) -  still has not brought [place] completed ACP documents  Chief Complaint  Patient presents with  . Medical Management of Chronic Issues    4 month follow up     HPI: Patient is a 75 y.o. male seen today for medical management of chronic diseases.    BP is looking better.    Weight is also down a few lbs.  He is doing water aerobics 45 mins and swimming for 30 mins.  He's going to use the elliptical machines at [facility] center and some of their classes.  He says he is starving himself eating butternut squash and brussel sprouts.  He baked a whole flounder for his lunch and dinner day.  Healthy food is also blowing his budget.    Over the holidays, he did eat the sweets and stuff in moderation but daily.  Lemon chess pie, double chocolate cake, banana cake that his aunt and family did.  He even put ice cream on some.  That's what did his hba1c.     He says it will come down next time.    He is taking the allopurinol daily but uric acid stays up.  He says the pill makes him drowsy.  He can function, but he feels like his equilibrium is off like he's "mild drunk or high or something".  If he sits, he'll fall asleep.    Feet are doing fine.   No gout trouble for a while.  Got good-fitting shoes that he says are ugly but feel good.    Left knee had been acting up, but it's calming down.    CBGs lowest was 89 when he got up to have breakfast one morning over the weekend.  Does not eat before the gym and never has.  Takes diabetic sugar  tabs in case.  The "little old ladies" keep candy there too.    Past Medical History:  Diagnosis Date  . Asymptomatic varicose veins   . Chronic kidney disease   . Diaphragmatic hernia without mention of obstruction or gangrene   . DM (diabetes mellitus) ([place])   . GERD (gastroesophageal reflux disease)   . Gout, unspecified   . Hypercalcemia   . Hyperlipidemia   . Hyperosmolality and/or hypernatremia   . Hypertension   . Monoclonal paraproteinemia   . New daily persistent headache   . Osteoarthrosis, unspecified whether generalized or localized, lower leg   . Other malaise and fatigue   . Primary localized osteoarthrosis, unspecified site   . Unspecified disorder of kidney and ureter   . Unspecified hereditary and idiopathic peripheral neuropathy     Past Surgical History:  Procedure Laterality Date  . BIOPSY  10/19/2018   Procedure: BIOPSY;  Surgeon: Ronald Lobo, MD;  Location: [facility] ENDOSCOPY;  Service: Endoscopy;;  . CARPAL TUNNEL RELEASE  2007  . COLONOSCOPY N/A 06/06/2013   Procedure: COLONOSCOPY;  Surgeon: Cleotis Nipper, MD;  Location: [facility] ENDOSCOPY;  Service: Endoscopy;  Laterality: N/A;  . COLONOSCOPY WITH PROPOFOL N/A 10/19/2018  Procedure: COLONOSCOPY WITH PROPOFOL;  Surgeon: Ronald Lobo, MD;  Location: [facility] ENDOSCOPY;  Service: Endoscopy;  Laterality: N/A;  . POLYPECTOMY  10/19/2018   Procedure: POLYPECTOMY;  Surgeon: Ronald Lobo, MD;  Location: [facility] ENDOSCOPY;  Service: Endoscopy;;  . TUMOR REMOVAL  2017   Pituitary adenoma    No Known Allergies  Outpatient Encounter Medications as of 10/22/2020  Medication Sig  . allopurinol (ZYLOPRIM) 100 MG tablet TAKE 1 TABLET DAILY  . amLODipine (NORVASC) 10 MG tablet TAKE 1 TABLET DAILY FOR BLOOD PRESSURE  . aspirin EC 81 MG tablet Take 1 tablet (81 mg total) by mouth daily.  Marland Kitchen atorvastatin (LIPITOR) 40 MG tablet TAKE 1 TABLET DAILY  . chlorthalidone (HYGROTON) 25 MG tablet TAKE 1 TABLET DAILY FOR BLOOD PRESSURE  .  Cholecalciferol (VITAMIN D3) 2000 units TABS Take 2,000 Units by mouth daily.   Marland Kitchen FREESTYLE LITE test strip CHECK BLOOD SUGAR THREE TIMES A DAY  . gabapentin (NEURONTIN) 300 MG capsule Take 1 capsule (300 mg total) by mouth daily.  . insulin lispro (HUMALOG KWIKPEN) 100 UNIT/ML KwikPen INJECT 22 UNITS BEFORE BREAKFAST, 26 UNITS BEFORE LUNCH, 26 UNITS BEFORE SUPPER PLUS SLIDING SCALE.  Marland Kitchen Insulin Pen Needle (SURE COMFORT PEN NEEDLES) 32G X 4 MM MISC Use 4 times daily before meals DX: E11.65  . irbesartan (AVAPRO) 300 MG tablet TAKE 1 TABLET DAILY  . LANTUS SOLOSTAR 100 UNIT/ML Solostar Pen INJECT 66 UNITS UNDER THE SKIN DAILY  . Omega-3 Fatty Acids (FISH OIL) 1200 MG CAPS Take 1,200 mg by mouth daily.   . pantoprazole (PROTONIX) 40 MG tablet TAKE 1 TABLET DAILY  . potassium chloride SA (KLOR-CON) 20 MEQ tablet TAKE 1 TABLET TWICE A DAY   No facility-administered encounter medications on file as of 10/22/2020.    Review of Systems:  Review of Systems  Constitutional: Negative for chills, fever and malaise/fatigue.  HENT: Negative for congestion, hearing loss and sore throat.   Eyes: Negative for blurred vision.  Respiratory: Negative for cough and shortness of breath.   Cardiovascular: Positive for leg swelling. Negative for chest pain and palpitations.       Right leg swells some, has varicosities right greater than left  Gastrointestinal: Negative for abdominal pain, blood in stool, constipation and melena.  Genitourinary: Negative for dysuria, frequency and urgency.  Musculoskeletal: Negative for joint pain.  Skin: Negative for itching and rash.  Neurological: Negative for dizziness and loss of consciousness.  Psychiatric/Behavioral: Negative for depression. The patient is not nervous/anxious and does not have insomnia.     Health Maintenance  Topic Date Due  . HEMOGLOBIN A1C  04/15/2021  . OPHTHALMOLOGY EXAM  07/18/2021  . FOOT EXAM  08/15/2021  . TETANUS/TDAP  04/13/2023  .  COLONOSCOPY (Pts 45-8yrs Insurance coverage will need to be confirmed)  10/19/2028  . INFLUENZA VACCINE  Completed  . COVID-19 Vaccine  Completed  . Hepatitis C Screening  Completed  . PNA vac Low Risk Adult  Completed    Physical Exam: Vitals:   10/22/20 0902  BP: 124/82  Pulse: 68  Temp: (!) 97.1 F (36.2 C)  TempSrc: Temporal  SpO2: 98%  Weight: (!) 392 lb 12.8 oz (178.2 kg)  Height: 5\' 9"  (1.753 m)   Body mass index is 58.01 kg/m. Physical Exam Vitals reviewed.  Constitutional:      General: He is not in acute distress.    Appearance: Normal appearance. He is obese. He is not toxic-appearing.  HENT:     Head:  Normocephalic and atraumatic.  Eyes:     Conjunctiva/sclera: Conjunctivae normal.     Pupils: Pupils are equal, round, and reactive to light.  Cardiovascular:     Rate and Rhythm: Normal rate and regular rhythm.     Pulses: Normal pulses.     Heart sounds: Normal heart sounds.  Pulmonary:     Effort: Pulmonary effort is normal.     Breath sounds: Normal breath sounds. No wheezing, rhonchi or rales.  Abdominal:     General: Bowel sounds are normal.     Palpations: Abdomen is soft.     Tenderness: There is no abdominal tenderness.  Musculoskeletal:        General: Normal range of motion.     Comments: Mild right vs left nonpitting edema, wearing diabetic socks  Skin:    General: Skin is warm and dry.  Neurological:     General: No focal deficit present.     Mental Status: He is alert and oriented to person, place, and time.  Psychiatric:        Mood and Affect: Mood normal.        Behavior: Behavior normal.        Thought Content: Thought content normal.        Judgment: Judgment normal.     Labs reviewed: Basic Metabolic Panel: Recent Labs    01/12/20 0816 05/29/20 1009 10/16/20 0000  NA 139 139 137  K 3.5 3.4* 3.8  CL 97* 99 100  CO2 30 28 27   GLUCOSE 158* 101* 149*  BUN 18 23 23   CREATININE 1.52* 1.42* 1.54*  CALCIUM 10.1 10.1 9.7    Liver Function Tests: Recent Labs    01/12/20 0816 05/29/20 1009  AST 16 14  ALT 15 13  BILITOT 0.3 0.3  PROT 7.3 7.2   No results for input(s): LIPASE, AMYLASE in the last 8760 hours. No results for input(s): AMMONIA in the last 8760 hours. CBC: Recent Labs    01/12/20 0816 05/29/20 1009 10/16/20 0000  WBC 10.4 11.7* 11.6*  NEUTROABS 5,158 5,522 5,800  HGB 12.6* 12.0* 12.6*  HCT 40.7 38.5 39.7  MCV 80.6 79.1* 79.7*  PLT 280 262 270   Lipid Panel: Recent Labs    05/29/20 1009 10/16/20 0000  CHOL 152 144  HDL 49 46  LDLCALC 86 80  TRIG 76 93  CHOLHDL 3.1 3.1   Lab Results  Component Value Date   HGBA1C 7.7 (H) 10/16/2020   Assessment/Plan 1. Type 2 diabetes mellitus with diabetic neuropathy, with long-term current use of insulin (H[facility]) -control slightly worse with holiday intake, but weight and recent activity and food better since -cont changes and f/u labs in 4 mos - CBC with Differential/Platelet; Future - BASIC METABOLIC PANEL WITH GFR; Future - Hemoglobin A1c; Future  2. Hyperlipidemia associated with type 2 diabetes mellitus ([place]) -cont to work on decreasing high fat foods and recently increased exercise regimen - Lipid panel; Future  3. Elevated uric acid in blood -we opted NOT to stop chlorthalidone b/c of this due to the fact that he is not having flare ups and feels well and it's working well for his bp -should he have a flare, we may need to change his bp med to decrease uric acid levels -cont allopurinol low dose due to CKD  4. Essential hypertension -bp great this am with current regimen--cont same and monitor  5. Obesity, morbid (more than 100 lbs over ideal weight or BMI > 40) (HCC) -  continue his current increased exercise plan and improved diet  -f/u in 4 mos  6. 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 Forde Dandy, hormones have been contributing to weight challenges  7. Chronic gout due to renal impairment  involving foot without tophus, unspecified laterality -cont current allopurinol (see above)  8. Claudication of both lower extremities ([place]) -ongoing symptoms resolving with rest, cont lipid and bp control   9. Pituitary microadenoma with hyperprolactinemia ([place]) -s/p resection  Labs/tests ordered:   Lab Orders     CBC with Differential/Platelet     BASIC METABOLIC PANEL WITH GFR     Hemoglobin A1c     Lipid panel  Next appt:  02/21/2021 med mgt, fasting labs before   Journey Ratterman L. Kellye Mizner, D.O. [facility] [address], [place] [address] Cell Phone (Mon-Fri 8am-5pm):  [phone] On Call:  [phone] & follow prompts after 5pm & weekends Office Phone:  [phone] Office Fax:  [phone]

## 2020-10-22 NOTE — Patient Instructions (Signed)
Keep up the good work with exercise and improved diet.

## 2020-10-23 ENCOUNTER — Other Ambulatory Visit: Payer: Self-pay | Admitting: Internal Medicine

## 2020-10-23 NOTE — Telephone Encounter (Signed)
rx sent to pharmacy by e-script  

## 2020-10-25 ENCOUNTER — Other Ambulatory Visit: Payer: Self-pay

## 2020-10-25 ENCOUNTER — Ambulatory Visit: Payer: Medicare Other | Admitting: Orthotics

## 2020-10-25 DIAGNOSIS — M2142 Flat foot [pes planus] (acquired), left foot: Secondary | ICD-10-CM

## 2020-10-25 DIAGNOSIS — E1142 Type 2 diabetes mellitus with diabetic polyneuropathy: Secondary | ICD-10-CM

## 2020-10-25 DIAGNOSIS — M2141 Flat foot [pes planus] (acquired), right foot: Secondary | ICD-10-CM

## 2020-10-25 NOTE — Progress Notes (Signed)

## 2020-10-30 ENCOUNTER — Telehealth: Payer: Self-pay | Admitting: *Deleted

## 2020-10-30 NOTE — Telephone Encounter (Signed)
Received fax from [facility] and [facility] requesting Statement of Certifying Physician for Therapeutic Shoes and OV note.  Printed Last OV note and attached and placed in Dr. Cyndi Lennert folder to review and sign.   To be faxed back to [phone]

## 2020-11-28 ENCOUNTER — Telehealth: Payer: Self-pay | Admitting: Family

## 2020-11-28 NOTE — Telephone Encounter (Signed)
Called to confirm AWV appt for 11/29/20. Patient was very adament about already having done this last month at the [place] and did not want to keep this appt. KM

## 2020-11-29 ENCOUNTER — Encounter: Payer: Medicare Other | Admitting: Family

## 2020-12-03 ENCOUNTER — Encounter: Payer: Self-pay | Admitting: Internal Medicine

## 2020-12-04 ENCOUNTER — Ambulatory Visit (INDEPENDENT_AMBULATORY_CARE_PROVIDER_SITE_OTHER): Payer: Medicare Other | Admitting: Podiatry

## 2020-12-04 ENCOUNTER — Other Ambulatory Visit: Payer: Self-pay | Admitting: Internal Medicine

## 2020-12-04 ENCOUNTER — Encounter: Payer: Self-pay | Admitting: Podiatry

## 2020-12-04 ENCOUNTER — Other Ambulatory Visit: Payer: Self-pay

## 2020-12-04 DIAGNOSIS — B351 Tinea unguium: Secondary | ICD-10-CM

## 2020-12-04 DIAGNOSIS — M79675 Pain in left toe(s): Secondary | ICD-10-CM | POA: Diagnosis not present

## 2020-12-04 DIAGNOSIS — M2142 Flat foot [pes planus] (acquired), left foot: Secondary | ICD-10-CM

## 2020-12-04 DIAGNOSIS — E1142 Type 2 diabetes mellitus with diabetic polyneuropathy: Secondary | ICD-10-CM

## 2020-12-04 DIAGNOSIS — M2141 Flat foot [pes planus] (acquired), right foot: Secondary | ICD-10-CM

## 2020-12-04 DIAGNOSIS — M79674 Pain in right toe(s): Secondary | ICD-10-CM | POA: Diagnosis not present

## 2020-12-08 NOTE — Progress Notes (Signed)
Subjective: Adrian Matthews is a 75 y.o. male patient seen today at risk foot care. Pt has h/o NIDDM with chronic kidney disease and painful mycotic nails b/l that are difficult to trim. Pain interferes with ambulation. Aggravating factors include wearing enclosed shoe gear. Pain is relieved with periodic professional debridement.    He relates no new pedal problems on today's visit.  His PCP is Dr. Hollace Kinnier. Last visit was 06/07/2020.  No Known Allergies  Objective: Physical Exam  General: Adrian Matthews is a pleasant 75 y.o. y.o. AA male, morbidly obese in NAD. AAO x 3.   Vascular:  Neurovascular status unchanged b/l lower extremities. Capillary refill time to digits immediate b/l. Palpable pedal pulses b/l LE. Pedal hair present. Lower extremity skin temperature gradient within normal limits. No edema noted b/l lower extremities.  Dermatological:  Pedal skin with normal turgor, texture and tone bilaterally. No open wounds bilaterally. No interdigital macerations bilaterally. Toenails 1-5 b/l elongated, dystrophic, thickened, crumbly with subungual debris and tenderness to dorsal palpation.  Musculoskeletal:  Normal muscle strength 5/5 to all lower extremity muscle groups bilaterally. No gross bony deformities bilaterally. No pain crepitus or joint limitation noted with ROM b/l. Patient ambulates independent of any assistive aids.  Neurological:  Protective sensation intact 5/5 intact bilaterally with 10g monofilament b/l. Vibratory sensation intact b/l. Proprioception intact bilaterally.  Assessment and Plan:  1. Pain due to onychomycosis of toenails of both feet   2. Pes planus of both feet   3. Diabetic peripheral neuropathy associated with type 2 diabetes mellitus ([place])    -Examined patient. -No new findings. No new orders. -Continue diabetic foot care principles. Literature dispensed on today.  -Toenails 1-5 b/l were debrided in length and girth with sterile nail  nippers and dremel without iatrogenic bleeding.  -Patient to continue soft, supportive shoe gear daily. -Patient to report any pedal injuries to medical professional immediately. -Patient/POA to call should there be question/concern in the interim.  Return in about 9 weeks (around 10/29/2020) or soonest appointment available.  Marzetta Board, DPM

## 2020-12-19 ENCOUNTER — Other Ambulatory Visit: Payer: Self-pay

## 2020-12-19 ENCOUNTER — Ambulatory Visit (INDEPENDENT_AMBULATORY_CARE_PROVIDER_SITE_OTHER): Payer: Medicare Other | Admitting: Podiatrist

## 2020-12-19 DIAGNOSIS — M2142 Flat foot [pes planus] (acquired), left foot: Secondary | ICD-10-CM

## 2020-12-19 DIAGNOSIS — M2141 Flat foot [pes planus] (acquired), right foot: Secondary | ICD-10-CM

## 2020-12-19 DIAGNOSIS — E1140 Type 2 diabetes mellitus with diabetic neuropathy, unspecified: Secondary | ICD-10-CM

## 2020-12-19 DIAGNOSIS — E1142 Type 2 diabetes mellitus with diabetic polyneuropathy: Secondary | ICD-10-CM

## 2020-12-19 NOTE — Progress Notes (Signed)
The patient presented to the office to day to pick up diabetic shoes and 3 pr diabetic custom inserts.  1 pr of  inserts were put in the shoes and the shoes were fitted to the patient.  The patient states they are comfortable and free of defect. The patient  was satisfied with the fit of the shoe.  Instructions for break in and wear were dispensed.  The patient signed the delivery documentation and break in instruction form.

## 2020-12-20 ENCOUNTER — Telehealth: Payer: Self-pay | Admitting: Nurse Practitioner

## 2020-12-20 DIAGNOSIS — D352 Benign neoplasm of pituitary gland: Secondary | ICD-10-CM | POA: Diagnosis not present

## 2020-12-20 DIAGNOSIS — M1A30X0 Chronic gout due to renal impairment, unspecified site, without tophus (tophi): Secondary | ICD-10-CM | POA: Diagnosis not present

## 2020-12-20 DIAGNOSIS — E1129 Type 2 diabetes mellitus with other diabetic kidney complication: Secondary | ICD-10-CM | POA: Diagnosis not present

## 2020-12-20 DIAGNOSIS — D126 Benign neoplasm of colon, unspecified: Secondary | ICD-10-CM | POA: Diagnosis not present

## 2020-12-20 DIAGNOSIS — I10 Essential (primary) hypertension: Secondary | ICD-10-CM | POA: Diagnosis not present

## 2020-12-20 DIAGNOSIS — N2581 Secondary hyperparathyroidism of renal origin: Secondary | ICD-10-CM | POA: Diagnosis not present

## 2020-12-20 DIAGNOSIS — Z794 Long term (current) use of insulin: Secondary | ICD-10-CM | POA: Diagnosis not present

## 2020-12-20 DIAGNOSIS — E221 Hyperprolactinemia: Secondary | ICD-10-CM | POA: Diagnosis not present

## 2020-12-20 DIAGNOSIS — E785 Hyperlipidemia, unspecified: Secondary | ICD-10-CM | POA: Diagnosis not present

## 2020-12-20 DIAGNOSIS — E291 Testicular hypofunction: Secondary | ICD-10-CM | POA: Diagnosis not present

## 2020-12-20 DIAGNOSIS — N1831 Chronic kidney disease, stage 3a: Secondary | ICD-10-CM | POA: Diagnosis not present

## 2020-12-20 NOTE — Telephone Encounter (Signed)
Called to schedule AWV. Patient declined, says he's already had it done at the [place].

## 2020-12-20 NOTE — Telephone Encounter (Signed)
-----   Message from Lauree Chandler, NP sent at 12/13/2020  1:29 PM EST ----- Last AWV was 11/29/19  Please call to schedule Thank you

## 2020-12-21 DIAGNOSIS — D352 Benign neoplasm of pituitary gland: Secondary | ICD-10-CM | POA: Diagnosis not present

## 2020-12-21 DIAGNOSIS — E785 Hyperlipidemia, unspecified: Secondary | ICD-10-CM | POA: Diagnosis not present

## 2020-12-21 DIAGNOSIS — E1129 Type 2 diabetes mellitus with other diabetic kidney complication: Secondary | ICD-10-CM | POA: Diagnosis not present

## 2021-02-11 ENCOUNTER — Other Ambulatory Visit: Payer: Self-pay | Admitting: *Deleted

## 2021-02-11 MED ORDER — AMLODIPINE BESYLATE 10 MG PO TABS: 1.0000 | ORAL_TABLET | Freq: Every day | ORAL | 3 refills | Status: DC

## 2021-02-11 NOTE — Telephone Encounter (Signed)
Pharmacy requested refill

## 2021-02-13 ENCOUNTER — Other Ambulatory Visit: Payer: Self-pay

## 2021-02-13 ENCOUNTER — Other Ambulatory Visit: Payer: Medicare Other

## 2021-02-13 DIAGNOSIS — E1169 Type 2 diabetes mellitus with other specified complication: Secondary | ICD-10-CM

## 2021-02-13 DIAGNOSIS — E1149 Type 2 diabetes mellitus with other diabetic neurological complication: Secondary | ICD-10-CM | POA: Diagnosis not present

## 2021-02-13 DIAGNOSIS — E785 Hyperlipidemia, unspecified: Secondary | ICD-10-CM

## 2021-02-13 DIAGNOSIS — E1140 Type 2 diabetes mellitus with diabetic neuropathy, unspecified: Secondary | ICD-10-CM

## 2021-02-14 ENCOUNTER — Other Ambulatory Visit: Payer: Medicare Other

## 2021-02-14 LAB — BASIC METABOLIC PANEL WITH GFR
BUN/Creatinine Ratio: 14 (calc) (ref 6–22)
BUN: 20 mg/dL (ref 7–25)
CO2: 27 mmol/L (ref 20–32)
Calcium: 10 mg/dL (ref 8.6–10.3)
Chloride: 99 mmol/L (ref 98–110)
Creat: 1.48 mg/dL — ABNORMAL HIGH (ref 0.70–1.18)
GFR, Est African American: 53 mL/min/{1.73_m2} — ABNORMAL LOW (ref >=60)
GFR, Est Non African American: 46 mL/min/{1.73_m2} — ABNORMAL LOW (ref >=60)
Glucose, Bld: 105 mg/dL — ABNORMAL HIGH (ref 65–99)
Potassium: 3.6 mmol/L (ref 3.5–5.3)
Sodium: 138 mmol/L (ref 135–146)

## 2021-02-14 LAB — CBC WITH DIFFERENTIAL/PLATELET
Absolute Monocytes: 307 cells/uL (ref 200–950)
Basophils Absolute: 30 cells/uL (ref 0–200)
Basophils Relative: 0.3 %
Eosinophils Absolute: 228 cells/uL (ref 15–500)
Eosinophils Relative: 2.3 %
HCT: 39.7 % (ref 38.5–50.0)
Hemoglobin: 12.1 g/dL — ABNORMAL LOW (ref 13.2–17.1)
Lymphs Abs: 4406 cells/uL — ABNORMAL HIGH (ref 850–3900)
MCH: 24.3 pg — ABNORMAL LOW (ref 27.0–33.0)
MCHC: 30.5 g/dL — ABNORMAL LOW (ref 32.0–36.0)
MCV: 79.9 fL — ABNORMAL LOW (ref 80.0–100.0)
MPV: 10.4 fL (ref 7.5–12.5)
Monocytes Relative: 3.1 %
Neutro Abs: 4930 cells/uL (ref 1500–7800)
Neutrophils Relative %: 49.8 %
Platelets: 251 10*3/uL (ref 140–400)
RBC: 4.97 10*6/uL (ref 4.20–5.80)
RDW: 15.9 % — ABNORMAL HIGH (ref 11.0–15.0)
Total Lymphocyte: 44.5 %
WBC: 9.9 10*3/uL (ref 3.8–10.8)

## 2021-02-14 LAB — HEMOGLOBIN A1C
Hgb A1c MFr Bld: 7.5 % of total Hgb — ABNORMAL HIGH (ref <5.7)
Mean Plasma Glucose: 169 mg/dL
eAG (mmol/L): 9.3 mmol/L

## 2021-02-14 LAB — LIPID PANEL
Cholesterol: 134 mg/dL (ref <200)
HDL: 50 mg/dL (ref >=40)
LDL Cholesterol (Calc): 68 mg/dL (calc)
Non-HDL Cholesterol (Calc): 84 mg/dL (calc) (ref <130)
Total CHOL/HDL Ratio: 2.7 (calc) (ref <5.0)
Triglycerides: 75 mg/dL (ref <150)

## 2021-02-18 ENCOUNTER — Ambulatory Visit: Payer: Medicare Other | Admitting: Internal Medicine

## 2021-02-19 ENCOUNTER — Other Ambulatory Visit: Payer: Self-pay | Admitting: *Deleted

## 2021-02-19 DIAGNOSIS — N2581 Secondary hyperparathyroidism of renal origin: Secondary | ICD-10-CM | POA: Diagnosis not present

## 2021-02-19 DIAGNOSIS — D631 Anemia in chronic kidney disease: Secondary | ICD-10-CM | POA: Diagnosis not present

## 2021-02-19 DIAGNOSIS — N1831 Chronic kidney disease, stage 3a: Secondary | ICD-10-CM | POA: Diagnosis not present

## 2021-02-19 DIAGNOSIS — I129 Hypertensive chronic kidney disease with stage 1 through stage 4 chronic kidney disease, or unspecified chronic kidney disease: Secondary | ICD-10-CM | POA: Diagnosis not present

## 2021-02-19 MED ORDER — ALLOPURINOL 100 MG PO TABS: 100.0000 mg | ORAL_TABLET | Freq: Every day | ORAL | 0 refills | Status: DC

## 2021-02-19 NOTE — Telephone Encounter (Signed)
[facility] requested 

## 2021-02-20 ENCOUNTER — Encounter: Payer: Self-pay | Admitting: Family Medicine

## 2021-02-20 ENCOUNTER — Ambulatory Visit (INDEPENDENT_AMBULATORY_CARE_PROVIDER_SITE_OTHER): Payer: Medicare Other | Admitting: Family Medicine

## 2021-02-20 ENCOUNTER — Other Ambulatory Visit: Payer: Self-pay

## 2021-02-20 VITALS — BP 148/88 | HR 89 | Temp 96.8°F | Resp 18 | Ht 69.0 in | Wt 397.8 lb

## 2021-02-20 DIAGNOSIS — E785 Hyperlipidemia, unspecified: Secondary | ICD-10-CM

## 2021-02-20 DIAGNOSIS — E1169 Type 2 diabetes mellitus with other specified complication: Secondary | ICD-10-CM | POA: Diagnosis not present

## 2021-02-20 DIAGNOSIS — E1122 Type 2 diabetes mellitus with diabetic chronic kidney disease: Secondary | ICD-10-CM

## 2021-02-20 DIAGNOSIS — N1831 Chronic kidney disease, stage 3a: Secondary | ICD-10-CM

## 2021-02-20 DIAGNOSIS — I739 Peripheral vascular disease, unspecified: Secondary | ICD-10-CM | POA: Diagnosis not present

## 2021-02-20 DIAGNOSIS — E229 Hyperfunction of pituitary gland, unspecified: Secondary | ICD-10-CM

## 2021-02-20 DIAGNOSIS — N1832 Chronic kidney disease, stage 3b: Secondary | ICD-10-CM

## 2021-02-20 DIAGNOSIS — D352 Benign neoplasm of pituitary gland: Secondary | ICD-10-CM

## 2021-02-20 DIAGNOSIS — Z794 Long term (current) use of insulin: Secondary | ICD-10-CM

## 2021-02-20 DIAGNOSIS — I10 Essential (primary) hypertension: Secondary | ICD-10-CM | POA: Diagnosis not present

## 2021-02-20 MED ORDER — TRAMADOL HCL 50 MG PO TABS: 50.0000 mg | ORAL_TABLET | Freq: Three times a day (TID) | ORAL | 0 refills | Status: AC | PRN

## 2021-02-20 NOTE — Progress Notes (Signed)
Provider:  Alain Honey, MD  Careteam: Patient Care Team: Wardell Honour, MD as PCP - General (Family Medicine) Elmarie Shiley, MD as Consulting Physician (Nephrology) Sharyne Peach, MD as Consulting Physician (Ophthalmology) Ronald Lobo, MD as Consulting Physician (Gastroenterology)  PLACE OF SERVICE:  [place] Directive information Does Patient Have a Medical Advance Directive?: No, Would patient like information on creating a medical advance directive?: No - Patient declined  No Known Allergies  Chief Complaint  Patient presents with  . Medical Management of Chronic Issues    4 month follow up.  . Immunizations    Discuss the need for 2nd Covid Booster.      HPI: Patient is a 75 y.o. male routine 65-month follow-up complains today of some arthritis pain.  Previous physician here had given him some tramadol which she felt was effective. Weight continues to be a major issue.  Diabetes is however tolerable with A1c of 7.5 on the long-acting as well as short acting insulin with meals. Is due for second COVID vaccine which she will get.  Review of Systems:  Review of Systems  Constitutional: Negative.   Eyes: Negative.   Respiratory: Negative.   Cardiovascular: Negative.   Gastrointestinal: Negative.   Genitourinary: Negative.   Musculoskeletal: Positive for joint pain.  Neurological: Negative.   Psychiatric/Behavioral: Negative.   All other systems reviewed and are negative.   Past Medical History:  Diagnosis Date  . Asymptomatic varicose veins   . Chronic kidney disease   . Diaphragmatic hernia without mention of obstruction or gangrene   . DM (diabetes mellitus) ([place])   . GERD (gastroesophageal reflux disease)   . Gout, unspecified   . Hypercalcemia   . Hyperlipidemia   . Hyperosmolality and/or hypernatremia   . Hypertension   . Monoclonal paraproteinemia   . New daily persistent headache   . Osteoarthrosis, unspecified whether  generalized or localized, lower leg   . Other malaise and fatigue   . Primary localized osteoarthrosis, unspecified site   . Unspecified disorder of kidney and ureter   . Unspecified hereditary and idiopathic peripheral neuropathy    Past Surgical History:  Procedure Laterality Date  . BIOPSY  10/19/2018   Procedure: BIOPSY;  Surgeon: Ronald Lobo, MD;  Location: [facility];  Service: Endoscopy;;  . CARPAL TUNNEL RELEASE  2007  . COLONOSCOPY N/A 06/06/2013   Procedure: COLONOSCOPY;  Surgeon: Cleotis Nipper, MD;  Location: [facility] ENDOSCOPY;  Service: Endoscopy;  Laterality: N/A;  . COLONOSCOPY WITH PROPOFOL N/A 10/19/2018   Procedure: COLONOSCOPY WITH PROPOFOL;  Surgeon: Ronald Lobo, MD;  Location: [facility];  Service: Endoscopy;  Laterality: N/A;  . POLYPECTOMY  10/19/2018   Procedure: POLYPECTOMY;  Surgeon: Ronald Lobo, MD;  Location: [facility];  Service: Endoscopy;;  . TUMOR REMOVAL  2017   Pituitary adenoma   Social History:   reports that he has never smoked. He has never used smokeless tobacco. He reports that he does not drink alcohol and does not use drugs.  Family History  Problem Relation Age of Onset  . Hypertension Sister   . Hypertension Brother   . Diabetes Brother   . Hypertension Brother   . Hypertension Sister   . Migraines Neg Hx     Medications: Patient's Medications  New Prescriptions   TRAMADOL (ULTRAM) 50 MG TABLET    Take 1 tablet (50 mg total) by mouth every 8 (eight) hours as needed for up to 5 days.  Previous Medications  ALLOPURINOL (ZYLOPRIM) 100 MG TABLET    Take 1 tablet (100 mg total) by mouth daily.   AMLODIPINE (NORVASC) 10 MG TABLET    Take 1 tablet (10 mg total) by mouth daily. for blood pressure   ASPIRIN EC 81 MG TABLET    Take 1 tablet (81 mg total) by mouth daily.   ATORVASTATIN (LIPITOR) 40 MG TABLET    TAKE 1 TABLET DAILY   CHLORTHALIDONE (HYGROTON) 25 MG TABLET    TAKE 1 TABLET DAILY FOR BLOOD PRESSURE   CHOLECALCIFEROL  (VITAMIN D3) 2000 UNITS TABS    Take 2,000 Units by mouth daily.    FREESTYLE LITE TEST STRIP    CHECK BLOOD SUGAR THREE TIMES A DAY   GABAPENTIN (NEURONTIN) 300 MG CAPSULE    Take 1 capsule (300 mg total) by mouth daily.   INSULIN LISPRO (HUMALOG KWIKPEN) 100 UNIT/ML KWIKPEN    INJECT 22 UNITS BEFORE BREAKFAST, 26 UNITS BEFORE LUNCH, 26 UNITS BEFORE SUPPER PLUS SLIDING SCALE.   INSULIN PEN NEEDLE (SURE COMFORT PEN NEEDLES) 32G X 4 MM MISC    Use 4 times daily before meals DX: E11.65   IRBESARTAN (AVAPRO) 300 MG TABLET    TAKE 1 TABLET DAILY   LANTUS SOLOSTAR 100 UNIT/ML SOLOSTAR PEN    INJECT 66 UNITS UNDER THE SKIN DAILY   OMEGA-3 FATTY ACIDS (FISH OIL) 1200 MG CAPS    Take 1,200 mg by mouth daily.    PANTOPRAZOLE (PROTONIX) 40 MG TABLET    TAKE 1 TABLET DAILY   POTASSIUM CHLORIDE SA (KLOR-CON) 20 MEQ TABLET    TAKE 1 TABLET TWICE A DAY  Modified Medications   No medications on file  Discontinued Medications   No medications on file    Physical Exam:  Vitals:   02/20/21 0856  BP: (!) 148/88  Pulse: 89  Resp: 18  Temp: (!) 96.8 F (36 C)  SpO2: 94%  Weight: (!) 397 lb 12.8 oz (180.4 kg)  Height: 5\' 9"  (1.753 m)   Body mass index is 58.74 kg/m. Wt Readings from Last 3 Encounters:  02/20/21 (!) 397 lb 12.8 oz (180.4 kg)  10/22/20 (!) 392 lb 12.8 oz (178.2 kg)  06/07/20 (!) 397 lb 3.2 oz (180.2 kg)    Physical Exam Vitals and nursing note reviewed.  Constitutional:      Appearance: Normal appearance. He is obese.  Cardiovascular:     Rate and Rhythm: Normal rate and regular rhythm.  Pulmonary:     Effort: Pulmonary effort is normal.     Breath sounds: Normal breath sounds.  Abdominal:     Palpations: Abdomen is soft.  Musculoskeletal:     Cervical back: Normal range of motion.  Neurological:     General: No focal deficit present.     Mental Status: He is alert and oriented to person, place, and time.  Psychiatric:        Mood and Affect: Mood normal.         Behavior: Behavior normal.        Thought Content: Thought content normal.        Judgment: Judgment normal.     Labs reviewed: Basic Metabolic Panel: Recent Labs    05/29/20 1009 10/16/20 0000 02/13/21 0000  NA 139 137 138  K 3.4* 3.8 3.6  CL 99 100 99  CO2 28 27 27   GLUCOSE 101* 149* 105*  BUN 23 23 20   CREATININE 1.42* 1.54* 1.48*  CALCIUM 10.1 9.7 10.0  Liver Function Tests: Recent Labs    05/29/20 1009  AST 14  ALT 13  BILITOT 0.3  PROT 7.2   No results for input(s): LIPASE, AMYLASE in the last 8760 hours. No results for input(s): AMMONIA in the last 8760 hours. CBC: Recent Labs    05/29/20 1009 10/16/20 0000 02/13/21 0000  WBC 11.7* 11.6* 9.9  NEUTROABS 5,522 5,800 4,930  HGB 12.0* 12.6* 12.1*  HCT 38.5 39.7 39.7  MCV 79.1* 79.7* 79.9*  PLT 262 270 251   Lipid Panel: Recent Labs    05/29/20 1009 10/16/20 0000 02/13/21 0000  CHOL 152 144 134  HDL 49 46 50  LDLCALC 86 80 68  TRIG 76 93 75  CHOLHDL 3.1 3.1 2.7   TSH: No results for input(s): TSH in the last 8760 hours. A1C: Lab Results  Component Value Date   HGBA1C 7.5 (H) 02/13/2021     Assessment/Plan  1. Claudication of both lower extremities ([place]) Patient will take as needed do not foresee a problem - traMADol (ULTRAM) 50 MG tablet; Take 1 tablet (50 mg total) by mouth every 8 (eight) hours as needed for up to 5 days.  Dispense: 15 tablet; Refill: 0  2. Diabetes mellitus associated with hormonal etiology ([place]) As noted above A1c is tolerable at 7.5 and that may be a reasonable goal for him given his age and obesity - Microalbumin / creatinine urine ratio  3. Essential hypertension Blood pressure okay at 148/88 continue with irbesartan and amlodipine  4. Type 2 diabetes mellitus with stage 3b chronic kidney disease, with long-term current use of insulin (HCC) BUN and creatinine are stable  5. Hyperlipidemia associated with type 2 diabetes mellitus (HCC) Lipids are good and at  goal with LDL of 68  6. Pituitary microadenoma with hyperprolactinemia ([place]) Is followed by endocrinology is on no specific hormone replacement after pituitary surgery  7. Stage 3a chronic kidney disease ([place]) Stable   Alain Honey, MD [place] [phone]

## 2021-02-20 NOTE — Patient Instructions (Signed)
Continue to watch carbs in diet Use Tramadol as needed for pain

## 2021-02-21 ENCOUNTER — Ambulatory Visit: Payer: Medicare Other | Admitting: Internal Medicine

## 2021-02-21 LAB — MICROALBUMIN / CREATININE URINE RATIO
Creatinine, Urine: 106 mg/dL (ref 20–320)
Microalb Creat Ratio: 11 mcg/mg creat (ref <30)
Microalb, Ur: 1.2 mg/dL

## 2021-03-01 ENCOUNTER — Telehealth: Payer: Self-pay | Admitting: *Deleted

## 2021-03-01 NOTE — Telephone Encounter (Signed)
The medication was never added to current medication list nor sent to pharmacy. Pharmacy never received Rx.

## 2021-03-01 NOTE — Telephone Encounter (Signed)
He only sent a 5 day refill. Patient will need to schedule with Dr. Sabra Heck to sign pain contract and discuss further use of medication.

## 2021-03-01 NOTE — Telephone Encounter (Signed)
Patient called and stated that the Tramadol that was suppose to be sent to pharmacy on 02/20/2021 was never received. Patient is requesting Rx to be sent to pharmacy.   Medication was not entered into Current medication list. Reviewed last OV note and Pended Rx and sent to Amy for approval (Dr. Sabra Heck not in office)    OV NOTE Dated 02/20/2021: Assessment/Plan  1. Claudication of both lower extremities ([place]) Patient will take as needed do not foresee a problem - traMADol (ULTRAM) 50 MG tablet; Take 1 tablet (50 mg total) by mouth every 8 (eight) hours as needed for up to 5 days.  Dispense: 15 tablet; Refill: 0

## 2021-03-01 NOTE — Telephone Encounter (Signed)
Patient notified and agreed. Stated that [facility] told him that they did ship out the medication.  Stated that he will contact [facility].

## 2021-03-01 NOTE — Telephone Encounter (Signed)
PMPD review. [facility] by Dr. Sabra Heck. They should contact mail pharmacy to see if it was sent.

## 2021-03-08 ENCOUNTER — Telehealth: Payer: Self-pay

## 2021-03-08 DIAGNOSIS — I10 Essential (primary) hypertension: Secondary | ICD-10-CM

## 2021-03-08 MED ORDER — IRBESARTAN 300 MG PO TABS: 300.0000 mg | ORAL_TABLET | Freq: Every day | ORAL | 1 refills | Status: DC

## 2021-03-08 NOTE — Telephone Encounter (Signed)
Refill request received from [facility]

## 2021-03-18 ENCOUNTER — Other Ambulatory Visit: Payer: Self-pay | Admitting: *Deleted

## 2021-03-18 DIAGNOSIS — E876 Hypokalemia: Secondary | ICD-10-CM

## 2021-03-18 MED ORDER — POTASSIUM CHLORIDE CRYS ER 20 MEQ PO TBCR: 20.0000 meq | EXTENDED_RELEASE_TABLET | Freq: Two times a day (BID) | ORAL | 1 refills | Status: DC

## 2021-03-18 MED ORDER — CHLORTHALIDONE 25 MG PO TABS: 25.0000 mg | ORAL_TABLET | Freq: Every day | ORAL | 1 refills | Status: DC

## 2021-03-18 NOTE — Telephone Encounter (Signed)
[facility] requested refill.  

## 2021-03-19 ENCOUNTER — Encounter: Payer: Self-pay | Admitting: Podiatry

## 2021-03-19 ENCOUNTER — Other Ambulatory Visit: Payer: Self-pay

## 2021-03-19 ENCOUNTER — Ambulatory Visit (INDEPENDENT_AMBULATORY_CARE_PROVIDER_SITE_OTHER): Payer: Medicare Other | Admitting: Podiatry

## 2021-03-19 DIAGNOSIS — B351 Tinea unguium: Secondary | ICD-10-CM | POA: Diagnosis not present

## 2021-03-19 DIAGNOSIS — M79675 Pain in left toe(s): Secondary | ICD-10-CM | POA: Diagnosis not present

## 2021-03-19 DIAGNOSIS — M79674 Pain in right toe(s): Secondary | ICD-10-CM | POA: Diagnosis not present

## 2021-03-24 NOTE — Progress Notes (Signed)
Subjective: Adrian Matthews is a pleasant 75 y.o. male patient seen today painful thick toenails that are difficult to trim. Pain interferes with ambulation. Aggravating factors include wearing enclosed shoe gear. Pain is relieved with periodic professional debridement.  He states his blood glucose was 150 mg/dl today.  He notes no new pedal problems on today's visit.  He states his new PCP is Wardell Honour, MD. Last visit was: 02/20/2021.  No Known Allergies  Objective: Physical Exam  General: Adrian Matthews is a pleasant 75 y.o. African American male, morbidly obese in NAD. AAO x 3.   Vascular:  Capillary refill time to digits immediate b/l. Palpable pedal pulses b/l LE. Pedal hair present. Lower extremity skin temperature gradient within normal limits.  Dermatological:  Pedal skin with normal turgor, texture and tone bilaterally. No open wounds bilaterally. No interdigital macerations bilaterally. Toenails 1-5 b/l elongated, discolored, dystrophic, thickened, crumbly with subungual debris and tenderness to dorsal palpation.  Musculoskeletal:  Normal muscle strength 5/5 to all lower extremity muscle groups bilaterally. No pain crepitus or joint limitation noted with ROM b/l. No gross bony deformities bilaterally. Patient ambulates independent of any assistive aids.  Neurological:  Pt has subjective symptoms of neuropathy. Protective sensation intact 5/5 intact bilaterally with 10g monofilament b/l.  Assessment and Plan:  1. Pain due to onychomycosis of toenails of both feet     -Examined patient. -Continue diabetic foot care principles. -Patient to continue soft, supportive shoe gear daily. -Toenails 1-5 b/l were debrided in length and girth with sterile nail nippers and dremel without iatrogenic bleeding.  -Patient to report any pedal injuries to medical professional immediately. -Patient/POA to call should there be question/concern in the interim.  Return in about 3 months  (around 06/19/2021).  Marzetta Board, DPM

## 2021-05-07 ENCOUNTER — Other Ambulatory Visit: Payer: Self-pay | Admitting: Family Medicine

## 2021-05-27 DIAGNOSIS — Z23 Encounter for immunization: Secondary | ICD-10-CM | POA: Diagnosis not present

## 2021-06-12 ENCOUNTER — Other Ambulatory Visit: Payer: Self-pay | Admitting: *Deleted

## 2021-06-12 DIAGNOSIS — E0822 Diabetes mellitus due to underlying condition with diabetic chronic kidney disease: Secondary | ICD-10-CM

## 2021-06-12 DIAGNOSIS — N182 Chronic kidney disease, stage 2 (mild): Secondary | ICD-10-CM

## 2021-06-12 DIAGNOSIS — E1122 Type 2 diabetes mellitus with diabetic chronic kidney disease: Secondary | ICD-10-CM

## 2021-06-12 MED ORDER — LANTUS SOLOSTAR 100 UNIT/ML ~~LOC~~ SOPN
PEN_INJECTOR | SUBCUTANEOUS | 3 refills | Status: DC
Start: 2021-06-12 — End: 2021-12-05

## 2021-06-12 MED ORDER — INSULIN LISPRO (1 UNIT DIAL) 100 UNIT/ML (KWIKPEN): PEN_INJECTOR | SUBCUTANEOUS | 3 refills | Status: DC

## 2021-06-12 NOTE — Telephone Encounter (Signed)
Patient requested refill

## 2021-06-24 DIAGNOSIS — N1831 Chronic kidney disease, stage 3a: Secondary | ICD-10-CM | POA: Diagnosis not present

## 2021-06-24 DIAGNOSIS — N2581 Secondary hyperparathyroidism of renal origin: Secondary | ICD-10-CM | POA: Diagnosis not present

## 2021-06-24 DIAGNOSIS — E1129 Type 2 diabetes mellitus with other diabetic kidney complication: Secondary | ICD-10-CM | POA: Diagnosis not present

## 2021-06-24 DIAGNOSIS — E291 Testicular hypofunction: Secondary | ICD-10-CM | POA: Diagnosis not present

## 2021-06-24 DIAGNOSIS — M1A30X0 Chronic gout due to renal impairment, unspecified site, without tophus (tophi): Secondary | ICD-10-CM | POA: Diagnosis not present

## 2021-06-24 DIAGNOSIS — E785 Hyperlipidemia, unspecified: Secondary | ICD-10-CM | POA: Diagnosis not present

## 2021-06-24 DIAGNOSIS — I10 Essential (primary) hypertension: Secondary | ICD-10-CM | POA: Diagnosis not present

## 2021-06-24 DIAGNOSIS — E221 Hyperprolactinemia: Secondary | ICD-10-CM | POA: Diagnosis not present

## 2021-06-24 DIAGNOSIS — Z794 Long term (current) use of insulin: Secondary | ICD-10-CM | POA: Diagnosis not present

## 2021-06-24 DIAGNOSIS — D352 Benign neoplasm of pituitary gland: Secondary | ICD-10-CM | POA: Diagnosis not present

## 2021-06-24 DIAGNOSIS — Z23 Encounter for immunization: Secondary | ICD-10-CM | POA: Diagnosis not present

## 2021-06-26 ENCOUNTER — Other Ambulatory Visit: Payer: Self-pay

## 2021-06-26 ENCOUNTER — Encounter: Payer: Self-pay | Admitting: Podiatry

## 2021-06-26 ENCOUNTER — Ambulatory Visit (INDEPENDENT_AMBULATORY_CARE_PROVIDER_SITE_OTHER): Payer: Medicare Other | Admitting: Podiatry

## 2021-06-26 DIAGNOSIS — D4860 Neoplasm of uncertain behavior of unspecified breast: Secondary | ICD-10-CM | POA: Insufficient documentation

## 2021-06-26 DIAGNOSIS — Z0289 Encounter for other administrative examinations: Secondary | ICD-10-CM | POA: Insufficient documentation

## 2021-06-26 DIAGNOSIS — M25561 Pain in right knee: Secondary | ICD-10-CM | POA: Insufficient documentation

## 2021-06-26 DIAGNOSIS — E1142 Type 2 diabetes mellitus with diabetic polyneuropathy: Secondary | ICD-10-CM | POA: Diagnosis not present

## 2021-06-26 DIAGNOSIS — L603 Nail dystrophy: Secondary | ICD-10-CM

## 2021-06-26 DIAGNOSIS — M79674 Pain in right toe(s): Secondary | ICD-10-CM

## 2021-06-26 DIAGNOSIS — M79675 Pain in left toe(s): Secondary | ICD-10-CM

## 2021-06-26 DIAGNOSIS — B351 Tinea unguium: Secondary | ICD-10-CM

## 2021-06-26 DIAGNOSIS — K219 Gastro-esophageal reflux disease without esophagitis: Secondary | ICD-10-CM | POA: Insufficient documentation

## 2021-06-26 NOTE — Progress Notes (Signed)
  Subjective:  Patient ID: Adrian Matthews, male    DOB: [birth year],  MRN: [id number]  75 y.o. male presents with at risk foot care with history of diabetic neuropathy and painful thick toenails that are difficult to trim. Pain interferes with ambulation. Aggravating factors include wearing enclosed shoe gear. Pain is relieved with periodic professional debridement..    Patient's blood sugar was 145 mg/dl this morning.  Last A1c was 6.6%.  He states his nails have really grown since his last visit and get caught on his socks and in his bed sheets. Patient states he continues to swim at the [facility] for exercise. He notes no other pedal issues on today's visit.  PCP: Wardell Honour, MD and last visit was: 02/20/2021  Review of Systems: Negative except as noted in the HPI.   No Known Allergies  Objective:  There were no vitals filed for this visit. Constitutional Patient is a pleasant 75 y.o. African American male morbidly obese in NAD. AAO x 3.  Vascular Capillary fill time to digits immediate b/l.  DP/PT pulse(s) are palpable b/l lower extremities. Pedal hair present. Lower extremity skin temperature gradient within normal limits. No pain with calf compression b/l. No edema noted b/l lower extremities. No cyanosis or clubbing noted.  Neurologic Protective sensation intact 5/5 intact bilaterally with 10g monofilament b/l. Vibratory sensation intact b/l. No clonus b/l. He does have subjective symptoms of neuropathy.  Dermatologic Pedal skin is warm and supple b/l.  No open wounds b/l lower extremities. No interdigital macerations b/l lower extremities. Toenails 1-5 b/l elongated, discolored, dystrophic, thickened, crumbly with subungual debris and tenderness to dorsal palpation. Right great toe nailplate with longitudinal split 60/40 with no skin disturbance. No erythema, no edema, no drainage, no fluctuance.   Orthopedic: Normal muscle strength 5/5 to all lower extremity muscle groups bilaterally.  Patient ambulates independent of any assistive aids. Pes planus b/l.   Hemoglobin A1C Latest Ref Rng & Units 02/13/2021 10/16/2020  HGBA1C <5.7 % of total Hgb 7.5(H) 7.7(H)  Some recent data might be hidden   Assessment:   1. Pain due to onychomycosis of toenails of both feet   2. Longitudinal split of nail   3. Diabetic peripheral neuropathy associated with type 2 diabetes mellitus ([place])    Plan:  Patient was evaluated and treated and all questions answered. Consent given for treatment as described below: -Examined patient. Due to length of toenails and split nailplate, will have him back in 9 weeks. Monitor for any acute changes. -Patient to continue soft, supportive shoe gear daily. -Toenails 1-5 b/l were debrided in length and girth with sterile nail nippers and dremel without iatrogenic bleeding.  -Patient to report any pedal injuries to medical professional immediately. -Patient/POA to call should there be question/concern in the interim.  Return in about 9 weeks (around 08/28/2021).  Marzetta Board, DPM

## 2021-06-28 ENCOUNTER — Other Ambulatory Visit: Payer: Medicare Other

## 2021-07-03 ENCOUNTER — Other Ambulatory Visit: Payer: Self-pay

## 2021-07-03 ENCOUNTER — Ambulatory Visit (INDEPENDENT_AMBULATORY_CARE_PROVIDER_SITE_OTHER): Payer: Medicare Other | Admitting: Family Medicine

## 2021-07-03 ENCOUNTER — Encounter: Payer: Self-pay | Admitting: Family Medicine

## 2021-07-03 VITALS — BP 144/92 | HR 85 | Temp 97.3°F | Ht 69.0 in | Wt 394.0 lb

## 2021-07-03 DIAGNOSIS — D352 Benign neoplasm of pituitary gland: Secondary | ICD-10-CM | POA: Diagnosis not present

## 2021-07-03 DIAGNOSIS — N1831 Chronic kidney disease, stage 3a: Secondary | ICD-10-CM | POA: Diagnosis not present

## 2021-07-03 DIAGNOSIS — E229 Hyperfunction of pituitary gland, unspecified: Secondary | ICD-10-CM

## 2021-07-03 DIAGNOSIS — E782 Mixed hyperlipidemia: Secondary | ICD-10-CM

## 2021-07-03 DIAGNOSIS — E1121 Type 2 diabetes mellitus with diabetic nephropathy: Secondary | ICD-10-CM | POA: Diagnosis not present

## 2021-07-03 DIAGNOSIS — N1832 Chronic kidney disease, stage 3b: Secondary | ICD-10-CM | POA: Diagnosis not present

## 2021-07-03 DIAGNOSIS — Z794 Long term (current) use of insulin: Secondary | ICD-10-CM | POA: Diagnosis not present

## 2021-07-03 DIAGNOSIS — E66813 Obesity, class 3: Secondary | ICD-10-CM

## 2021-07-03 DIAGNOSIS — E1122 Type 2 diabetes mellitus with diabetic chronic kidney disease: Secondary | ICD-10-CM

## 2021-07-03 DIAGNOSIS — Z6841 Body Mass Index (BMI) 40.0 and over, adult: Secondary | ICD-10-CM | POA: Diagnosis not present

## 2021-07-03 NOTE — Patient Instructions (Signed)
May try Fibro for neuropathy

## 2021-07-03 NOTE — Progress Notes (Signed)
Provider:  Alain Honey, MD  Careteam: Patient Care Team: Wardell Honour, MD as PCP - General (Family Medicine) Elmarie Shiley, MD as Consulting Physician (Nephrology) Sharyne Peach, MD as Consulting Physician (Ophthalmology) Ronald Lobo, MD as Consulting Physician (Gastroenterology)  PLACE OF SERVICE:  [place] Directive information    No Known Allergies  Chief Complaint  Patient presents with   Medical Management of Chronic Issues    Patient presents today for a 4 month follow-up.     HPI: Patient is a 75 y.o. male.  Routine follow-up visit for management of chronic problems including diabetes chronic kidney disease and hyperlipidemia and morbid obesity. He has lost some weight watching his carbs.  He saw endocrinologist last week and his most recent A1c there was 6.6 which is down from 7.5 back in May. He has appointment with eye doctor next week. He continues to swim about 3 days a week at the [facility]. Review of lab work from May shows that his LDL is at goal.  Kidney function shows chronic disease.  Review of Systems:  Review of Systems  Constitutional: Negative.   HENT: Negative.    Eyes: Negative.   Respiratory: Negative.    Cardiovascular: Negative.   Gastrointestinal: Negative.   Genitourinary: Negative.   Musculoskeletal: Negative.   Neurological: Negative.   Psychiatric/Behavioral: Negative.    All other systems reviewed and are negative.  Past Medical History:  Diagnosis Date   Asymptomatic varicose veins    Chronic kidney disease    Diaphragmatic hernia without mention of obstruction or gangrene    DM (diabetes mellitus) (HCC)    GERD (gastroesophageal reflux disease)    Gout, unspecified    Hypercalcemia    Hyperlipidemia    Hyperosmolality and/or hypernatremia    Hypertension    Monoclonal paraproteinemia    New daily persistent headache    Osteoarthrosis, unspecified whether generalized or localized, lower leg    Other  malaise and fatigue    Primary localized osteoarthrosis, unspecified site    Unspecified disorder of kidney and ureter    Unspecified hereditary and idiopathic peripheral neuropathy    Past Surgical History:  Procedure Laterality Date   BIOPSY  10/19/2018   Procedure: BIOPSY;  Surgeon: Ronald Lobo, MD;  [facility];  Service: Endoscopy;;   CARPAL TUNNEL RELEASE  2007   COLONOSCOPY N/A 06/06/2013   Procedure: COLONOSCOPY;  Surgeon: Cleotis Nipper, MD;  Location: [facility] ENDOSCOPY;  Service: Endoscopy;  Laterality: N/A;   COLONOSCOPY WITH PROPOFOL N/A 10/19/2018   Procedure: COLONOSCOPY WITH PROPOFOL;  Surgeon: Ronald Lobo, MD;  Location[facility];  Service: Endoscopy;  Laterality: N/A;   POLYPECTOMY  10/19/2018   Procedure: POLYPECTOMY;  Surgeon: Ronald Lobo, MD;  Location: [facility];  Service: Endoscopy;;   TUMOR REMOVAL  2017   Pituitary adenoma   Social History:   reports that he has never smoked. He has never used smokeless tobacco. He reports that he does not drink alcohol and does not use drugs.  Family History  Problem Relation Age of Onset   Hypertension Sister    Hypertension Brother    Diabetes Brother    Hypertension Brother    Hypertension Sister    Migraines Neg Hx     Medications: Patient's Medications  New Prescriptions   No medications on file  Previous Medications   ALLOPURINOL (ZYLOPRIM) 100 MG TABLET    TAKE 1 TABLET DAILY   AMLODIPINE (NORVASC) 10 MG TABLET  Take 1 tablet (10 mg total) by mouth daily. for blood pressure   ASPIRIN EC 81 MG TABLET    Take 1 tablet (81 mg total) by mouth daily.   ATORVASTATIN (LIPITOR) 40 MG TABLET    TAKE 1 TABLET DAILY   CHLORTHALIDONE (HYGROTON) 25 MG TABLET    Take 1 tablet (25 mg total) by mouth daily. for blood pressure   CHOLECALCIFEROL (VITAMIN D3) 2000 UNITS TABS    Take 2,000 Units by mouth daily.    FREESTYLE LITE TEST STRIP    CHECK BLOOD SUGAR THREE TIMES A DAY   GABAPENTIN (NEURONTIN) 300  MG CAPSULE    Take 1 capsule (300 mg total) by mouth daily.   INSULIN GLARGINE (LANTUS SOLOSTAR) 100 UNIT/ML SOLOSTAR PEN    Inject 66 units under the skin daily.   INSULIN LISPRO (HUMALOG KWIKPEN) 100 UNIT/ML KWIKPEN    INJECT 22 UNITS BEFORE BREAKFAST, 26 UNITS BEFORE LUNCH, 26 UNITS BEFORE SUPPER PLUS SLIDING SCALE.   INSULIN PEN NEEDLE (SURE COMFORT PEN NEEDLES) 32G X 4 MM MISC    Use 4 times daily before meals DX: E11.65   IRBESARTAN (AVAPRO) 300 MG TABLET    Take 1 tablet (300 mg total) by mouth daily.   OMEGA-3 FATTY ACIDS (FISH OIL) 1200 MG CAPS    Take 1,200 mg by mouth daily.    PANTOPRAZOLE (PROTONIX) 40 MG TABLET    TAKE 1 TABLET DAILY   POTASSIUM CHLORIDE SA (KLOR-CON) 20 MEQ TABLET    Take 1 tablet (20 mEq total) by mouth 2 (two) times daily.   TRAMADOL (ULTRAM) 50 MG TABLET      Modified Medications   No medications on file  Discontinued Medications   No medications on file    Physical Exam:  Vitals:   07/03/21 0817  BP: (!) 144/92  Pulse: 85  Temp: (!) 97.3 F (36.3 C)  TempSrc: Temporal  SpO2: 97%  Weight: (!) 394 lb (178.7 kg)  Height: 5\' 9"  (1.753 m)   Body mass index is 58.18 kg/m. Wt Readings from Last 3 Encounters:  07/03/21 (!) 394 lb (178.7 kg)  02/20/21 (!) 397 lb 12.8 oz (180.4 kg)  10/22/20 (!) 392 lb 12.8 oz (178.2 kg)    Physical Exam Constitutional:      Appearance: Normal appearance. He is obese.  Cardiovascular:     Rate and Rhythm: Normal rate and regular rhythm.  Pulmonary:     Effort: Pulmonary effort is normal.     Breath sounds: Normal breath sounds.  Musculoskeletal:        General: Normal range of motion.  Skin:    General: Skin is warm.  Neurological:     General: No focal deficit present.     Mental Status: He is alert and oriented to person, place, and time.    Labs reviewed: Basic Metabolic Panel: Recent Labs    10/16/20 0000 02/13/21 0000  NA 137 138  K 3.8 3.6  CL 100 99  CO2 27 27  GLUCOSE 149* 105*  BUN 23  20  CREATININE 1.54* 1.48*  CALCIUM 9.7 10.0   Liver Function Tests: No results for input(s): AST, ALT, ALKPHOS, BILITOT, PROT, ALBUMIN in the last 8760 hours. No results for input(s): LIPASE, AMYLASE in the last 8760 hours. No results for input(s): AMMONIA in the last 8760 hours. CBC: Recent Labs    10/16/20 0000 02/13/21 0000  WBC 11.6* 9.9  NEUTROABS 5,800 4,930  HGB 12.6* 12.1*  HCT 39.7  39.7  MCV 79.7* 79.9*  PLT 270 251   Lipid Panel: Recent Labs    10/16/20 0000 02/13/21 0000  CHOL 144 134  HDL 46 50  LDLCALC 80 68  TRIG 93 75  CHOLHDL 3.1 2.7   TSH: No results for input(s): TSH in the last 8760 hours. A1C: Lab Results  Component Value Date   HGBA1C 7.5 (H) 02/13/2021     Assessment/Plan   1. Benign neoplasm of pituitary gland ([place]) Followed by endocrinology.  Seen last week and placed with current status  2. Stage 3a chronic kidney disease (HCC) No changes will reassess renal function at next visit  3. Diabetic nephropathy associated with type 2 diabetes mellitus (HCC) A1c in good shape.  Followed by nephrologist for chronic kidney disease  4. Moderate mixed hyperlipidemia not requiring statin therapy Lipids at goal on atorvastatin.  Continue same  5. Type 2 diabetes mellitus with stage 3b chronic kidney disease, with long-term current use of insulin (HCC) Continues to do well on combination basal and short acting insulin  6. Pituitary microadenoma with hyperprolactinemia (HCC) Follow testosterone and thyroid function  7. Class 3 severe obesity with serious comorbidity and body mass index (BMI) of 50.0 to 59.9 in adult, unspecified obesity type [facility]) Patient is watching carbs swimming to control weight  Alain Honey, MD [facility] [phone]

## 2021-07-11 ENCOUNTER — Other Ambulatory Visit: Payer: Self-pay | Admitting: Family Medicine

## 2021-07-19 ENCOUNTER — Encounter: Payer: Self-pay | Admitting: *Deleted

## 2021-07-19 DIAGNOSIS — E119 Type 2 diabetes mellitus without complications: Secondary | ICD-10-CM | POA: Diagnosis not present

## 2021-07-19 DIAGNOSIS — H52203 Unspecified astigmatism, bilateral: Secondary | ICD-10-CM | POA: Diagnosis not present

## 2021-07-19 DIAGNOSIS — H5213 Myopia, bilateral: Secondary | ICD-10-CM | POA: Diagnosis not present

## 2021-07-19 LAB — HM DIABETES EYE EXAM

## 2021-08-15 DIAGNOSIS — Z23 Encounter for immunization: Secondary | ICD-10-CM | POA: Diagnosis not present

## 2021-08-17 ENCOUNTER — Other Ambulatory Visit: Payer: Self-pay | Admitting: Nurse Practitioner

## 2021-08-19 NOTE — Telephone Encounter (Signed)
Patient has request refill on medication "Tramadol". Patient last refill for medication was 07/11/2021 for up to 5 days. Patient has request refill for medication. Patient has no contract on file. Medication pend and sent to Sherrie Mustache, NP due to PCP Sabra Heck Lillette Boxer, MD being out of office.

## 2021-08-26 ENCOUNTER — Other Ambulatory Visit: Payer: Self-pay | Admitting: *Deleted

## 2021-08-26 DIAGNOSIS — K219 Gastro-esophageal reflux disease without esophagitis: Secondary | ICD-10-CM

## 2021-08-26 MED ORDER — PANTOPRAZOLE SODIUM 40 MG PO TBEC: 40.0000 mg | DELAYED_RELEASE_TABLET | Freq: Every day | ORAL | 3 refills | Status: DC

## 2021-08-26 NOTE — Telephone Encounter (Signed)
Patient requested refill

## 2021-08-27 ENCOUNTER — Other Ambulatory Visit: Payer: Self-pay

## 2021-08-27 ENCOUNTER — Ambulatory Visit (INDEPENDENT_AMBULATORY_CARE_PROVIDER_SITE_OTHER): Payer: Medicare Other | Admitting: Podiatry

## 2021-08-27 ENCOUNTER — Encounter: Payer: Self-pay | Admitting: Podiatry

## 2021-08-27 DIAGNOSIS — E119 Type 2 diabetes mellitus without complications: Secondary | ICD-10-CM

## 2021-08-27 DIAGNOSIS — M2142 Flat foot [pes planus] (acquired), left foot: Secondary | ICD-10-CM | POA: Diagnosis not present

## 2021-08-27 DIAGNOSIS — M25473 Effusion, unspecified ankle: Secondary | ICD-10-CM

## 2021-08-27 DIAGNOSIS — B351 Tinea unguium: Secondary | ICD-10-CM

## 2021-08-27 DIAGNOSIS — E1142 Type 2 diabetes mellitus with diabetic polyneuropathy: Secondary | ICD-10-CM | POA: Diagnosis not present

## 2021-08-27 DIAGNOSIS — M79675 Pain in left toe(s): Secondary | ICD-10-CM

## 2021-08-27 DIAGNOSIS — M79674 Pain in right toe(s): Secondary | ICD-10-CM

## 2021-08-27 DIAGNOSIS — M2141 Flat foot [pes planus] (acquired), right foot: Secondary | ICD-10-CM | POA: Diagnosis not present

## 2021-08-27 NOTE — Progress Notes (Signed)
ANNUAL DIABETIC FOOT EXAM  Subjective: Adrian Matthews presents today for for annual diabetic foot examination, at risk foot care with history of diabetic neuropathy, and thick, elongated toenails both feet which are tender when wearing enclosed shoe gear..  Patient relates 17 year h/o diabetes.  Patient denies any h/o foot wounds.  Patient has been diagnosed with neuropathy and it is managed with gabapentin.  Patient's blood sugar was 132 mg/dl today.   Wardell Honour, MD is patient's PCP. Last visit was 07/03/2021.  Past Medical History:  Diagnosis Date   Asymptomatic varicose veins    Chronic kidney disease    Diaphragmatic hernia without mention of obstruction or gangrene    DM (diabetes mellitus) ([place])    GERD (gastroesophageal reflux disease)    Gout, unspecified    Hypercalcemia    Hyperlipidemia    Hyperosmolality and/or hypernatremia    Hypertension    Monoclonal paraproteinemia    New daily persistent headache    Osteoarthrosis, unspecified whether generalized or localized, lower leg    Other malaise and fatigue    Primary localized osteoarthrosis, unspecified site    Unspecified disorder of kidney and ureter    Unspecified hereditary and idiopathic peripheral neuropathy    Patient Active Problem List   Diagnosis Date Noted   Gastroesophageal reflux disease 06/26/2021   General medical examination for administrative purposes 06/26/2021   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) 01/06/2019   Benign neoplasm of pituitary gland ([place]) 11/24/2018   Hyperlipidemia 11/24/2018   Iron deficiency anemia 08/30/2018   Generalized osteoarthritis of multiple sites 08/30/2018   Secondary hyperparathyroidism of renal origin ([place]) 08/03/2018   Chronic kidney disease, stage 3  ([place]) 12/17/2017   Long term (current) use of insulin ([place]) 11/13/2017   Testicular hypofunction 11/13/2017   Testosterone deficiency in male 08/10/2017   Pituitary microadenoma with hyperprolactinemia ([place]) 01/02/2017   Hormone deficiency 01/02/2017   At risk for obstructive sleep apnea 09/25/2016   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   Benign neoplasm of colon 04/24/2016   Diabetic renal disease ([place]) 04/24/2016   Morning headache 02/11/2016   Arthritis 10/27/2014   Class 3 severe obesity with serious comorbidity and body mass index (BMI) of 50.0 to 59.9 in adult [facility]) 06/04/2013   Rectal bleeding 06/04/2013   Osteoarthrosis, unspecified whether generalized or localized, lower leg    Monoclonal paraproteinemia    Gout, unspecified    Type 2 diabetes mellitus with stage 3 chronic kidney disease, with long-term current use of insulin ([place]) 02/28/2013   Hyperlipidemia associated with type 2 diabetes mellitus ([place]) 02/28/2013   Essential hypertension 02/28/2013   Unspecified disorder of kidney and ureter 02/28/2013   Past Surgical History:  Procedure Laterality Date   BIOPSY  10/19/2018   Procedure: BIOPSY;  Surgeon: Ronald Lobo, MD;  Location: [facility] ENDOSCOPY;  Service: Endoscopy;;   CARPAL TUNNEL RELEASE  2007   COLONOSCOPY N/A 06/06/2013   Procedure: COLONOSCOPY;  Surgeon: Cleotis Nipper, MD;  Location[facility]COPY;  Service: Endoscopy;  Laterality: N/A;   COLONOSCOPY WITH PROPOFOL N/A 10/19/2018   Procedure: COLONOSCOPY WITH PROPOFOL;  Surgeon: Ronald Lobo, MD;  Location: [facility] ENDOSCOPY;  Service: Endoscopy;  Laterality: N/A;   POLYPECTOMY  10/19/2018   Procedure: POLYPECTOMY;  Surgeon: Ronald Lobo, MD;  Location: [facility] ENDOSCOPY;  Service: Endoscopy;;   TUMOR REMOVAL  2017   Pituitary adenoma   Current Outpatient Medications on File Prior to Visit  Medication Sig Dispense Refill   allopurinol (ZYLOPRIM) 100 MG tablet TAKE 1 TABLET DAILY 90 tablet 2   amLODipine (NORVASC)  10 MG tablet Take 1 tablet (10 mg total) by mouth daily. for blood pressure 90 tablet 3   aspirin EC 81 MG tablet Take 1 tablet (81 mg total) by mouth daily. 30 tablet 11   atorvastatin (LIPITOR) 40 MG tablet TAKE 1 TABLET DAILY 90 tablet 3   chlorthalidone (HYGROTON) 25 MG tablet Take 1 tablet (25 mg total) by mouth daily. for blood pressure 90 tablet 1   Cholecalciferol (VITAMIN D3) 2000 units TABS Take 2,000 Units by mouth daily.      FREESTYLE LITE test strip CHECK BLOOD SUGAR THREE TIMES A DAY 300 strip 3   gabapentin (NEURONTIN) 300 MG capsule Take 1 capsule (300 mg total) by mouth daily. 90 capsule 1   insulin glargine (LANTUS SOLOSTAR) 100 UNIT/ML Solostar Pen Inject 66 units under the skin daily. 60 mL 3   insulin lispro (HUMALOG KWIKPEN) 100 UNIT/ML KwikPen INJECT 22 UNITS BEFORE BREAKFAST, 26 UNITS BEFORE LUNCH, 26 UNITS BEFORE SUPPER PLUS SLIDING SCALE. 75 mL 3   Insulin Pen Needle (SURE COMFORT PEN NEEDLES) 32G X 4 MM MISC Use 4 times daily before meals DX: E11.65 500 each 1   irbesartan (AVAPRO) 300 MG tablet Take 1 tablet (300 mg total) by mouth daily. 90 tablet 1   Omega-3 Fatty Acids (FISH OIL) 1200 MG CAPS Take 1,200 mg by mouth daily.      pantoprazole (PROTONIX) 40 MG tablet Take 1 tablet (40 mg total) by mouth daily. 90 tablet 3   potassium chloride SA (KLOR-CON) 20 MEQ tablet Take 1 tablet (20 mEq total) by mouth 2 (two) times daily. 180 tablet 1   traMADol (ULTRAM) 50 MG tablet TAKE 1 TABLET EVERY 8 HOURS AS NEEDED FOR UP TO 5 DAYS 15 tablet 0   No current facility-administered medications on file prior to visit.    No Known Allergies Social History   Occupational History   Occupation: Retired   Tobacco Use   Smoking status: Never   Smokeless tobacco: Never  Vaping Use   Vaping Use: Never used  Substance and Sexual Activity   Alcohol use: No    Alcohol/week: 0.0 standard drinks   Drug use: No   Sexual activity: Not Currently   Family History  Problem Relation  Age of Onset   Hypertension Sister    Hypertension Brother    Diabetes Brother    Hypertension Brother    Hypertension Sister    Migraines Neg Hx    Immunization History  Administered Date(s) Administered   Fluad Quad(high Dose 65+) 07/20/2019, 07/11/2020   Influenza, High Dose Seasonal PF 07/25/2016, 07/09/2017, 08/26/2018   Influenza, Quadrivalent, Recombinant, Inj, Pf 06/24/2021   Influenza,inj,Quad PF,6+ Mos 07/04/2013   Influenza-Unspecified 07/13/2014, 07/28/2015   PFIZER(Purple Top)SARS-COV-2 Vaccination 12/11/2019, 01/10/2020, 07/28/2020   Pneumococcal Conjugate-13 08/17/2014   Pneumococcal Polysaccharide-23 08/08/2013   Tdap 04/12/2013   Zoster, Live 08/08/2013    Review of Systems: Negative except as noted in the HPI.   Objective: There were no vitals filed for this visit.  Adrian Matthews is a pleasant 75 y.o. male in NAD. AAO X 3.  Vascular Examination: Capillary refill time to digits immediate b/l. Palpable DP pulse(s) b/l LE. Palpable PT pulse(s) b/l LE. Pedal hair  present. No pain with calf compression b/l. Nonpitting edema noted bilateral ankles. No cyanosis or clubbing noted b/l LE.  Dermatological Examination: Pedal integument with normal turgor, texture and tone BLE. No open wounds b/l LE. No interdigital macerations noted b/l LE. Toenails 1-5 b/l elongated, discolored, dystrophic, thickened, crumbly with subungual debris and tenderness to dorsal palpation. No hyperkeratotic nor porokeratotic lesions present on today's visit.  Musculoskeletal Examination: Normal muscle strength 5/5 to all lower extremity muscle groups bilaterally. Pes planus deformity noted bilateral LE.  Footwear Assessment: Does the patient wear appropriate shoes? Yes.. Does the patient need inserts/orthotics? Yes.  Neurological Examination: Pt has subjective symptoms of neuropathy. Protective sensation intact 5/5 intact bilaterally with 10g monofilament b/l. Vibratory sensation intact  b/l. Proprioception intact bilaterally.  Hemoglobin A1C Latest Ref Rng & Units 02/13/2021 10/16/2020  HGBA1C <5.7 % of total Hgb 7.5(H) 7.7(H)  Some recent data might be hidden   Assessment: 1. Pain due to onychomycosis of toenails of both feet   2. Mild ankle edema   3. Pes planus of both feet   4. Diabetic peripheral neuropathy associated with type 2 diabetes mellitus ([place])   5. Encounter for diabetic foot exam ([place])      ADA Risk Categorization: Low Risk :  Patient has all of the following: Intact protective sensation No prior foot ulcer  No severe deformity Pedal pulses present  Plan: -Examined patient. -Diabetic foot examination performed today. -Discussed diabetic foot care principles. Literature dispensed on today. -Continue diabetic foot care principles: inspect feet daily, monitor glucose as recommended by PCP and/or Endocrinologist, and follow prescribed diet per PCP, Endocrinologist and/or dietician. -Patient to continue soft, supportive shoe gear daily. Start procedure for diabetic shoes. Patient qualifies based on diagnoses. -Mycotic toenails 1-5 bilaterally were debrided in length and girth with sterile nail nippers and dremel without incident. -Patient/POA to call should there be question/concern in the interim.  Return in about 9 weeks (around 10/29/2021).  Marzetta Board, DPM

## 2021-09-04 ENCOUNTER — Other Ambulatory Visit: Payer: Self-pay | Admitting: Family Medicine

## 2021-09-04 DIAGNOSIS — I10 Essential (primary) hypertension: Secondary | ICD-10-CM

## 2021-09-07 ENCOUNTER — Other Ambulatory Visit: Payer: Self-pay | Admitting: Family Medicine

## 2021-09-09 NOTE — Telephone Encounter (Signed)
Patient has request refill on medication "Tramadol". Patient last refill on medication dated 08/21/2021. Noted on medication states up to 5 days. Patient doesn't have Non Opioid Contract on file. Patient next appointment schedule 10/29/2021. Update Contract added to patient appointment note. Patient medication pend and sent to Sherrie Mustache, NP due to PCP Sabra Heck Lillette Boxer, MD being out of office.

## 2021-09-12 ENCOUNTER — Other Ambulatory Visit: Payer: Self-pay | Admitting: Family Medicine

## 2021-10-10 ENCOUNTER — Ambulatory Visit (INDEPENDENT_AMBULATORY_CARE_PROVIDER_SITE_OTHER): Payer: Medicare Other | Admitting: Podiatry

## 2021-10-10 ENCOUNTER — Encounter: Payer: Self-pay | Admitting: Podiatry

## 2021-10-10 ENCOUNTER — Other Ambulatory Visit: Payer: Self-pay

## 2021-10-10 DIAGNOSIS — M7752 Other enthesopathy of left foot: Secondary | ICD-10-CM

## 2021-10-10 MED ORDER — TRIAMCINOLONE ACETONIDE 10 MG/ML IJ SUSP
10.0000 mg | Freq: Once | INTRAMUSCULAR | Status: AC
  Administered 2021-10-10: 11:00:00 10 mg

## 2021-10-10 NOTE — Progress Notes (Signed)
Subjective:   Patient ID: Adrian Matthews, male   DOB: 75 y.o.   MRN: [id number]   HPI Patient presents stating he is developed a lot of redness and pain around the big toe joint left and its been inflamed and hard for him to wear shoe gear with over the last few days   ROS      Objective:  Physical Exam  Neurovascular status intact quite a bit of swelling around the first MPJ left with fluid buildup and pain     Assessment:  Probability for gout of the first MPJ left with inflammation fluid buildup capsulitis around the first MPJ     Plan:  H&P reviewed condition sterile prep injected the capsule of the first MPJ 3 mg Kenalog 5 mg Xylocaine advised on anti-inflammatories and support.  Reappoint if symptoms indicate

## 2021-10-15 ENCOUNTER — Ambulatory Visit (INDEPENDENT_AMBULATORY_CARE_PROVIDER_SITE_OTHER): Payer: Medicare Other | Admitting: Podiatry

## 2021-10-15 ENCOUNTER — Ambulatory Visit: Payer: Medicare Other

## 2021-10-15 ENCOUNTER — Other Ambulatory Visit: Payer: Self-pay | Admitting: Podiatry

## 2021-10-15 ENCOUNTER — Ambulatory Visit (INDEPENDENT_AMBULATORY_CARE_PROVIDER_SITE_OTHER): Payer: Medicare Other

## 2021-10-15 ENCOUNTER — Other Ambulatory Visit: Payer: Self-pay

## 2021-10-15 DIAGNOSIS — M7752 Other enthesopathy of left foot: Secondary | ICD-10-CM

## 2021-10-15 DIAGNOSIS — M79671 Pain in right foot: Secondary | ICD-10-CM | POA: Diagnosis not present

## 2021-10-15 MED ORDER — METHYLPREDNISOLONE 4 MG PO TBPK: ORAL_TABLET | ORAL | 0 refills | Status: DC

## 2021-10-15 NOTE — Progress Notes (Signed)
°  Subjective:  Patient ID: Adrian Matthews, male    DOB: 1946/06/05,   MRN: [id number]  Chief Complaint  Patient presents with   Foot Pain    bil foot pain/swelling-unable to wear a shoe    76 y.o. male presents for concern of bilateral foot pain and swelling. Relates he was seen by Dr. Paulla Dolly last week and received an injection into the left foot. Relates the pain improved but the swelling has not improved. Patient relates the right foot is now more painful and unable to wear a shoe. Relates it has been affecting his gait.  . Denies any other pedal complaints. Denies n/v/f/c.   Past Medical History:  Diagnosis Date   Asymptomatic varicose veins    Chronic kidney disease    Diaphragmatic hernia without mention of obstruction or gangrene    DM (diabetes mellitus) (HCC)    GERD (gastroesophageal reflux disease)    Gout, unspecified    Hypercalcemia    Hyperlipidemia    Hyperosmolality and/or hypernatremia    Hypertension    Monoclonal paraproteinemia    New daily persistent headache    Osteoarthrosis, unspecified whether generalized or localized, lower leg    Other malaise and fatigue    Primary localized osteoarthrosis, unspecified site    Unspecified disorder of kidney and ureter    Unspecified hereditary and idiopathic peripheral neuropathy     Objective:  Physical Exam: Vascular: DP/PT pulses 2/4 bilateral. CFT <3 seconds. Normal hair growth on digits.   Skin. No lacerations or abrasions bilateral feet. Significant bilateral lower extremity edema.  Musculoskeletal: MMT 5/5 bilateral lower extremities in DF, PF, Inversion and Eversion. Deceased ROM in DF of ankle joint. Tender to left first metarsophalangeal joint. Pain with ROM. Mild erythema and edema.  Neurological: Sensation intact to light touch.   Assessment:   1. Capsulitis of metatarsophalangeal (MTP) joint of left foot   2. acute gout       Plan:  Patient was evaluated and treated and all questions  answered. -Xrays reviewed. No acute fractures or dislocations bilateral. Right first metatarsal head with mild erosions possibly related to gout.  -Discussed treatement options for gouty arthritis and gout education provided. -Deferred injection today.  -Discussed diet and modifications.  -Rx Medrol dose pack  -Surgical shoe provided.  -Advised patient to call if symptoms are not improved within 1 week -Patient to return in 3 weeks for re-check/further discussion for long term management of gout or sooner if condition worsens.   Lorenda Peck, DPM

## 2021-10-28 ENCOUNTER — Other Ambulatory Visit: Payer: Self-pay | Admitting: Family Medicine

## 2021-10-28 DIAGNOSIS — E876 Hypokalemia: Secondary | ICD-10-CM

## 2021-10-29 ENCOUNTER — Ambulatory Visit (INDEPENDENT_AMBULATORY_CARE_PROVIDER_SITE_OTHER): Payer: Medicare Other | Admitting: Family Medicine

## 2021-10-29 ENCOUNTER — Encounter: Payer: Self-pay | Admitting: Family Medicine

## 2021-10-29 ENCOUNTER — Other Ambulatory Visit: Payer: Self-pay

## 2021-10-29 VITALS — BP 140/88 | HR 88 | Temp 96.9°F | Ht 69.0 in | Wt 390.4 lb

## 2021-10-29 DIAGNOSIS — N1831 Chronic kidney disease, stage 3a: Secondary | ICD-10-CM | POA: Diagnosis not present

## 2021-10-29 DIAGNOSIS — Z6841 Body Mass Index (BMI) 40.0 and over, adult: Secondary | ICD-10-CM | POA: Diagnosis not present

## 2021-10-29 DIAGNOSIS — M1A0790 Idiopathic chronic gout, unspecified ankle and foot, without tophus (tophi): Secondary | ICD-10-CM | POA: Diagnosis not present

## 2021-10-29 DIAGNOSIS — I10 Essential (primary) hypertension: Secondary | ICD-10-CM

## 2021-10-29 DIAGNOSIS — E790 Hyperuricemia without signs of inflammatory arthritis and tophaceous disease: Secondary | ICD-10-CM | POA: Diagnosis not present

## 2021-10-29 DIAGNOSIS — K219 Gastro-esophageal reflux disease without esophagitis: Secondary | ICD-10-CM | POA: Diagnosis not present

## 2021-10-29 LAB — URIC ACID: Uric Acid, Serum: 8.6 mg/dL — ABNORMAL HIGH (ref 4.0–8.0)

## 2021-10-29 NOTE — Progress Notes (Signed)
Provider:  Alain Honey, MD  Careteam: Patient Care Team: Wardell Honour, MD as PCP - General (Family Medicine) Elmarie Shiley, MD as Consulting Physician (Nephrology) Sharyne Peach, MD as Consulting Physician (Ophthalmology) Ronald Lobo, MD as Consulting Physician (Gastroenterology)  PLACE OF SERVICE:  [place] Directive information    No Known Allergies  Chief Complaint  Patient presents with   Medical Management of Chronic Issues    Patient presents today for a 4 month follow up.   Quality Metric Gaps    Zoster,A1C, COVID booster     HPI: Patient is a 76 y.o. male .  Here for routine follow-up chronic medical problems including diabetes hypertension gout chronic kidney disease and obesity.  He is really doing pretty well.  He continues to exercise at least 3 times a week.  Preferred method of exercise is swimming.  He is followed by endocrine and kidney specialists and has appointments later this month with kidney.  I had intended to follow-up renal function by lab but defer to kidney specialist. He is also followed by podiatry.  Had a flareup of gout recently that was treated with steroid Dosepak.  Last flare of gout was about 1 year ago and he is on maintenance allopurinol 100 mg. Weight is down about 4 pounds since last visit.  Blood sugars are good when he checks them at home He has no new symptoms or complaints today We did discuss treatment as it relates to his pituitary tumor.  Apparently the pituitary was intact and he does not need pituitary hormones as he is not on any other replacement drugs such as thyroid or adrenal or testicular.  Review of Systems:  Review of Systems  Constitutional: Negative.   HENT: Negative.    Respiratory: Negative.    Cardiovascular: Negative.   Gastrointestinal: Negative.   Genitourinary: Negative.   Neurological: Negative.   Psychiatric/Behavioral: Negative.    All other systems reviewed and are  negative.  Past Medical History:  Diagnosis Date   Asymptomatic varicose veins    Chronic kidney disease    Diaphragmatic hernia without mention of obstruction or gangrene    DM (diabetes mellitus) (H[facility])    GERD (gastroesophageal reflux disease)    Gout, unspecified    Hypercalcemia    Hyperlipidemia    Hyperosmolality and/or hypernatremia    Hypertension    Monoclonal paraproteinemia    New daily persistent headache    Osteoarthrosis, unspecified whether generalized or localized, lower leg    Other malaise and fatigue    Primary localized osteoarthrosis, unspecified site    Unspecified disorder of kidney and ureter    Unspecified hereditary and idiopathic peripheral neuropathy    Past Surgical History:  Procedure Laterality Date   BIOPSY  10/19/2018   Procedure: BIOPSY;  Surgeon: Ronald Lobo, MD;  [facility] END[facility];  Service: Endoscopy;;   CARPAL TUNNEL RELEASE  2007   COLONOSCOPY N/A 06/06/2013   Procedure: COLONOSCOPY;  Surgeon: Cleotis Nipper, MD;  Location: [facility] ENDOSCOPY;  Service: Endoscopy;  Laterality: N/A;   COLONOSCOPY WITH PROPOFOL N/A 10/19/2018   Procedure: COLONOSCOPY WITH PROPOFOL;  Surgeon: Ronald Lobo, MD;  Location: [facility] ENDOSCOPY;  Service: Endoscopy;  Laterality: N/A;   POLYPECTOMY  10/19/2018   Procedure: POLYPECTOMY;  Surgeon: Ronald Lobo, MD;  Location: [facility] ENDOSCOPY;  Service: Endoscopy;;   TUMOR REMOVAL  2017   Pituitary adenoma   Social History:   reports that he has never smoked. He has never used  smokeless tobacco. He reports that he does not drink alcohol and does not use drugs.  Family History  Problem Relation Age of Onset   Hypertension Sister    Hypertension Brother    Diabetes Brother    Hypertension Brother    Hypertension Sister    Migraines Neg Hx     Medications: Patient's Medications  New Prescriptions   No medications on file  Previous Medications   ALLOPURINOL (ZYLOPRIM) 100 MG TABLET    TAKE 1 TABLET DAILY    AMLODIPINE (NORVASC) 10 MG TABLET    Take 1 tablet (10 mg total) by mouth daily. for blood pressure   ASPIRIN EC 81 MG TABLET    Take 1 tablet (81 mg total) by mouth daily.   ATORVASTATIN (LIPITOR) 40 MG TABLET    TAKE 1 TABLET DAILY   CHLORTHALIDONE (HYGROTON) 25 MG TABLET    TAKE 1 TABLET DAILY FOR BLOOD PRESSURE   CHOLECALCIFEROL (VITAMIN D3) 2000 UNITS TABS    Take 2,000 Units by mouth daily.    FREESTYLE LITE TEST STRIP    CHECK BLOOD SUGAR THREE TIMES A DAY   GABAPENTIN (NEURONTIN) 300 MG CAPSULE    Take 1 capsule (300 mg total) by mouth daily.   INSULIN GLARGINE (LANTUS SOLOSTAR) 100 UNIT/ML SOLOSTAR PEN    Inject 66 units under the skin daily.   INSULIN LISPRO (HUMALOG KWIKPEN) 100 UNIT/ML KWIKPEN    INJECT 22 UNITS BEFORE BREAKFAST, 26 UNITS BEFORE LUNCH, 26 UNITS BEFORE SUPPER PLUS SLIDING SCALE.   INSULIN PEN NEEDLE (SURE COMFORT PEN NEEDLES) 32G X 4 MM MISC    Use 4 times daily before meals DX: E11.65   IRBESARTAN (AVAPRO) 300 MG TABLET    TAKE 1 TABLET DAILY   METHYLPREDNISOLONE (MEDROL DOSEPAK) 4 MG TBPK TABLET    Take as directed   OMEGA-3 FATTY ACIDS (FISH OIL) 1200 MG CAPS    Take 1,200 mg by mouth daily.    PANTOPRAZOLE (PROTONIX) 40 MG TABLET    Take 1 tablet (40 mg total) by mouth daily.   POTASSIUM CHLORIDE SA (KLOR-CON M) 20 MEQ TABLET    TAKE 1 TABLET TWICE A DAY   TRAMADOL (ULTRAM) 50 MG TABLET    TAKE 1 TABLET EVERY 8 HOURS AS NEEDED FOR UP TO 5 DAYS  Modified Medications   No medications on file  Discontinued Medications   No medications on file    Physical Exam:  Vitals:   10/29/21 0817  BP: 140/88  Pulse: 88  Temp: (!) 96.9 F (36.1 C)  SpO2: 97%  Weight: (!) 390 lb 6.4 oz (177.1 kg)  Height: 5\' 9"  (1.753 m)   Body mass index is 57.65 kg/m. Wt Readings from Last 3 Encounters:  10/29/21 (!) 390 lb 6.4 oz (177.1 kg)  07/03/21 (!) 394 lb (178.7 kg)  02/20/21 (!) 397 lb 12.8 oz (180.4 kg)    Physical Exam Vitals and nursing note reviewed.   Constitutional:      Appearance: He is obese.  Cardiovascular:     Rate and Rhythm: Normal rate and regular rhythm.  Pulmonary:     Effort: Pulmonary effort is normal.     Breath sounds: Normal breath sounds.  Musculoskeletal:        General: Normal range of motion.  Neurological:     General: No focal deficit present.     Mental Status: He is alert and oriented to person, place, and time.  Psychiatric:  Mood and Affect: Mood normal.        Behavior: Behavior normal.    Labs reviewed: Basic Metabolic Panel: Recent Labs    02/13/21 0000  NA 138  K 3.6  CL 99  CO2 27  GLUCOSE 105*  BUN 20  CREATININE 1.48*  CALCIUM 10.0   Liver Function Tests: No results for input(s): AST, ALT, ALKPHOS, BILITOT, PROT, ALBUMIN in the last 8760 hours. No results for input(s): LIPASE, AMYLASE in the last 8760 hours. No results for input(s): AMMONIA in the last 8760 hours. CBC: Recent Labs    02/13/21 0000  WBC 9.9  NEUTROABS 4,930  HGB 12.1*  HCT 39.7  MCV 79.9*  PLT 251   Lipid Panel: Recent Labs    02/13/21 0000  CHOL 134  HDL 50  LDLCALC 68  TRIG 75  CHOLHDL 2.7   TSH: No results for input(s): TSH in the last 8760 hours. A1C: Lab Results  Component Value Date   HGBA1C 7.5 (H) 02/13/2021     Assessment/Plan  1. Gastroesophageal reflux disease without esophagitis Denies symptoms.  Continue with Protonix  2. Elevated uric acid in blood Since he had a flare.  We will check uric acid level to see if allopurinol needs adjusting  3. Stage 3a chronic kidney disease ([place]) Appointment on 24th with  4. Class 3 severe obesity with serious comorbidity and body mass index (BMI) of 50.0 to 59.9 in adult, unspecified obesity type ([place]) Continues to exercise (swim) and watch carbs.  Weight is down 4 pounds since last visit  5. Essential hypertension Blood pressure acceptable on amlodipine chlorthalidone and irbesartan  6. Idiopathic chronic gout of foot without  tophus, unspecified laterality Acute flare has resolved.  Checking uric acid level today   Alain Honey, MD [facility] [phone]

## 2021-10-29 NOTE — Patient Instructions (Signed)
No changes today from primary care perspective Be sure and keep appointments with kidney and endocrine specialists Keep swimming and working on your weight

## 2021-10-30 ENCOUNTER — Ambulatory Visit: Payer: Medicare Other

## 2021-10-30 ENCOUNTER — Ambulatory Visit (INDEPENDENT_AMBULATORY_CARE_PROVIDER_SITE_OTHER): Payer: Medicare Other | Admitting: Podiatry

## 2021-10-30 ENCOUNTER — Encounter: Payer: Self-pay | Admitting: Podiatry

## 2021-10-30 DIAGNOSIS — M79675 Pain in left toe(s): Secondary | ICD-10-CM | POA: Diagnosis not present

## 2021-10-30 DIAGNOSIS — N1831 Chronic kidney disease, stage 3a: Secondary | ICD-10-CM | POA: Diagnosis not present

## 2021-10-30 DIAGNOSIS — M2141 Flat foot [pes planus] (acquired), right foot: Secondary | ICD-10-CM

## 2021-10-30 DIAGNOSIS — E1142 Type 2 diabetes mellitus with diabetic polyneuropathy: Secondary | ICD-10-CM

## 2021-10-30 DIAGNOSIS — M79674 Pain in right toe(s): Secondary | ICD-10-CM

## 2021-10-30 DIAGNOSIS — B351 Tinea unguium: Secondary | ICD-10-CM

## 2021-10-30 NOTE — Progress Notes (Signed)
SITUATION Reason for Consult: Evaluation for Prefabricated Diabetic Shoes and Bilateral Custom Diabetic Inserts. Patient / Caregiver Report: Patient would like well fitting shoes  OBJECTIVE DATA: Patient History / Diagnosis:    ICD-10-CM   1. Diabetic peripheral neuropathy associated with type 2 diabetes mellitus (HCC)  E11.42     2. Pes planus of both feet  M21.41    M21.42       Current or Previous Devices:   Apex X801M  In-Person Foot Examination: Ulcers & Callousing:   None  Toe / Foot Deformities:   - Pes Planus    Shoe Size: 11XW  ORTHOTIC RECOMMENDATION Recommended Devices: - 1x pair prefabricated PDAC approved diabetic shoes: Apex hiking shoe brown 11XW - 3x pair custom-to-patient vacuum formed diabetic insoles.   GOALS OF SHOES AND INSOLES - Reduce shear and pressure - Reduce / Prevent callus formation - Reduce / Prevent ulceration - Protect the fragile healing compromised diabetic foot.  Patient would benefit from diabetic shoes and inserts as patient has diabetes mellitus and the patient has one or more of the following conditions: - Peripheral neuropathy with evidence of callus formation - Foot deformity - Poor circulation  ACTIONS PERFORMED Patient was casted for insoles via crush box and measured for shoes via brannock device. Procedure was explained and patient tolerated procedure well. All questions were answered and concerns addressed.  PLAN Patient is to ensure treating physician receives and completes diabetic paperwork. Casts and shoe order are to be held until paperwork is received. Once received patient is to be scheduled for fitting in four weeks.

## 2021-10-30 NOTE — Progress Notes (Signed)
°  Subjective:  Patient ID: Adrian Matthews, male    DOB: 04/26/1946,  MRN: [id number]  Yong Grieser presents to clinic today for at risk foot care with history of diabetic neuropathy and painful elongated mycotic toenails 1-5 bilaterally which are tender when wearing enclosed shoe gear. Pain is relieved with periodic professional debridement.  Patient states gout is 100% better since taking the Medrol dosepak prescribed by Dr. Blenda Mounts. He notes no new pedal problems on today's visit.  PCP is Wardell Honour, MD , and last visit was 10/29/2020.  No Known Allergies  Review of Systems: Negative except as noted in the HPI. Objective:   Constitutional Adrian Matthews is a pleasant 76 y.o. African American male, morbidly obese in NAD. AAO x 3.   Vascular CFT immediate b/l LE. Palpable DP/PT pulses b/l LE. Digital hair present b/l. Skin temperature gradient WNL b/l. No pain with calf compression b/l. No edema noted b/l. No cyanosis or clubbing noted b/l LE.  Neurologic Normal speech. Oriented to person, place, and time. Pt has subjective symptoms of neuropathy. Protective sensation intact 5/5 intact bilaterally with 10g monofilament b/l. Vibratory sensation intact b/l.  Dermatologic Pedal integument with normal turgor, texture and tone b/l LE. No open wounds b/l. No interdigital macerations b/l. Toenails 1-5 b/l elongated, thickened, discolored with subungual debris. +Tenderness with dorsal palpation of nailplates. No hyperkeratotic or porokeratotic lesions present.  Orthopedic: Normal muscle strength 5/5 to all lower extremity muscle groups bilaterally. Pes planus deformity noted bilateral LE.Marland Kitchen No pain, crepitus or joint limitation noted with ROM b/l LE.  Patient ambulates independently without assistive aids.   Radiographs: None  Last A1c:  Hemoglobin A1C Latest Ref Rng & Units 02/13/2021  HGBA1C <5.7 % of total Hgb 7.5(H)  Some recent data might be hidden   Assessment:   1. Pain due to  onychomycosis of toenails of both feet   2. Diabetic peripheral neuropathy associated with type 2 diabetes mellitus ([place])    Plan:  Patient was evaluated and treated and all questions answered. Consent given for treatment as described below: -Continue foot and shoe inspections daily. Monitor blood glucose per PCP/Endocrinologist's recommendations. -Toenails 1-5 b/l were debrided in length and girth with sterile nail nippers and dremel without iatrogenic bleeding.  -Patient/POA to call should there be question/concern in the interim.  Return in about 3 months (around 01/28/2022).  Marzetta Board, DPM

## 2021-10-31 ENCOUNTER — Other Ambulatory Visit: Payer: Self-pay

## 2021-10-31 MED ORDER — ALLOPURINOL 100 MG PO TABS: 100.0000 mg | ORAL_TABLET | Freq: Two times a day (BID) | ORAL | 3 refills | Status: DC

## 2021-11-04 ENCOUNTER — Telehealth: Payer: Self-pay | Admitting: Podiatry

## 2021-11-04 NOTE — Telephone Encounter (Signed)
Pt left message last Friday afternoon stating his pcp was faxing the diabetic shoe paperwork..  I returned call after checking and explained that we only got one page of the documents needed and we have faxed the other page to be signed.

## 2021-11-05 DIAGNOSIS — I129 Hypertensive chronic kidney disease with stage 1 through stage 4 chronic kidney disease, or unspecified chronic kidney disease: Secondary | ICD-10-CM | POA: Diagnosis not present

## 2021-11-05 DIAGNOSIS — N2581 Secondary hyperparathyroidism of renal origin: Secondary | ICD-10-CM | POA: Diagnosis not present

## 2021-11-05 DIAGNOSIS — N1831 Chronic kidney disease, stage 3a: Secondary | ICD-10-CM | POA: Diagnosis not present

## 2021-11-05 DIAGNOSIS — D631 Anemia in chronic kidney disease: Secondary | ICD-10-CM | POA: Diagnosis not present

## 2021-11-08 ENCOUNTER — Other Ambulatory Visit: Payer: TRICARE For Life (TFL)

## 2021-11-08 ENCOUNTER — Other Ambulatory Visit: Payer: Self-pay | Admitting: Family Medicine

## 2021-11-11 ENCOUNTER — Ambulatory Visit: Payer: Medicare Other | Admitting: Podiatry

## 2021-11-18 DIAGNOSIS — H25813 Combined forms of age-related cataract, bilateral: Secondary | ICD-10-CM | POA: Diagnosis not present

## 2021-11-18 DIAGNOSIS — H10411 Chronic giant papillary conjunctivitis, right eye: Secondary | ICD-10-CM | POA: Diagnosis not present

## 2021-11-18 DIAGNOSIS — H0011 Chalazion right upper eyelid: Secondary | ICD-10-CM | POA: Diagnosis not present

## 2021-12-05 ENCOUNTER — Other Ambulatory Visit: Payer: Self-pay | Admitting: *Deleted

## 2021-12-05 DIAGNOSIS — N182 Chronic kidney disease, stage 2 (mild): Secondary | ICD-10-CM

## 2021-12-05 DIAGNOSIS — E0822 Diabetes mellitus due to underlying condition with diabetic chronic kidney disease: Secondary | ICD-10-CM

## 2021-12-05 MED ORDER — LANTUS SOLOSTAR 100 UNIT/ML ~~LOC~~ SOPN: PEN_INJECTOR | SUBCUTANEOUS | 3 refills | Status: DC

## 2021-12-05 NOTE — Telephone Encounter (Signed)
Patient called requesting refills.  Tramadol LR: 08/21/2021, patient states he uses for his Arthritis pains and requesting refill.   Pended Rx's and sent to [facility] for approval due to Dr. Sabra Heck out of office.

## 2021-12-05 NOTE — Telephone Encounter (Signed)
Last refill said to use up to 5 days. He will need to be seen or msg sent to Dr Sabra Heck for approval when he is back in office to clarify as I can not refill this at this time because it appears it was for short term use only and not long term refills.

## 2021-12-11 ENCOUNTER — Other Ambulatory Visit: Payer: Self-pay | Admitting: Family Medicine

## 2021-12-12 NOTE — Telephone Encounter (Signed)
Patient will discuss the Tramadol at next appointment.  ?

## 2021-12-13 DIAGNOSIS — H01005 Unspecified blepharitis left lower eyelid: Secondary | ICD-10-CM | POA: Diagnosis not present

## 2021-12-13 DIAGNOSIS — H0011 Chalazion right upper eyelid: Secondary | ICD-10-CM | POA: Diagnosis not present

## 2021-12-13 DIAGNOSIS — H0012 Chalazion right lower eyelid: Secondary | ICD-10-CM | POA: Diagnosis not present

## 2021-12-13 DIAGNOSIS — H01002 Unspecified blepharitis right lower eyelid: Secondary | ICD-10-CM | POA: Diagnosis not present

## 2021-12-24 DIAGNOSIS — E1129 Type 2 diabetes mellitus with other diabetic kidney complication: Secondary | ICD-10-CM | POA: Diagnosis not present

## 2021-12-24 DIAGNOSIS — Z794 Long term (current) use of insulin: Secondary | ICD-10-CM | POA: Diagnosis not present

## 2021-12-24 DIAGNOSIS — D352 Benign neoplasm of pituitary gland: Secondary | ICD-10-CM | POA: Diagnosis not present

## 2021-12-24 DIAGNOSIS — N1831 Chronic kidney disease, stage 3a: Secondary | ICD-10-CM | POA: Diagnosis not present

## 2021-12-24 DIAGNOSIS — M1A30X0 Chronic gout due to renal impairment, unspecified site, without tophus (tophi): Secondary | ICD-10-CM | POA: Diagnosis not present

## 2021-12-24 DIAGNOSIS — E221 Hyperprolactinemia: Secondary | ICD-10-CM | POA: Diagnosis not present

## 2021-12-24 DIAGNOSIS — E291 Testicular hypofunction: Secondary | ICD-10-CM | POA: Diagnosis not present

## 2021-12-24 DIAGNOSIS — I10 Essential (primary) hypertension: Secondary | ICD-10-CM | POA: Diagnosis not present

## 2021-12-24 DIAGNOSIS — D126 Benign neoplasm of colon, unspecified: Secondary | ICD-10-CM | POA: Diagnosis not present

## 2022-01-07 ENCOUNTER — Encounter: Payer: Self-pay | Admitting: Podiatry

## 2022-01-07 ENCOUNTER — Ambulatory Visit (INDEPENDENT_AMBULATORY_CARE_PROVIDER_SITE_OTHER): Payer: Medicare Other | Admitting: Podiatry

## 2022-01-07 ENCOUNTER — Other Ambulatory Visit: Payer: Self-pay

## 2022-01-07 ENCOUNTER — Ambulatory Visit (INDEPENDENT_AMBULATORY_CARE_PROVIDER_SITE_OTHER): Payer: Medicare Other

## 2022-01-07 DIAGNOSIS — E1142 Type 2 diabetes mellitus with diabetic polyneuropathy: Secondary | ICD-10-CM | POA: Diagnosis not present

## 2022-01-07 DIAGNOSIS — M2142 Flat foot [pes planus] (acquired), left foot: Secondary | ICD-10-CM

## 2022-01-07 DIAGNOSIS — M79674 Pain in right toe(s): Secondary | ICD-10-CM

## 2022-01-07 DIAGNOSIS — M2141 Flat foot [pes planus] (acquired), right foot: Secondary | ICD-10-CM | POA: Diagnosis not present

## 2022-01-07 DIAGNOSIS — M79675 Pain in left toe(s): Secondary | ICD-10-CM

## 2022-01-07 DIAGNOSIS — M1A00X0 Idiopathic chronic gout, unspecified site, without tophus (tophi): Secondary | ICD-10-CM | POA: Insufficient documentation

## 2022-01-07 DIAGNOSIS — B351 Tinea unguium: Secondary | ICD-10-CM

## 2022-01-07 NOTE — Progress Notes (Signed)
SITUATION ?Reason for Visit: Fitting of Diabetic [place] ?Patient / Caregiver Report:  Patient is satisfied with fit and function of shoes and insoles. ? ?OBJECTIVE DATA: ?Patient History / Diagnosis:   ?  ICD-10-CM   ?1. Diabetic peripheral neuropathy associated with type 2 diabetes mellitus ([place])  E11.42   ?  ?2. Pes planus of both feet  M21.41   ? M21.42   ?  ? ? ?Change in Status:   None ? ?ACTIONS PERFORMED: ?In-Person Delivery, patient was fit with: ?- 1x pair [id number] PDAC approved prefabricated Diabetic Shoes: Apex hiking shoe brown 11XW ?- 3x pair T0626 PDAC approved vacuum formed custom diabetic insoles; [employer]: [id number] ? ?Shoes and insoles were verified for structural integrity and safety. Patient wore shoes and insoles in office. Skin was inspected and free of areas of concern after wearing shoes and inserts. Shoes and inserts fit properly. Patient / Caregiver provided with ferbal instruction and demonstration regarding donning, doffing, wear, care, proper fit, function, purpose, cleaning, and use of shoes and insoles ' and in all related precautions and risks and benefits regarding shoes and insoles. Patient / Caregiver was instructed to wear properly fitting socks with shoes at all times. Patient was also provided with verbal instruction regarding how to report any failures or malfunctions of shoes or inserts, and necessary follow up care. Patient / Caregiver was also instructed to contact physician regarding change in status that may affect function of shoes and inserts.  ? ?Patient / Caregiver verbalized undersatnding of instruction provided. Patient / Caregiver demonstrated independence with proper donning and doffing of shoes and inserts. ? ?PLAN ?Patient to follow with treating physician as recommended. Plan of care was discussed with and agreed upon by patient and/or caregiver. All questions were answered and concerns addressed. ? ?

## 2022-01-07 NOTE — Progress Notes (Signed)
?  Subjective:  ?Patient ID: Adrian Matthews, male    DOB: 1946/08/27,  MRN: [id number] ? ?Adrian Matthews presents to clinic today for at risk foot care with history of diabetic neuropathy and painful thick toenails that are difficult to trim. Pain interferes with ambulation. Aggravating factors include wearing enclosed shoe gear. Pain is relieved with periodic professional debridement. ? ?Patient states blood glucose was 135 mg/dl today.  Last HgA1c was 7.1% per patient recall. ? ?New problem(s): None.  ? ?PCP is Wardell Honour, MD , and last visit was July 03, 2021. ? ?No Known Allergies ? ?Review of Systems: Negative except as noted in the HPI. ? ?Objective: No changes noted in today's physical examination. ? ?Constitutional Adrian Matthews is a pleasant 76 y.o. African American male, morbidly obese in NAD. AAO x 3.   ?Vascular CFT immediate b/l LE. Palpable DP/PT pulses b/l LE. Digital hair present b/l. Skin temperature gradient WNL b/l. No pain with calf compression b/l. No edema noted b/l. No cyanosis or clubbing noted b/l LE.  ?Neurologic Normal speech. Oriented to person, place, and time. Pt has subjective symptoms of neuropathy. Protective sensation intact 5/5 intact bilaterally with 10g monofilament b/l. Vibratory sensation intact b/l.  ?Dermatologic Pedal integument with normal turgor, texture and tone b/l LE. No open wounds b/l. No interdigital macerations b/l. Toenails 1-5 b/l elongated, thickened, discolored with subungual debris. +Tenderness with dorsal palpation of nailplates. No hyperkeratotic or porokeratotic lesions present.  ?Orthopedic: Normal muscle strength 5/5 to all lower extremity muscle groups bilaterally. Pes planus deformity noted bilateral LE.Marland Kitchen No pain, crepitus or joint limitation noted with ROM b/l LE.  Patient ambulates independently without assistive aids.  ? ?Radiographs: None ? ?  Latest Ref Rng & Units 02/13/2021  ? 12:00 AM  ?Hemoglobin A1C  ?Hemoglobin-A1c <5.7 % of total Hgb  7.5    ? ?Assessment/Plan: ?1. Pain due to onychomycosis of toenails of both feet   ?2. Diabetic peripheral neuropathy associated with type 2 diabetes mellitus ([place])   ?-No new findings. No new orders. ?-Patient to continue soft, supportive shoe gear daily. ?-Mycotic toenails 1-5 bilaterally were debrided in length and girth with sterile nail nippers and dremel without incident. ?-Patient/POA to call should there be question/concern in the interim.  ? ?Return in about 3 months (around 04/09/2022). ? ?Marzetta Board, DPM  ?

## 2022-02-07 ENCOUNTER — Other Ambulatory Visit: Payer: Self-pay | Admitting: Family Medicine

## 2022-02-24 ENCOUNTER — Other Ambulatory Visit: Payer: Self-pay | Admitting: *Deleted

## 2022-02-24 MED ORDER — ATORVASTATIN CALCIUM 40 MG PO TABS: 40.0000 mg | ORAL_TABLET | Freq: Every day | ORAL | 3 refills | Status: DC

## 2022-02-24 NOTE — Telephone Encounter (Signed)
Patient requested refill

## 2022-03-04 ENCOUNTER — Ambulatory Visit: Admitting: Family Medicine

## 2022-03-05 ENCOUNTER — Encounter: Payer: Self-pay | Admitting: Family Medicine

## 2022-03-05 ENCOUNTER — Ambulatory Visit (INDEPENDENT_AMBULATORY_CARE_PROVIDER_SITE_OTHER): Payer: Medicare Other | Admitting: Family Medicine

## 2022-03-05 ENCOUNTER — Other Ambulatory Visit: Payer: Self-pay | Admitting: *Deleted

## 2022-03-05 VITALS — BP 138/88 | HR 90 | Temp 98.4°F | Ht 69.0 in | Wt >= 6400 oz

## 2022-03-05 DIAGNOSIS — N1832 Chronic kidney disease, stage 3b: Secondary | ICD-10-CM | POA: Diagnosis not present

## 2022-03-05 DIAGNOSIS — N1831 Chronic kidney disease, stage 3a: Secondary | ICD-10-CM

## 2022-03-05 DIAGNOSIS — E1121 Type 2 diabetes mellitus with diabetic nephropathy: Secondary | ICD-10-CM | POA: Diagnosis not present

## 2022-03-05 DIAGNOSIS — E1122 Type 2 diabetes mellitus with diabetic chronic kidney disease: Secondary | ICD-10-CM

## 2022-03-05 DIAGNOSIS — Z794 Long term (current) use of insulin: Secondary | ICD-10-CM | POA: Diagnosis not present

## 2022-03-05 DIAGNOSIS — E785 Hyperlipidemia, unspecified: Secondary | ICD-10-CM

## 2022-03-05 DIAGNOSIS — Z6841 Body Mass Index (BMI) 40.0 and over, adult: Secondary | ICD-10-CM

## 2022-03-05 DIAGNOSIS — E1169 Type 2 diabetes mellitus with other specified complication: Secondary | ICD-10-CM

## 2022-03-05 MED ORDER — ALLOPURINOL 100 MG PO TABS: 100.0000 mg | ORAL_TABLET | Freq: Two times a day (BID) | ORAL | 3 refills | Status: DC

## 2022-03-05 NOTE — Progress Notes (Signed)
Provider:  Alain Honey, MD  Careteam: Patient Care Team: Wardell Honour, MD as PCP - General (Family Medicine) Elmarie Shiley, MD as Consulting Physician (Nephrology) Sharyne Peach, MD as Consulting Physician (Ophthalmology) Ronald Lobo, MD as Consulting Physician (Gastroenterology)  PLACE OF SERVICE:  [place] Directive information    No Known Allergies  Chief Complaint  Patient presents with   Medical Management of Chronic Issues    Patient presents today for a 4 month follow-up   Quality Metric Gaps    A1C, zoster, COVID booster #4     HPI: Patient is a 76 y.o. male patient is here to follow-up chronic medical problems including diabetes hypertension hyperlipidemia chronic kidney disease and morbid obesity he has not been exercising lately and blames it on a stye that she then he has in right.  He has seen diabetologist who did an an in office A1c result was 7.2.  Weight has increased over the past 4 months from 390 pounds to 407 pounds today. He denies chest pain or respiratory symptoms as well as symptoms of neuropathy.  There have been no recent flares of gout.  Review of Systems:  Review of Systems  Constitutional: Negative.   Eyes:  Positive for pain.       Related to 2-monthhistory of stye OD  Respiratory: Negative.    Cardiovascular: Negative.   Gastrointestinal: Negative.   Genitourinary: Negative.   Neurological: Negative.   Psychiatric/Behavioral: Negative.    All other systems reviewed and are negative.  Past Medical History:  Diagnosis Date   Asymptomatic varicose veins    Chronic kidney disease    Diaphragmatic hernia without mention of obstruction or gangrene    DM (diabetes mellitus) (HCC)    GERD (gastroesophageal reflux disease)    Gout, unspecified    Hypercalcemia    Hyperlipidemia    Hyperosmolality and/or hypernatremia    Hypertension    Monoclonal paraproteinemia    New daily persistent headache     Osteoarthrosis, unspecified whether generalized or localized, lower leg    Other malaise and fatigue    Primary localized osteoarthrosis, unspecified site    Unspecified disorder of kidney and ureter    Unspecified hereditary and idiopathic peripheral neuropathy    Past Surgical History:  Procedure Laterality Date   BIOPSY  10/19/2018   Procedure: BIOPSY;  Surgeon: BRonald Lobo MD;  Location: [facility];  Service: Endoscopy;;   CARPAL TUNNEL RELEASE  2007   COLONOSCOPY N/A 06/06/2013   Procedure: COLONOSCOPY;  Surgeon: RCleotis Nipper MD;  [facility];  Service: Endoscopy;  Laterality: N/A;   COLONOSCOPY WITH PROPOFOL N/A 10/19/2018   Procedure: COLONOSCOPY WITH PROPOFOL;  Surgeon: BRonald Lobo MD;  Location: [facility];  Service: Endoscopy;  Laterality: N/A;   POLYPECTOMY  10/19/2018   Procedure: POLYPECTOMY;  Surgeon: BRonald Lobo MD;  Location: [facility];  Service: Endoscopy;;   TUMOR REMOVAL  2017   Pituitary adenoma   Social History:   reports that he has never smoked. He has never used smokeless tobacco. He reports that he does not drink alcohol and does not use drugs.  Family History  Problem Relation Age of Onset   Hypertension Sister    Hypertension Brother    Diabetes Brother    Hypertension Brother    Hypertension Sister    Migraines Neg Hx     Medications: Patient's Medications  New Prescriptions   No medications on file  Previous Medications  ALLOPURINOL (ZYLOPRIM) 100 MG TABLET    Take 1 tablet (100 mg total) by mouth 2 (two) times daily.   AMLODIPINE (NORVASC) 10 MG TABLET    TAKE 1 TABLET DAILY FOR BLOOD PRESSURE   ASPIRIN EC 81 MG TABLET    Take 1 tablet (81 mg total) by mouth daily.   ATORVASTATIN (LIPITOR) 40 MG TABLET    Take 1 tablet (40 mg total) by mouth daily.   CHLORTHALIDONE (HYGROTON) 25 MG TABLET    TAKE 1 TABLET DAILY FOR BLOOD PRESSURE   CHOLECALCIFEROL (VITAMIN D3) 2000 UNITS TABS    Take 2,000 Units by mouth daily.     FREESTYLE LITE TEST STRIP    CHECK BLOOD SUGAR THREE TIMES A DAY   GABAPENTIN (NEURONTIN) 300 MG CAPSULE    Take 1 capsule (300 mg total) by mouth daily.   INSULIN GLARGINE (LANTUS SOLOSTAR) 100 UNIT/ML SOLOSTAR PEN    Inject 66 units under the skin daily.   INSULIN LISPRO (HUMALOG KWIKPEN) 100 UNIT/ML KWIKPEN    INJECT 22 UNITS BEFORE BREAKFAST, 26 UNITS BEFORE LUNCH, 26 UNITS BEFORE SUPPER PLUS SLIDING SCALE.   INSULIN PEN NEEDLE (SURE COMFORT PEN NEEDLES) 32G X 4 MM MISC    Use 4 times daily before meals DX: E11.65   IRBESARTAN (AVAPRO) 300 MG TABLET    TAKE 1 TABLET DAILY   METHYLPREDNISOLONE (MEDROL DOSEPAK) 4 MG TBPK TABLET    Take as directed   NEOMYCIN-POLYMYXIN B-DEXAMETHASONE (MAXITROL) 3.5-10000-0.1 SUSP    SMARTSIG:In Eye(s)   OMEGA-3 FATTY ACIDS (FISH OIL) 1200 MG CAPS    Take 1,200 mg by mouth daily.    PANTOPRAZOLE (PROTONIX) 40 MG TABLET    Take 1 tablet (40 mg total) by mouth daily.   POTASSIUM CHLORIDE SA (KLOR-CON M) 20 MEQ TABLET    TAKE 1 TABLET TWICE A DAY   PREDNISOLONE ACETATE (PRED FORTE) 1 % OPHTHALMIC SUSPENSION    Place 1 drop into the right eye 2 (two) times daily.   TRAMADOL (ULTRAM) 50 MG TABLET    TAKE 1 TABLET EVERY 8 HOURS AS NEEDED FOR UP TO 5 DAYS   TRAMADOL (ULTRAM) 50 MG TABLET    1 tablet as needed   TRAMADOL (ULTRAM) 50 MG TABLET    1 tablet as needed dx M1A.30X0  Modified Medications   No medications on file  Discontinued Medications   No medications on file    Physical Exam:  Vitals:   03/05/22 0821  BP: 138/88  Pulse: 90  Temp: 98.4 F (36.9 C)  SpO2: 97%  Weight: (!) 407 lb (184.6 kg)  Height: '5\' 9"'$  (1.753 m)   Body mass index is 60.1 kg/m. Wt Readings from Last 3 Encounters:  03/05/22 (!) 407 lb (184.6 kg)  10/29/21 (!) 390 lb 6.4 oz (177.1 kg)  07/03/21 (!) 394 lb (178.7 kg)    Physical Exam Vitals and nursing note reviewed.  Constitutional:      Appearance: He is obese.  Cardiovascular:     Rate and Rhythm: Normal rate  and regular rhythm.  Pulmonary:     Effort: Pulmonary effort is normal.     Breath sounds: Normal breath sounds.  Abdominal:     Tenderness: There is no abdominal tenderness. There is no guarding.  Neurological:     General: No focal deficit present.     Mental Status: He is alert and oriented to person, place, and time.  Psychiatric:        Mood and Affect:  Mood normal.        Behavior: Behavior normal.        Thought Content: Thought content normal.        Judgment: Judgment normal.    Labs reviewed: Basic Metabolic Panel: No results for input(s): NA, K, CL, CO2, GLUCOSE, BUN, CREATININE, CALCIUM, MG, PHOS, TSH in the last 8760 hours. Liver Function Tests: No results for input(s): AST, ALT, ALKPHOS, BILITOT, PROT, ALBUMIN in the last 8760 hours. No results for input(s): LIPASE, AMYLASE in the last 8760 hours. No results for input(s): AMMONIA in the last 8760 hours. CBC: No results for input(s): WBC, NEUTROABS, HGB, HCT, MCV, PLT in the last 8760 hours. Lipid Panel: No results for input(s): CHOL, HDL, LDLCALC, TRIG, CHOLHDL, LDLDIRECT in the last 8760 hours. TSH: No results for input(s): TSH in the last 8760 hours. A1C: Lab Results  Component Value Date   HGBA1C 7.5 (H) 02/13/2021     Assessment/Plan  1. Stage 3a chronic kidney disease (HCC) CKD appears stable.  Has follow-up appointment with nephrology  2. Type 2 diabetes mellitus with stage 3b chronic kidney disease, with long-term current use of insulin ([place]) Seen diabetologist with A1c of 7.2 indicating decent control  3. Hyperlipidemia associated with type 2 diabetes mellitus ([place]) It has been about a year and a half since lipids were checked and there is some question as to whether or not that will be covered with medic care so at the time of this dictation I am not sure whether this will get done or not  4. Class 3 severe obesity with serious comorbidity and body mass index (BMI) of 50.0 to 59.9 in adult,  unspecified obesity type ([place]) Weight has increased have recommended continued exercise program as well as carb restriction patient has had success before with weight loss and expectation is that he will be compliant again  5. Diabetic nephropathy associated with type 2 diabetes mellitus ([place]) Recent labs show creatinine of 1.3 and GFR 46 and this is stable   Alain Honey, MD [place] [phone]

## 2022-03-05 NOTE — Patient Instructions (Signed)
Need to reduce carbs and lose weight

## 2022-03-05 NOTE — Telephone Encounter (Signed)
Patient called and stated that [facility] did not receive his Rx for Allopurinol.   Dr. Sabra Heck forgot to change status to "Normal" to send it was on "No Print"  Rx sent to pharmacy.

## 2022-03-06 LAB — COMPLETE METABOLIC PANEL WITH GFR
AG Ratio: 1 (calc) (ref 1.0–2.5)
ALT: 12 U/L (ref 9–46)
AST: 13 U/L (ref 10–35)
Albumin: 3.6 g/dL (ref 3.6–5.1)
Alkaline phosphatase (APISO): 79 U/L (ref 35–144)
BUN/Creatinine Ratio: 13 (calc) (ref 6–22)
BUN: 19 mg/dL (ref 7–25)
CO2: 28 mmol/L (ref 20–32)
Calcium: 10 mg/dL (ref 8.6–10.3)
Chloride: 99 mmol/L (ref 98–110)
Creat: 1.44 mg/dL — ABNORMAL HIGH (ref 0.70–1.28)
Globulin: 3.7 g/dL (calc) (ref 1.9–3.7)
Glucose, Bld: 121 mg/dL — ABNORMAL HIGH (ref 65–99)
Potassium: 3.6 mmol/L (ref 3.5–5.3)
Sodium: 139 mmol/L (ref 135–146)
Total Bilirubin: 0.3 mg/dL (ref 0.2–1.2)
Total Protein: 7.3 g/dL (ref 6.1–8.1)
eGFR: 51 mL/min/{1.73_m2} — ABNORMAL LOW (ref >=60)

## 2022-03-06 LAB — LIPID PANEL
Cholesterol: 174 mg/dL (ref <200)
HDL: 50 mg/dL (ref >=40)
LDL Cholesterol (Calc): 105 mg/dL (calc) — ABNORMAL HIGH
Non-HDL Cholesterol (Calc): 124 mg/dL (calc) (ref <130)
Total CHOL/HDL Ratio: 3.5 (calc) (ref <5.0)
Triglycerides: 91 mg/dL (ref <150)

## 2022-03-12 ENCOUNTER — Encounter: Payer: Self-pay | Admitting: Podiatry

## 2022-03-12 ENCOUNTER — Ambulatory Visit (INDEPENDENT_AMBULATORY_CARE_PROVIDER_SITE_OTHER): Payer: Medicare Other | Admitting: Podiatry

## 2022-03-12 DIAGNOSIS — E1142 Type 2 diabetes mellitus with diabetic polyneuropathy: Secondary | ICD-10-CM | POA: Diagnosis not present

## 2022-03-12 DIAGNOSIS — M79675 Pain in left toe(s): Secondary | ICD-10-CM | POA: Diagnosis not present

## 2022-03-12 DIAGNOSIS — M79674 Pain in right toe(s): Secondary | ICD-10-CM | POA: Diagnosis not present

## 2022-03-12 DIAGNOSIS — B351 Tinea unguium: Secondary | ICD-10-CM | POA: Diagnosis not present

## 2022-03-16 NOTE — Progress Notes (Signed)
  Subjective:  Patient ID: Adrian Matthews, male    DOB: [birth year],  MRN: [id number]  Adrian Matthews presents to clinic today for at risk foot care with history of diabetic neuropathy and painful elongated mycotic toenails 1-5 bilaterally which are tender when wearing enclosed shoe gear. Pain is relieved with periodic professional debridement.  Patient states blood glucose was 159 mg/dl today.  Last known HgA1c was 7.2%.  New problem(s): None.   PCP is Wardell Honour, MD , and last visit was Mar 05, 2022.  No Known Allergies  Review of Systems: Negative except as noted in the HPI.  Objective: No changes noted in today's physical examination.  Constitutional Adrian Matthews is a pleasant 76 y.o. African American male, morbidly obese in NAD. AAO x 3.   Vascular CFT immediate b/l LE. Palpable DP/PT pulses b/l LE. Digital hair present b/l. Skin temperature gradient WNL b/l. No pain with calf compression b/l. No edema noted b/l. No cyanosis or clubbing noted b/l LE.  Neurologic Normal speech. Oriented to person, place, and time. Pt has subjective symptoms of neuropathy. Protective sensation intact 5/5 intact bilaterally with 10g monofilament b/l. Vibratory sensation intact b/l.  Dermatologic Pedal integument with normal turgor, texture and tone b/l LE. No open wounds b/l. No interdigital macerations b/l. Toenails 1-5 b/l elongated, thickened, discolored with subungual debris. +Tenderness with dorsal palpation of nailplates. No hyperkeratotic or porokeratotic lesions present.  Orthopedic: Normal muscle strength 5/5 to all lower extremity muscle groups bilaterally. Pes planus deformity noted bilateral LE.Marland Kitchen No pain, crepitus or joint limitation noted with ROM b/l LE.  Patient ambulates independently without assistive aids.   Radiographs: None Assessment/Plan: 1. Pain due to onychomycosis of toenails of both feet   2. Diabetic peripheral neuropathy associated with type 2 diabetes mellitus ([place])      -Patient was evaluated and treated. All patient's and/or POA's questions/concerns answered on today's visit. -Continue diabetic foot care principles: inspect feet daily, monitor glucose as recommended by PCP and/or Endocrinologist, and follow prescribed diet per PCP, Endocrinologist and/or dietician. -Patient to continue soft, supportive shoe gear daily. -Toenails 1-5 b/l were debrided in length and girth with sterile nail nippers and dremel without iatrogenic bleeding.  -Patient/POA to call should there be question/concern in the interim.   Return in about 3 months (around 06/12/2022).  Marzetta Board, DPM

## 2022-04-09 ENCOUNTER — Other Ambulatory Visit: Payer: Self-pay | Admitting: Family Medicine

## 2022-04-09 DIAGNOSIS — E1122 Type 2 diabetes mellitus with diabetic chronic kidney disease: Secondary | ICD-10-CM

## 2022-05-14 ENCOUNTER — Ambulatory Visit (INDEPENDENT_AMBULATORY_CARE_PROVIDER_SITE_OTHER): Payer: Medicare Other | Admitting: Podiatry

## 2022-05-14 ENCOUNTER — Encounter: Payer: Self-pay | Admitting: Podiatry

## 2022-05-14 DIAGNOSIS — M79674 Pain in right toe(s): Secondary | ICD-10-CM | POA: Diagnosis not present

## 2022-05-14 DIAGNOSIS — E1142 Type 2 diabetes mellitus with diabetic polyneuropathy: Secondary | ICD-10-CM

## 2022-05-14 DIAGNOSIS — M79675 Pain in left toe(s): Secondary | ICD-10-CM

## 2022-05-14 DIAGNOSIS — B351 Tinea unguium: Secondary | ICD-10-CM

## 2022-05-14 NOTE — Progress Notes (Signed)
  Subjective:  Patient ID: Adrian Matthews, male    DOB: Oct 12, 1946,  MRN: [id number]  Adrian Matthews presents to clinic today for at risk foot care with history of diabetic neuropathy and painful thick toenails that are difficult to trim. Pain interferes with ambulation. Aggravating factors include wearing enclosed shoe gear. Pain is relieved with periodic professional debridement.  Last A1c was 6.8%. Patient did not check blood glucose today.  New problem(s): None.   PCP is Wardell Honour, MD , and last visit was  Mar 05, 2022  No Known Allergies  Review of Systems: Negative except as noted in the HPI.  Objective: No changes noted in today's physical examination. Constitutional Adrian Matthews is a pleasant 76 y.o. African American male, morbidly obese in NAD. AAO x 3.   Vascular CFT immediate b/l LE. Palpable DP/PT pulses b/l LE. Digital hair present b/l. Skin temperature gradient WNL b/l. No pain with calf compression b/l. No edema noted b/l. No cyanosis or clubbing noted b/l LE.  Neurologic Normal speech. Oriented to person, place, and time. Pt has subjective symptoms of neuropathy. Protective sensation intact 5/5 intact bilaterally with 10g monofilament b/l. Vibratory sensation intact b/l.  Dermatologic Pedal integument with normal turgor, texture and tone b/l LE. No open wounds b/l. No interdigital macerations b/l. Toenails 1-5 b/l elongated, thickened, discolored with subungual debris. +Tenderness with dorsal palpation of nailplates. No hyperkeratotic or porokeratotic lesions present.  Orthopedic: Normal muscle strength 5/5 to all lower extremity muscle groups bilaterally. Pes planus deformity noted bilateral LE.Marland Kitchen No pain, crepitus or joint limitation noted with ROM b/l LE.  Patient ambulates independently without assistive aids.   Radiographs: None  Assessment/Plan: 1. Pain due to onychomycosis of toenails of both feet   2. Diabetic peripheral neuropathy associated with type 2  diabetes mellitus ([place])   -Examined patient. -No new findings. No new orders. -Patient to continue soft, supportive shoe gear daily. -Mycotic toenails 1-5 bilaterally were debrided in length and girth with sterile nail nippers and dremel without incident. -Patient/POA to call should there be question/concern in the interim.   Return in about 3 months (around 08/14/2022).  Marzetta Board, DPM

## 2022-05-27 ENCOUNTER — Telehealth: Payer: Self-pay | Admitting: *Deleted

## 2022-05-27 MED ORDER — GABAPENTIN 300 MG PO CAPS: 300.0000 mg | ORAL_CAPSULE | Freq: Two times a day (BID) | ORAL | 1 refills | Status: DC

## 2022-05-27 NOTE — Telephone Encounter (Signed)
Patient called requesting a Refill on his Gabapentin.  Stated that he is taking it TWICE daily  Is it ok to change to Twice daily instead of once daily.  Please Advise.

## 2022-05-27 NOTE — Telephone Encounter (Signed)
Adrian Honour, MD  You 17 minutes ago (4:32 PM)   Take Gabapentin BID       Medication list updated and Rx sent to pharmacy.

## 2022-06-06 ENCOUNTER — Other Ambulatory Visit: Payer: Self-pay | Admitting: Family Medicine

## 2022-06-06 DIAGNOSIS — K219 Gastro-esophageal reflux disease without esophagitis: Secondary | ICD-10-CM

## 2022-06-30 DIAGNOSIS — N1831 Chronic kidney disease, stage 3a: Secondary | ICD-10-CM | POA: Diagnosis not present

## 2022-06-30 DIAGNOSIS — E221 Hyperprolactinemia: Secondary | ICD-10-CM | POA: Diagnosis not present

## 2022-06-30 DIAGNOSIS — Z794 Long term (current) use of insulin: Secondary | ICD-10-CM | POA: Diagnosis not present

## 2022-06-30 DIAGNOSIS — D126 Benign neoplasm of colon, unspecified: Secondary | ICD-10-CM | POA: Diagnosis not present

## 2022-06-30 DIAGNOSIS — E1129 Type 2 diabetes mellitus with other diabetic kidney complication: Secondary | ICD-10-CM | POA: Diagnosis not present

## 2022-06-30 DIAGNOSIS — E785 Hyperlipidemia, unspecified: Secondary | ICD-10-CM | POA: Diagnosis not present

## 2022-06-30 DIAGNOSIS — D352 Benign neoplasm of pituitary gland: Secondary | ICD-10-CM | POA: Diagnosis not present

## 2022-06-30 DIAGNOSIS — M1A30X0 Chronic gout due to renal impairment, unspecified site, without tophus (tophi): Secondary | ICD-10-CM | POA: Diagnosis not present

## 2022-06-30 DIAGNOSIS — I10 Essential (primary) hypertension: Secondary | ICD-10-CM | POA: Diagnosis not present

## 2022-06-30 DIAGNOSIS — E291 Testicular hypofunction: Secondary | ICD-10-CM | POA: Diagnosis not present

## 2022-07-02 DIAGNOSIS — N1831 Chronic kidney disease, stage 3a: Secondary | ICD-10-CM | POA: Diagnosis not present

## 2022-07-08 ENCOUNTER — Ambulatory Visit: Payer: Medicare Other | Admitting: Family Medicine

## 2022-07-09 ENCOUNTER — Ambulatory Visit (INDEPENDENT_AMBULATORY_CARE_PROVIDER_SITE_OTHER): Payer: Medicare Other | Admitting: Family Medicine

## 2022-07-09 ENCOUNTER — Encounter: Payer: Self-pay | Admitting: Family Medicine

## 2022-07-09 VITALS — BP 132/80 | HR 86 | Temp 97.9°F | Ht 69.0 in | Wt >= 6400 oz

## 2022-07-09 DIAGNOSIS — Z6841 Body Mass Index (BMI) 40.0 and over, adult: Secondary | ICD-10-CM | POA: Diagnosis not present

## 2022-07-09 DIAGNOSIS — N1831 Chronic kidney disease, stage 3a: Secondary | ICD-10-CM

## 2022-07-09 DIAGNOSIS — Z794 Long term (current) use of insulin: Secondary | ICD-10-CM

## 2022-07-09 DIAGNOSIS — E1121 Type 2 diabetes mellitus with diabetic nephropathy: Secondary | ICD-10-CM

## 2022-07-09 DIAGNOSIS — E1122 Type 2 diabetes mellitus with diabetic chronic kidney disease: Secondary | ICD-10-CM | POA: Diagnosis not present

## 2022-07-09 DIAGNOSIS — N1832 Chronic kidney disease, stage 3b: Secondary | ICD-10-CM | POA: Diagnosis not present

## 2022-07-09 DIAGNOSIS — M1A0790 Idiopathic chronic gout, unspecified ankle and foot, without tophus (tophi): Secondary | ICD-10-CM

## 2022-07-09 NOTE — Progress Notes (Signed)
Provider:  Alain Honey, MD  Careteam: Patient Care Team: Wardell Honour, MD as PCP - General (Family Medicine) Elmarie Shiley, MD as Consulting Physician (Nephrology) Sharyne Peach, MD as Consulting Physician (Ophthalmology) Ronald Lobo, MD as Consulting Physician (Gastroenterology)  PLACE OF SERVICE:  [facility] Directive information    No Known Allergies  Chief Complaint  Patient presents with   Medical Management of Chronic Issues    Patient presents today for a 4 month follow-up   Quality Metric Gaps    A1C, zoster, COVID#4     HPI: Patient is a 76 y.o. male patient is here to follow-up chronic medical problems including type 2 diabetes, morbid obesity, hypertension, hyperlipidemia.  He is also followed by his endocrinologist at [place] who does his blood work.  Most recent A1c per the patient was 6.3.  That is in line with the 6.5 from 4 months ago.  He uses combination basal insulin and rapid acting insulin. Lipids were last assessed 5 months ago and LDL was 105 which is slightly up for him. Big concern is his morbid obesity.  He has not been exercising like he used to and he blames that on aging.  Sounds like his diet is not bad.  He avoids fried foods and does not eat sweets and desserts or carbs to excess.  He denies any other complaints today  Review of Systems:  Review of Systems  HENT: Negative.    Eyes: Negative.   Respiratory: Negative.    Cardiovascular: Negative.   Gastrointestinal: Negative.   Genitourinary: Negative.   Musculoskeletal:  Positive for joint pain.       Some knee pain secondary to degenerative arthritis  Skin: Negative.   Neurological: Negative.   Psychiatric/Behavioral: Negative.    All other systems reviewed and are negative.   Past Medical History:  Diagnosis Date   Asymptomatic varicose veins    Chronic kidney disease    Diaphragmatic hernia without mention of obstruction or gangrene    DM (diabetes  mellitus) (HCC)    GERD (gastroesophageal reflux disease)    Gout, unspecified    Hypercalcemia    Hyperlipidemia    Hyperosmolality and/or hypernatremia    Hypertension    Monoclonal paraproteinemia    New daily persistent headache    Osteoarthrosis, unspecified whether generalized or localized, lower leg    Other malaise and fatigue    Primary localized osteoarthrosis, unspecified site    Unspecified disorder of kidney and ureter    Unspecified hereditary and idiopathic peripheral neuropathy    Past Surgical History:  Procedure Laterality Date   BIOPSY  10/19/2018   Procedure: BIOPSY;  Surgeon: Ronald Lobo, MD;  Location: [facility] ENDOSCOPY;  Service: Endoscopy;;   CARPAL TUNNEL RELEASE  2007   COLONOSCOPY N/A 06/06/2013   Procedure: COLONOSCOPY;  Surgeon: Cleotis Nipper, MD;  Location: [facility] ENDOSCOPY;  Service: Endoscopy;  Laterality: N/A;   COLONOSCOPY WITH PROPOFOL N/A 10/19/2018   Procedure: COLONOSCOPY WITH PROPOFOL;  Surgeon: Ronald Lobo, MD;  Location: [facility] ENDOSCOPY;  Service: Endoscopy;  Laterality: N/A;   POLYPECTOMY  10/19/2018   Procedure: POLYPECTOMY;  Surgeon: Ronald Lobo, MD;  Location: [facility] ENDOSCOPY;  Service: Endoscopy;;   TUMOR REMOVAL  2017   Pituitary adenoma   Social History:   reports that he has never smoked. He has never used smokeless tobacco. He reports that he does not drink alcohol and does not use drugs.  Family History  Problem Relation Age of  Onset   Hypertension Sister    Hypertension Brother    Diabetes Brother    Hypertension Brother    Hypertension Sister    Migraines Neg Hx     Medications: Patient's Medications  New Prescriptions   No medications on file  Previous Medications   ALLOPURINOL (ZYLOPRIM) 100 MG TABLET    Take 1 tablet (100 mg total) by mouth 2 (two) times daily.   AMLODIPINE (NORVASC) 10 MG TABLET    TAKE 1 TABLET DAILY FOR BLOOD PRESSURE   ASPIRIN EC 81 MG TABLET    Take 1 tablet (81 mg total) by mouth daily.    ATORVASTATIN (LIPITOR) 40 MG TABLET    Take 1 tablet (40 mg total) by mouth daily.   CHLORTHALIDONE (HYGROTON) 25 MG TABLET    TAKE 1 TABLET DAILY FOR BLOOD PRESSURE   CHOLECALCIFEROL (VITAMIN D3) 2000 UNITS TABS    Take 2,000 Units by mouth daily.    FREESTYLE LITE TEST STRIP    CHECK BLOOD SUGAR THREE TIMES A DAY   GABAPENTIN (NEURONTIN) 300 MG CAPSULE    Take 1 capsule (300 mg total) by mouth 2 (two) times daily.   INSULIN GLARGINE (LANTUS SOLOSTAR) 100 UNIT/ML SOLOSTAR PEN    Inject 66 units under the skin daily.   INSULIN LISPRO (HUMALOG KWIKPEN) 100 UNIT/ML KWIKPEN    INJECT 22 UNITS BEFORE BREAKFAST, 26 UNITS BEFORE LUNCH AND 26 UNITS BEFORE SUPPER PLUS SLIDING SCALE   INSULIN PEN NEEDLE (SURE COMFORT PEN NEEDLES) 32G X 4 MM MISC    Use 4 times daily before meals DX: E11.65   IRBESARTAN (AVAPRO) 300 MG TABLET    TAKE 1 TABLET DAILY   METHYLPREDNISOLONE (MEDROL DOSEPAK) 4 MG TBPK TABLET    Take as directed   NEOMYCIN-POLYMYXIN B-DEXAMETHASONE (MAXITROL) 3.5-10000-0.1 SUSP    SMARTSIG:In Eye(s)   OMEGA-3 FATTY ACIDS (FISH OIL) 1200 MG CAPS    Take 1,200 mg by mouth daily.    PANTOPRAZOLE (PROTONIX) 40 MG TABLET    TAKE 1 TABLET DAILY   POTASSIUM CHLORIDE SA (KLOR-CON M) 20 MEQ TABLET    TAKE 1 TABLET TWICE A DAY   PREDNISOLONE ACETATE (PRED FORTE) 1 % OPHTHALMIC SUSPENSION    Place 1 drop into the right eye 2 (two) times daily.   TRAMADOL (ULTRAM) 50 MG TABLET    1 tablet as needed dx M1A.30X0 Orally Once a day for 90 days  Modified Medications   No medications on file  Discontinued Medications   No medications on file    Physical Exam:  There were no vitals filed for this visit. There is no height or weight on file to calculate BMI. Wt Readings from Last 3 Encounters:  03/05/22 (!) 407 lb (184.6 kg)  10/29/21 (!) 390 lb 6.4 oz (177.1 kg)  07/03/21 (!) 394 lb (178.7 kg)    Physical Exam Vitals and nursing note reviewed.  Constitutional:      Appearance: He is obese.  HENT:      Head: Normocephalic.     Nose: Nose normal.  Eyes:     Extraocular Movements: Extraocular movements intact.     Pupils: Pupils are equal, round, and reactive to light.  Cardiovascular:     Rate and Rhythm: Normal rate and regular rhythm.  Pulmonary:     Effort: Pulmonary effort is normal.     Breath sounds: Normal breath sounds.  Abdominal:     General: Abdomen is flat.     Palpations: Abdomen is  soft.  Musculoskeletal:        General: Normal range of motion.  Neurological:     General: No focal deficit present.     Mental Status: He is alert and oriented to person, place, and time.     Labs reviewed: Basic Metabolic Panel: Recent Labs    03/05/22 0910  NA 139  K 3.6  CL 99  CO2 28  GLUCOSE 121*  BUN 19  CREATININE 1.44*  CALCIUM 10.0   Liver Function Tests: Recent Labs    03/05/22 0910  AST 13  ALT 12  BILITOT 0.3  PROT 7.3   No results for input(s): "LIPASE", "AMYLASE" in the last 8760 hours. No results for input(s): "AMMONIA" in the last 8760 hours. CBC: No results for input(s): "WBC", "NEUTROABS", "HGB", "HCT", "MCV", "PLT" in the last 8760 hours. Lipid Panel: Recent Labs    03/05/22 0910  CHOL 174  HDL 50  LDLCALC 105*  TRIG 91  CHOLHDL 3.5   TSH: No results for input(s): "TSH" in the last 8760 hours. A1C: Lab Results  Component Value Date   HGBA1C 7.5 (H) 02/13/2021     Assessment/Plan  1. Stage 3a chronic kidney disease ([place]) Followed by nephrology.  Renal function is stable.  He is on ARB.  Tries to stay hydrated  2. Class 3 severe obesity with serious comorbidity and body mass index (BMI) of 50.0 to 59.9 in adult, unspecified obesity type ([place]) Again reminded him of the importance of carbs and exercise as exercising has declined as he has gotten older but still enjoys swimming 1 the temperature of the swimming pool was suitable  3. Diabetic nephropathy associated with type 2 diabetes mellitus ([place]) Renal function is stable and  followed by nephrology  4. Idiopathic chronic gout of foot without tophus, unspecified laterality Continues on allopurinol no recent flares of gout  5. Type 2 diabetes mellitus with stage 3b chronic kidney disease, with long-term current use of insulin ([place]) In looking at A1c's over the past several visits diabetes is well controlled on current regimen.  No changes are necessary   Alain Honey, MD [place] [phone]

## 2022-07-11 DIAGNOSIS — N1831 Chronic kidney disease, stage 3a: Secondary | ICD-10-CM | POA: Diagnosis not present

## 2022-07-11 DIAGNOSIS — I129 Hypertensive chronic kidney disease with stage 1 through stage 4 chronic kidney disease, or unspecified chronic kidney disease: Secondary | ICD-10-CM | POA: Diagnosis not present

## 2022-07-11 DIAGNOSIS — D631 Anemia in chronic kidney disease: Secondary | ICD-10-CM | POA: Diagnosis not present

## 2022-07-11 DIAGNOSIS — N2581 Secondary hyperparathyroidism of renal origin: Secondary | ICD-10-CM | POA: Diagnosis not present

## 2022-07-22 DIAGNOSIS — E119 Type 2 diabetes mellitus without complications: Secondary | ICD-10-CM | POA: Diagnosis not present

## 2022-07-22 DIAGNOSIS — H5213 Myopia, bilateral: Secondary | ICD-10-CM | POA: Diagnosis not present

## 2022-07-22 DIAGNOSIS — H25013 Cortical age-related cataract, bilateral: Secondary | ICD-10-CM | POA: Diagnosis not present

## 2022-07-22 LAB — HM DIABETES EYE EXAM

## 2022-07-25 ENCOUNTER — Other Ambulatory Visit: Payer: Self-pay | Admitting: Family Medicine

## 2022-07-25 DIAGNOSIS — E876 Hypokalemia: Secondary | ICD-10-CM

## 2022-08-18 ENCOUNTER — Ambulatory Visit (INDEPENDENT_AMBULATORY_CARE_PROVIDER_SITE_OTHER): Payer: Medicare Other | Admitting: Podiatry

## 2022-08-18 ENCOUNTER — Encounter: Payer: Self-pay | Admitting: Podiatry

## 2022-08-18 DIAGNOSIS — E1142 Type 2 diabetes mellitus with diabetic polyneuropathy: Secondary | ICD-10-CM

## 2022-08-18 DIAGNOSIS — M79674 Pain in right toe(s): Secondary | ICD-10-CM | POA: Diagnosis not present

## 2022-08-18 DIAGNOSIS — B351 Tinea unguium: Secondary | ICD-10-CM | POA: Diagnosis not present

## 2022-08-18 DIAGNOSIS — M79675 Pain in left toe(s): Secondary | ICD-10-CM

## 2022-08-18 DIAGNOSIS — E1122 Type 2 diabetes mellitus with diabetic chronic kidney disease: Secondary | ICD-10-CM | POA: Insufficient documentation

## 2022-08-18 NOTE — Progress Notes (Signed)
  Subjective:  Patient ID: Adrian Matthews, male    DOB: 21-Dec-1945,  MRN: [id number]  Adrian Matthews presents to clinic today for at risk foot care with history of diabetic neuropathy and painful elongated mycotic toenails 1-5 bilaterally which are tender when wearing enclosed shoe gear. Pain is relieved with periodic professional debridement.   Chief Complaint  Patient presents with   Nail Problem    Patient here today for diabetic nail care. Relates his left great toenail grew long and "broke off". Last A1c was 6.3%. He did not check blood glucose this morning.   New problem(s): None.   PCP is Wardell Honour, MD , and last visit was July 09, 2022.  No Known Allergies  Review of Systems: Negative except as noted in the HPI.  Objective: No changes noted in today's physical examination.  Adrian Matthews is a pleasant 76 y.o. male morbidly obese in NAD. AAO x 3. Vascular CFT immediate b/l LE. Palpable DP/PT pulses b/l LE. Digital hair present b/l. Skin temperature gradient WNL b/l. No pain with calf compression b/l. No edema noted b/l. No cyanosis or clubbing noted b/l LE.  Neurologic Normal speech. Oriented to person, place, and time. Pt has subjective symptoms of neuropathy. Protective sensation intact 5/5 intact bilaterally with 10g monofilament b/l. Vibratory sensation intact b/l.  Dermatologic Pedal integument with normal turgor, texture and tone b/l LE. No open wounds b/l. No interdigital macerations b/l. Toenails 1-5 b/l elongated, thickened, discolored with subungual debris. +Tenderness with dorsal palpation of nailplates. No hyperkeratotic or porokeratotic lesions present.  Orthopedic: Normal muscle strength 5/5 to all lower extremity muscle groups bilaterally. Pes planus deformity noted bilateral LE.Marland Kitchen No pain, crepitus or joint limitation noted with ROM b/l LE.  Patient ambulates independently without assistive aids.   Radiographs: None  Assessment/Plan: 1. Pain due to  onychomycosis of toenails of both feet   2. Diabetic peripheral neuropathy associated with type 2 diabetes mellitus ([place])     No orders of the defined types were placed in this encounter.   -Patient was evaluated and treated. All patient's and/or POA's questions/concerns answered on today's visit. -Continue diabetic foot care principles: inspect feet daily, monitor glucose as recommended by PCP and/or Endocrinologist, and follow prescribed diet per PCP, Endocrinologist and/or dietician. -Continue supportive shoe gear daily. -Toenails 1-5 b/l were debrided in length and girth with sterile nail nippers and dremel without iatrogenic bleeding.  -Patient/POA to call should there be question/concern in the interim.   Return in about 3 months (around 11/18/2022).  Marzetta Board, DPM

## 2022-08-19 ENCOUNTER — Telehealth: Payer: Self-pay

## 2022-08-19 DIAGNOSIS — N182 Chronic kidney disease, stage 2 (mild): Secondary | ICD-10-CM

## 2022-08-19 MED ORDER — LANTUS SOLOSTAR 100 UNIT/ML ~~LOC~~ SOPN: PEN_INJECTOR | SUBCUTANEOUS | 4 refills | Status: DC

## 2022-08-19 NOTE — Telephone Encounter (Signed)
Patient called requesting rx for lantus and medication refill was send into pharmacy.

## 2022-09-01 ENCOUNTER — Other Ambulatory Visit: Payer: Self-pay | Admitting: Family Medicine

## 2022-09-01 DIAGNOSIS — I10 Essential (primary) hypertension: Secondary | ICD-10-CM

## 2022-09-08 ENCOUNTER — Other Ambulatory Visit: Payer: Self-pay | Admitting: Family Medicine

## 2022-10-05 ENCOUNTER — Other Ambulatory Visit: Payer: Self-pay | Admitting: Family Medicine

## 2022-10-23 ENCOUNTER — Other Ambulatory Visit: Payer: Self-pay | Admitting: Family Medicine

## 2022-11-18 ENCOUNTER — Ambulatory Visit (INDEPENDENT_AMBULATORY_CARE_PROVIDER_SITE_OTHER): Payer: Medicare Other | Admitting: Podiatry

## 2022-11-18 ENCOUNTER — Encounter: Payer: Self-pay | Admitting: Podiatry

## 2022-11-18 VITALS — BP 179/73 | HR 79 | Temp 97.6°F

## 2022-11-18 DIAGNOSIS — E1142 Type 2 diabetes mellitus with diabetic polyneuropathy: Secondary | ICD-10-CM | POA: Insufficient documentation

## 2022-11-18 DIAGNOSIS — M2142 Flat foot [pes planus] (acquired), left foot: Secondary | ICD-10-CM

## 2022-11-18 DIAGNOSIS — R030 Elevated blood-pressure reading, without diagnosis of hypertension: Secondary | ICD-10-CM | POA: Diagnosis not present

## 2022-11-18 DIAGNOSIS — M79675 Pain in left toe(s): Secondary | ICD-10-CM | POA: Diagnosis not present

## 2022-11-18 DIAGNOSIS — B351 Tinea unguium: Secondary | ICD-10-CM | POA: Diagnosis not present

## 2022-11-18 DIAGNOSIS — M2141 Flat foot [pes planus] (acquired), right foot: Secondary | ICD-10-CM | POA: Diagnosis not present

## 2022-11-18 DIAGNOSIS — M79674 Pain in right toe(s): Secondary | ICD-10-CM | POA: Diagnosis not present

## 2022-11-18 DIAGNOSIS — E119 Type 2 diabetes mellitus without complications: Secondary | ICD-10-CM | POA: Diagnosis not present

## 2022-11-18 DIAGNOSIS — M2140 Flat foot [pes planus] (acquired), unspecified foot: Secondary | ICD-10-CM | POA: Insufficient documentation

## 2022-11-18 NOTE — Progress Notes (Signed)
ANNUAL DIABETIC FOOT EXAM  Subjective: Adrian Matthews presents today for annual diabetic foot examination.  Chief Complaint  Patient presents with   Follow-up    Diabetic Foot care Patient is a diabetic B.S am- n/a Last A1c-7.5 PCP Dr. Annie Main Miller/Last visit: 7 months ago Patient is taking all medication prescribed.   Patient confirms h/o diabetes.  Patient denies any h/o foot wounds.  Patient endorses symptoms of pins/needles sensation in feet.  Patient denies any numbness, tingling, burning, or pins/needle sensation in feet.  Patient has been diagnosed with neuropathy and it is managed with gabapentin.  Risk factors: diabetes, diabetic neuropathy, history of gout, PAD with intermittent claudication, CKD, hyperlipidemia, diabetic renal disease.  Wardell Honour, MD is patient's PCP.   Past Medical History:  Diagnosis Date   Asymptomatic varicose veins    Chronic kidney disease    Diaphragmatic hernia without mention of obstruction or gangrene    DM (diabetes mellitus) ([place])    GERD (gastroesophageal reflux disease)    Gout, unspecified    Hypercalcemia    Hyperlipidemia    Hyperosmolality and/or hypernatremia    Hypertension    Monoclonal paraproteinemia    New daily persistent headache    Osteoarthrosis, unspecified whether generalized or localized, lower leg    Other malaise and fatigue    Primary localized osteoarthrosis, unspecified site    Unspecified disorder of kidney and ureter    Unspecified hereditary and idiopathic peripheral neuropathy    Patient Active Problem List   Diagnosis Date Noted   Flat foot 11/18/2022   Pain due to onychomycosis of toenails of both feet 11/18/2022   Diabetic peripheral neuropathy associated with type 2 diabetes mellitus ([place]) 11/18/2022   Type 2 diabetes mellitus with diabetic chronic kidney disease ([place]) 08/18/2022   Chronic gouty arthritis 01/07/2022   Gastroesophageal reflux disease 06/26/2021   General medical  examination for administrative purposes 06/26/2021   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   Pain in right knee 06/26/2021   Claudication of both lower extremities ([place]) 06/07/2020   Type 2 diabetes mellitus with diabetic neuropathy, with long-term current use of insulin ([place]) 01/06/2019   Benign neoplasm of pituitary gland ([place]) 11/24/2018   Hyperlipidemia 11/24/2018   Iron deficiency anemia 08/30/2018   Generalized osteoarthritis of multiple sites 08/30/2018   Secondary hyperparathyroidism of renal origin ([place]) 08/03/2018   Chronic kidney disease, stage 3 ([place]) 12/17/2017   Long term (current) use of insulin ([place]) 11/13/2017   Testicular hypofunction 11/13/2017   Testosterone deficiency in male 08/10/2017   Pituitary microadenoma with hyperprolactinemia ([place]) 01/02/2017   Hormone deficiency 01/02/2017   At risk for obstructive sleep apnea 09/25/2016   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   Benign neoplasm of colon 04/24/2016   Diabetic renal disease ([place]) 04/24/2016   Morning headache 02/11/2016   Arthritis 10/27/2014   Class 3 severe obesity with serious comorbidity and body mass index (BMI) of 50.0 to 59.9 in adult [facility]) 06/04/2013   Rectal bleeding 06/04/2013   Osteoarthrosis, unspecified whether generalized or localized, lower leg    Monoclonal paraproteinemia    Gout, unspecified    Type 2 diabetes mellitus with stage 3 chronic kidney disease, with long-term current use of insulin ([place]) 02/28/2013   Hyperlipidemia associated with type 2 diabetes mellitus ([place]) 02/28/2013   Essential hypertension 02/28/2013   Unspecified disorder of kidney and ureter 02/28/2013   Past Surgical History:  Procedure Laterality Date   BIOPSY  10/19/2018   Procedure: BIOPSY;  Surgeon: Ronald Lobo, MD;  Location: [facility] ENDOSCOPY;  Service: Endoscopy;;   CARPAL TUNNEL RELEASE  2007   COLONOSCOPY N/A 06/06/2013   Procedure: COLONOSCOPY;  Surgeon: Cleotis Nipper, MD;  Location:  [facility] ENDOSCOPY;  Service: Endoscopy;  Laterality: N/A;   COLONOSCOPY WITH PROPOFOL N/A 10/19/2018   Procedure: COLONOSCOPY WITH PROPOFOL;  Surgeon: Ronald Lobo, MD;  Location: [facility] ENDOSCOPY;  Service: Endoscopy;  Laterality: N/A;   POLYPECTOMY  10/19/2018   Procedure: POLYPECTOMY;  Surgeon: Ronald Lobo, MD;  Location: [facility] ENDOSCOPY;  Service: Endoscopy;;   TUMOR REMOVAL  2017   Pituitary adenoma   Current Outpatient Medications on File Prior to Visit  Medication Sig Dispense Refill   allopurinol (ZYLOPRIM) 100 MG tablet Take 1 tablet (100 mg total) by mouth 2 (two) times daily. 180 tablet 3   amLODipine (NORVASC) 10 MG tablet TAKE 1 TABLET DAILY FOR BLOOD PRESSURE 90 tablet 3   aspirin EC 81 MG tablet Take 1 tablet (81 mg total) by mouth daily. 30 tablet 11   atorvastatin (LIPITOR) 40 MG tablet Take 1 tablet (40 mg total) by mouth daily. 90 tablet 3   chlorthalidone (HYGROTON) 25 MG tablet TAKE 1 TABLET DAILY FOR BLOOD PRESSURE 90 tablet 3   Cholecalciferol (VITAMIN D3) 2000 units TABS Take 2,000 Units by mouth daily.      FREESTYLE LITE test strip CHECK BLOOD SUGAR THREE TIMES A DAY 300 strip 3   gabapentin (NEURONTIN) 300 MG capsule TAKE 1 CAPSULE TWICE A DAY 180 capsule 3   insulin glargine (LANTUS SOLOSTAR) 100 UNIT/ML Solostar Pen Inject 66 units under the skin daily. 60 mL 4   insulin lispro (HUMALOG KWIKPEN) 100 UNIT/ML KwikPen INJECT 22 UNITS BEFORE BREAKFAST, 26 UNITS BEFORE LUNCH AND 26 UNITS BEFORE SUPPER PLUS SLIDING SCALE 75 mL 3   Insulin Pen Needle (SURE COMFORT PEN NEEDLES) 32G X 4 MM MISC Use 4 times daily before meals DX: E11.65 500 each 1   irbesartan (AVAPRO) 300 MG tablet TAKE 1 TABLET DAILY 90 tablet 3   methylPREDNISolone (MEDROL DOSEPAK) 4 MG TBPK tablet Take as directed 21 tablet 0   neomycin-polymyxin b-dexamethasone (MAXITROL) 3.5-10000-0.1 SUSP SMARTSIG:In Eye(s)     Omega-3 Fatty Acids (FISH OIL) 1200 MG CAPS Take 1,200 mg by mouth daily.      pantoprazole  (PROTONIX) 40 MG tablet TAKE 1 TABLET DAILY 90 tablet 3   potassium chloride SA (KLOR-CON M) 20 MEQ tablet TAKE 1 TABLET TWICE A DAY 180 tablet 1   prednisoLONE acetate (PRED FORTE) 1 % ophthalmic suspension Place 1 drop into the right eye 2 (two) times daily.     traMADol (ULTRAM) 50 MG tablet 1 tablet as needed dx M1A.30X0 Orally Once a day for 90 days     No current facility-administered medications on file prior to visit.    No Known Allergies Social History   Occupational History   Occupation: Retired   Tobacco Use   Smoking status: Never   Smokeless tobacco: Never  Vaping Use   Vaping Use: Never used  Substance and Sexual Activity   Alcohol use: No    Alcohol/week: 0.0 standard drinks of alcohol   Drug use: No   Sexual activity: Not Currently   Family History  Problem Relation Age of Onset   Hypertension Sister    Hypertension Brother    Diabetes Brother    Hypertension Brother    Hypertension Sister    Migraines Neg Hx    Immunization History  Administered  Date(s) Administered   Fluad Quad(high Dose 65+) 07/20/2019, 07/11/2020   Influenza, High Dose Seasonal PF 07/25/2016, 07/09/2017, 08/26/2018   Influenza, Quadrivalent, Recombinant, Inj, Pf 06/24/2021   Influenza,inj,Quad PF,6+ Mos 07/04/2013   Influenza-Unspecified 07/13/2014, 07/28/2015   PFIZER(Purple Top)SARS-COV-2 Vaccination 12/11/2019, 01/10/2020, 07/28/2020   Pneumococcal Conjugate-13 08/17/2014   Pneumococcal Polysaccharide-23 08/08/2013   Tdap 04/12/2013   Zoster, Live 08/08/2013    Review of Systems: Negative except as noted in the HPI.   Objective: Vitals:   11/18/22 0812 11/18/22 0833  BP: (!) 199/67 (!) 179/73  Pulse: 79   Temp: 97.6 F (36.4 C)   SpO2: 98% 98%  Patient states he has not taken his blood pressure medication this morning. He is asymptomatic.  Shana Younge is a pleasant 76 y.o. male in NAD. AAO X 3.  Vascular Examination: Capillary refill time immediate b/l. Vascular  status intact b/l with palpable pedal pulses. Pedal hair sparse b/l.  No pain with calf compression b/l. Skin temperature gradient WNL b/l. Nonpitting edema noted BLE.  Neurological Examination: Sensation grossly intact b/l with 10 gram monofilament. Vibratory sensation intact b/l. Pt has subjective symptoms of neuropathy.  Dermatological Examination: Pedal skin with normal turgor, texture and tone b/l.  No open wounds. No interdigital macerations.   Toenails 1-5 b/l thick, discolored, elongated with subungual debris and pain on dorsal palpation.   No hyperkeratotic nor porokeratotic lesions present on today's visit.  Musculoskeletal Examination: Normal muscle strength 5/5 to all lower extremity muscle groups bilaterally. Pes planus deformity noted bilateral LE. No pain, crepitus or joint limitation noted with ROM b/l LE.  Patient ambulates independently without assistive aids.  Radiographs: None  Footwear Assessment: Does the patient wear appropriate shoes? Yes. Does the patient need inserts/orthotics? Yes.  Lab Results  Component Value Date   HGBA1C 7.5 (H) 02/13/2021   ADA Risk Categorization: Low Risk :  Patient has all of the following: Intact protective sensation No prior foot ulcer  No severe deformity Pedal pulses present  Assessment: 1. Pain due to onychomycosis of toenails of both feet   2. Pes planus of both feet   3. Elevated blood pressure reading   4. Diabetic peripheral neuropathy associated with type 2 diabetes mellitus ([place])   5. Encounter for diabetic foot exam [facility])     Plan: -Patient was evaluated and treated. All patient's and/or POA's questions/concerns answered on today's visit. -Discussed elevated blood pressure reading with patient. Patient advised to take blood pressure medication when they get home. Patient to repeat blood pressure at home and contact PCP/Cardiologist if it remains elevated. Patient/Family/Caregiver/POA related  understanding. -Diabetic foot examination performed today. -Stressed the importance of good glycemic control and the detriment of not  controlling glucose levels in relation to the foot. -Continue diabetic foot care principles: inspect feet daily, monitor glucose as recommended by PCP and/or Endocrinologist, and follow prescribed diet per PCP, Endocrinologist and/or dietician. -Continue supportive shoe gear daily. -Toenails 1-5 b/l were debrided in length and girth with sterile nail nippers and dremel without iatrogenic bleeding.  -Patient/POA to call should there be question/concern in the interim. Return in about 3 months (around 02/16/2023).  Marzetta Board, DPM

## 2022-12-18 DIAGNOSIS — D352 Benign neoplasm of pituitary gland: Secondary | ICD-10-CM | POA: Diagnosis not present

## 2022-12-18 DIAGNOSIS — N1831 Chronic kidney disease, stage 3a: Secondary | ICD-10-CM | POA: Diagnosis not present

## 2022-12-18 DIAGNOSIS — E785 Hyperlipidemia, unspecified: Secondary | ICD-10-CM | POA: Diagnosis not present

## 2022-12-18 DIAGNOSIS — D126 Benign neoplasm of colon, unspecified: Secondary | ICD-10-CM | POA: Diagnosis not present

## 2022-12-18 DIAGNOSIS — Z794 Long term (current) use of insulin: Secondary | ICD-10-CM | POA: Diagnosis not present

## 2022-12-18 DIAGNOSIS — M1A30X0 Chronic gout due to renal impairment, unspecified site, without tophus (tophi): Secondary | ICD-10-CM | POA: Diagnosis not present

## 2022-12-18 DIAGNOSIS — N2581 Secondary hyperparathyroidism of renal origin: Secondary | ICD-10-CM | POA: Diagnosis not present

## 2022-12-18 DIAGNOSIS — E1129 Type 2 diabetes mellitus with other diabetic kidney complication: Secondary | ICD-10-CM | POA: Diagnosis not present

## 2022-12-18 DIAGNOSIS — E221 Hyperprolactinemia: Secondary | ICD-10-CM | POA: Diagnosis not present

## 2022-12-18 DIAGNOSIS — E291 Testicular hypofunction: Secondary | ICD-10-CM | POA: Diagnosis not present

## 2022-12-18 DIAGNOSIS — I129 Hypertensive chronic kidney disease with stage 1 through stage 4 chronic kidney disease, or unspecified chronic kidney disease: Secondary | ICD-10-CM | POA: Diagnosis not present

## 2022-12-29 DIAGNOSIS — N2581 Secondary hyperparathyroidism of renal origin: Secondary | ICD-10-CM | POA: Diagnosis not present

## 2022-12-29 DIAGNOSIS — N1831 Chronic kidney disease, stage 3a: Secondary | ICD-10-CM | POA: Diagnosis not present

## 2022-12-29 DIAGNOSIS — D631 Anemia in chronic kidney disease: Secondary | ICD-10-CM | POA: Diagnosis not present

## 2022-12-29 DIAGNOSIS — N189 Chronic kidney disease, unspecified: Secondary | ICD-10-CM | POA: Diagnosis not present

## 2022-12-29 DIAGNOSIS — I129 Hypertensive chronic kidney disease with stage 1 through stage 4 chronic kidney disease, or unspecified chronic kidney disease: Secondary | ICD-10-CM | POA: Diagnosis not present

## 2022-12-29 LAB — BASIC METABOLIC PANEL
BUN: 20 (ref 4–21)
CO2: 32 — AB (ref 13–22)
Chloride: 96 — AB (ref 99–108)
Creatinine: 1.4 — AB (ref 0.6–1.3)
Glucose: 149
Potassium: 3.3 mEq/L — AB (ref 3.5–5.1)
Sodium: 136 — AB (ref 137–147)

## 2022-12-29 LAB — COMPREHENSIVE METABOLIC PANEL
Albumin: 3.7 (ref 3.5–5.0)
Calcium: 10.4 (ref 8.7–10.7)
eGFR: 54

## 2022-12-29 LAB — CBC AND DIFFERENTIAL: Hemoglobin: 11.7 — AB (ref 13.5–17.5)

## 2022-12-30 LAB — LAB REPORT - SCANNED
Albumin, Urine POC: 10.1
Creatinine, POC: 91.4 mg/dL
EGFR: 54

## 2022-12-31 ENCOUNTER — Other Ambulatory Visit: Payer: Self-pay | Admitting: Family Medicine

## 2022-12-31 DIAGNOSIS — E876 Hypokalemia: Secondary | ICD-10-CM

## 2023-01-05 ENCOUNTER — Other Ambulatory Visit: Payer: Self-pay | Admitting: Nurse Practitioner

## 2023-01-05 DIAGNOSIS — E0822 Diabetes mellitus due to underlying condition with diabetic chronic kidney disease: Secondary | ICD-10-CM

## 2023-01-07 ENCOUNTER — Ambulatory Visit (INDEPENDENT_AMBULATORY_CARE_PROVIDER_SITE_OTHER): Payer: Medicare Other | Admitting: Family Medicine

## 2023-01-07 ENCOUNTER — Encounter: Payer: Self-pay | Admitting: Family Medicine

## 2023-01-07 VITALS — BP 132/88 | HR 97 | Temp 97.3°F | Resp 18 | Ht 71.0 in | Wt >= 6400 oz

## 2023-01-07 DIAGNOSIS — I10 Essential (primary) hypertension: Secondary | ICD-10-CM | POA: Diagnosis not present

## 2023-01-07 DIAGNOSIS — E349 Endocrine disorder, unspecified: Secondary | ICD-10-CM | POA: Diagnosis not present

## 2023-01-07 DIAGNOSIS — D352 Benign neoplasm of pituitary gland: Secondary | ICD-10-CM | POA: Diagnosis not present

## 2023-01-07 DIAGNOSIS — E1142 Type 2 diabetes mellitus with diabetic polyneuropathy: Secondary | ICD-10-CM

## 2023-01-07 DIAGNOSIS — E1121 Type 2 diabetes mellitus with diabetic nephropathy: Secondary | ICD-10-CM | POA: Diagnosis not present

## 2023-01-07 DIAGNOSIS — Z6841 Body Mass Index (BMI) 40.0 and over, adult: Secondary | ICD-10-CM

## 2023-01-07 DIAGNOSIS — N1831 Chronic kidney disease, stage 3a: Secondary | ICD-10-CM | POA: Diagnosis not present

## 2023-01-07 DIAGNOSIS — E66813 Obesity, class 3: Secondary | ICD-10-CM

## 2023-01-07 MED ORDER — OZEMPIC (0.25 OR 0.5 MG/DOSE) 2 MG/3ML ~~LOC~~ SOPN: 0.2500 mg | PEN_INJECTOR | SUBCUTANEOUS | 1 refills | Status: DC

## 2023-01-07 NOTE — Progress Notes (Signed)
Provider:  Alain Honey, MD  Careteam: Patient Care Team: Wardell Honour, MD as PCP - General (Family Medicine) Elmarie Shiley, MD as Consulting Physician (Nephrology) Sharyne Peach, MD as Consulting Physician (Ophthalmology) Ronald Lobo, MD as Consulting Physician (Gastroenterology)  PLACE OF SERVICE:  [place] Directive information    No Known Allergies  Chief Complaint  Patient presents with   Follow-up    Six Month Follow-up   Quality Metric Gaps    Needs to discuss Shingrix and Covid vaccine, Medicare Annual Wellness, Diabetic Kidney Evaluation. NCIR verifed     HPI: Patient is a 77 y.o. male .  Seems to be doing well by his report.  He is followed by nephrology as well as endocrinology for pituitary tumors type 2 diabetes hyperprolactinemia and chronic kidney disease.  His most recent A1c of the endocrine office was 6.6.  Has not seen eye doctor this year but tells me appointment is imminent.  By history renal function is slightly improved.  Nephrologist has told him he would like to see weight loss and he suggested he go through primary care for that.  Will work continues to swim when pulled his functional.  He would like to try one of the newer weight loss medicines such as Ozempic.  Since he is a diabetic and well-managed I have asked him to contact his endocrinologist to see if he has any reservations about the use of Ozempic. He denies chest pain shortness of breath joint pain or symptoms of neuropathy.  Review of Systems:  Review of Systems  Constitutional: Negative.   Respiratory: Negative.    Cardiovascular: Negative.   Gastrointestinal: Negative.   Genitourinary: Negative.   Neurological: Negative.   Psychiatric/Behavioral: Negative.    All other systems reviewed and are negative.   Past Medical History:  Diagnosis Date   Asymptomatic varicose veins    Chronic kidney disease    Diaphragmatic hernia without mention of obstruction or  gangrene    DM (diabetes mellitus) (HCC)    GERD (gastroesophageal reflux disease)    Gout, unspecified    Hypercalcemia    Hyperlipidemia    Hyperosmolality and/or hypernatremia    Hypertension    Monoclonal paraproteinemia    New daily persistent headache    Osteoarthrosis, unspecified whether generalized or localized, lower leg    Other malaise and fatigue    Primary localized osteoarthrosis, unspecified site    Unspecified disorder of kidney and ureter    Unspecified hereditary and idiopathic peripheral neuropathy    Past Surgical History:  Procedure Laterality Date   BIOPSY  10/19/2018   Procedure: BIOPSY;  Surgeon: Ronald Lobo, MD;  Location: [facility] ENDOSCOPY;  Service: Endoscopy;;   CARPAL TUNNEL RELEASE  2007   COLONOSCOPY N/A 06/06/2013   Procedure: COLONOSCOPY;  Surgeon: Cleotis Nipper, MD;  [facility] END[facility]COPY;  Service: Endoscopy;  Laterality: N/A;   COLONOSCOPY WITH PROPOFOL N/A 10/19/2018   Procedure: COLONOSCOPY WITH PROPOFOL;  Surgeon: Ronald Lobo, MD;  Location: [facility] ENDOSCOPY;  Service: Endoscopy;  Laterality: N/A;   POLYPECTOMY  10/19/2018   Procedure: POLYPECTOMY;  Surgeon: Ronald Lobo, MD;  Location: [facility] ENDOSCOPY;  Service: Endoscopy;;   TUMOR REMOVAL  2017   Pituitary adenoma   Social History:   reports that he has never smoked. He has never used smokeless tobacco. He reports that he does not drink alcohol and does not use drugs.  Family History  Problem Relation Age of Onset   Hypertension Sister  Hypertension Brother    Diabetes Brother    Hypertension Brother    Hypertension Sister    Migraines Neg Hx     Medications: Patient's Medications  New Prescriptions   No medications on file  Previous Medications   ALLOPURINOL (ZYLOPRIM) 100 MG TABLET    Take 1 tablet (100 mg total) by mouth 2 (two) times daily.   AMLODIPINE (NORVASC) 10 MG TABLET    TAKE 1 TABLET DAILY FOR BLOOD PRESSURE   ASPIRIN EC 81 MG TABLET    Take 1 tablet (81 mg total)  by mouth daily.   ATORVASTATIN (LIPITOR) 40 MG TABLET    Take 1 tablet (40 mg total) by mouth daily.   CHLORTHALIDONE (HYGROTON) 25 MG TABLET    TAKE 1 TABLET DAILY FOR BLOOD PRESSURE   CHOLECALCIFEROL (VITAMIN D3) 2000 UNITS TABS    Take 2,000 Units by mouth daily.    FREESTYLE LITE TEST STRIP    CHECK BLOOD SUGAR THREE TIMES A DAY   GABAPENTIN (NEURONTIN) 300 MG CAPSULE    TAKE 1 CAPSULE TWICE A DAY   INSULIN LISPRO (HUMALOG KWIKPEN) 100 UNIT/ML KWIKPEN    INJECT 22 UNITS BEFORE BREAKFAST, 26 UNITS BEFORE LUNCH AND 26 UNITS BEFORE SUPPER PLUS SLIDING SCALE   INSULIN PEN NEEDLE (SURE COMFORT PEN NEEDLES) 32G X 4 MM MISC    Use 4 times daily before meals DX: E11.65   IRBESARTAN (AVAPRO) 300 MG TABLET    TAKE 1 TABLET DAILY   LANTUS SOLOSTAR 100 UNIT/ML SOLOSTAR PEN    INJECT 66 UNITS UNDER THE SKIN DAILY   METHYLPREDNISOLONE (MEDROL DOSEPAK) 4 MG TBPK TABLET    Take as directed   NEOMYCIN-POLYMYXIN B-DEXAMETHASONE (MAXITROL) 3.5-10000-0.1 SUSP    SMARTSIG:In Eye(s)   OMEGA-3 FATTY ACIDS (FISH OIL) 1200 MG CAPS    Take 1,200 mg by mouth daily.    PANTOPRAZOLE (PROTONIX) 40 MG TABLET    TAKE 1 TABLET DAILY   POTASSIUM CHLORIDE SA (KLOR-CON M) 20 MEQ TABLET    TAKE 1 TABLET TWICE A DAY   PREDNISOLONE ACETATE (PRED FORTE) 1 % OPHTHALMIC SUSPENSION    Place 1 drop into the right eye 2 (two) times daily.   TRAMADOL (ULTRAM) 50 MG TABLET    1 tablet as needed dx M1A.30X0 Orally Once a day for 90 days  Modified Medications   No medications on file  Discontinued Medications   No medications on file    Physical Exam:  There were no vitals filed for this visit. There is no height or weight on file to calculate BMI. Wt Readings from Last 3 Encounters:  07/09/22 (!) 404 lb (183.3 kg)  03/05/22 (!) 407 lb (184.6 kg)  10/29/21 (!) 390 lb 6.4 oz (177.1 kg)    Physical Exam Vitals and nursing note reviewed.  Constitutional:      Appearance: He is obese.  Cardiovascular:     Rate and Rhythm:  Normal rate and regular rhythm.  Pulmonary:     Effort: Pulmonary effort is normal.     Breath sounds: Normal breath sounds.  Musculoskeletal:        General: Normal range of motion.  Neurological:     General: No focal deficit present.     Mental Status: He is alert and oriented to person, place, and time.  Psychiatric:        Mood and Affect: Mood normal.        Behavior: Behavior normal.     Labs reviewed: Basic  Metabolic Panel: Recent Labs    03/05/22 0910  NA 139  K 3.6  CL 99  CO2 28  GLUCOSE 121*  BUN 19  CREATININE 1.44*  CALCIUM 10.0   Liver Function Tests: Recent Labs    03/05/22 0910  AST 13  ALT 12  BILITOT 0.3  PROT 7.3   No results for input(s): "LIPASE", "AMYLASE" in the last 8760 hours. No results for input(s): "AMMONIA" in the last 8760 hours. CBC: No results for input(s): "WBC", "NEUTROABS", "HGB", "HCT", "MCV", "PLT" in the last 8760 hours. Lipid Panel: Recent Labs    03/05/22 0910  CHOL 174  HDL 50  LDLCALC 105*  TRIG 91  CHOLHDL 3.5   TSH: No results for input(s): "TSH" in the last 8760 hours. A1C: Lab Results  Component Value Date   HGBA1C 7.5 (H) 02/13/2021     Assessment/Plan  1. Benign neoplasm of pituitary gland (HCC)  Followed by endocrinology 2. Stage 3a chronic kidney disease ([place]) Renal function is stable by history I have ask patient to have nephrology forward recent labs.  Labs from 6 months ago showed creatinine of 1.4 and GFR 51  3. Class 3 severe obesity with serious comorbidity and body mass index (BMI) of 50.0 to 59.9 in adult, unspecified obesity type ([place]) Patient's diet is good.  He exercises when possible.  Will prescribe Ozempic if diabetologist agrees and insurance approves  4. Diabetic peripheral neuropathy associated with type 2 diabetes mellitus (HCC) Takes gabapentin but sporadically  5. Diabetic nephropathy associated with type 2 diabetes mellitus (HCC) Chronic kidney disease stage IIIa,  stable  6. Essential hypertension Blood pressures are good on combination amlodipine chlorthalidone irbesartan  7. Hormone deficiency There is no specific hormone replacement listed on his drug list.  Would expect to see replacement or the like on thyroid and cortisol   Alain Honey, MD [place] [phone]

## 2023-01-14 ENCOUNTER — Telehealth: Payer: Self-pay

## 2023-01-14 DIAGNOSIS — N1832 Chronic kidney disease, stage 3b: Secondary | ICD-10-CM

## 2023-01-14 MED ORDER — TRULICITY 1.5 MG/0.5ML ~~LOC~~ SOAJ: 1.5000 mg | SUBCUTANEOUS | 0 refills | Status: DC

## 2023-01-14 NOTE — Telephone Encounter (Signed)
Incoming call received from patient stating Ozempic is not covered by his insurance company, however trulicty is an alternative in which they will cover.  Patient is asking for a  rx for Trulicity to be sent to [facility].

## 2023-01-28 ENCOUNTER — Ambulatory Visit (INDEPENDENT_AMBULATORY_CARE_PROVIDER_SITE_OTHER): Payer: Medicare Other | Admitting: Podiatry

## 2023-01-28 ENCOUNTER — Encounter: Payer: Self-pay | Admitting: Podiatry

## 2023-01-28 VITALS — BP 145/61

## 2023-01-28 DIAGNOSIS — E1142 Type 2 diabetes mellitus with diabetic polyneuropathy: Secondary | ICD-10-CM

## 2023-01-28 DIAGNOSIS — M79674 Pain in right toe(s): Secondary | ICD-10-CM

## 2023-01-28 DIAGNOSIS — B351 Tinea unguium: Secondary | ICD-10-CM

## 2023-01-28 DIAGNOSIS — M79675 Pain in left toe(s): Secondary | ICD-10-CM

## 2023-01-28 NOTE — Progress Notes (Signed)
  Subjective:  Patient ID: Adrian Matthews, male    DOB: 1946-02-26,  MRN: [id number]  Adrian Matthews presents to clinic today for at risk foot care with history of diabetic neuropathy and painful thick toenails that are difficult to trim. Pain interferes with ambulation. Aggravating factors include wearing enclosed shoe gear. Pain is relieved with periodic professional debridement.  Chief Complaint  Patient presents with   Nail Problem    [facility] BS-forgot his reading A1C-6.6 Adrian Matthews PCP VST-12/2022   New problem(s): None.   PCP is Adrian Kuster, MD.  No Known Allergies  Review of Systems: Negative except as noted in the HPI.  Objective: No changes noted in today's physical examination. Vitals:   01/28/23 0819  BP: (!) 145/61   Adrian Matthews is a pleasant 77 y.o. male morbidly obese in NAD. AAO x 3.  Vascular Examination: Capillary refill time immediate b/l. Vascular status intact b/l with palpable pedal pulses. Pedal hair sparse b/l.  No pain with calf compression b/l. Skin temperature gradient WNL b/l. Nonpitting edema noted BLE.  Neurological Examination: Sensation grossly intact b/l with 10 gram monofilament. Vibratory sensation intact b/l. Pt has subjective symptoms of neuropathy.  Dermatological Examination: Pedal skin with normal turgor, texture and tone b/l.  No open wounds. No interdigital macerations.   Toenails 1-5 b/l thick, discolored, elongated with subungual debris and pain on dorsal palpation.   No hyperkeratotic nor porokeratotic lesions present on today's visit.  Musculoskeletal Examination: Normal muscle strength 5/5 to all lower extremity muscle groups bilaterally. Pes planus deformity noted bilateral LE. No pain, crepitus or joint limitation noted with ROM b/l LE.  Patient ambulates independently without assistive aids.  Radiographs: None  Assessment/Plan: 1. Pain due to onychomycosis of toenails of both feet   2. Diabetic peripheral  neuropathy associated with type 2 diabetes mellitus    -Consent given for treatment as described below: -Examined patient. -Continue foot and shoe inspections daily. Monitor blood glucose per PCP/Endocrinologist's recommendations. -Mycotic toenails 1-5 bilaterally were debrided in length and girth with sterile nail nippers and dremel without incident. -Patient/POA to call should there be question/concern in the interim.   Return in about 3 months (around 04/29/2023).  Adrian Matthews, DPM

## 2023-01-30 ENCOUNTER — Other Ambulatory Visit: Payer: Self-pay

## 2023-01-30 DIAGNOSIS — E1122 Type 2 diabetes mellitus with diabetic chronic kidney disease: Secondary | ICD-10-CM

## 2023-01-30 MED ORDER — OZEMPIC (0.25 OR 0.5 MG/DOSE) 2 MG/3ML ~~LOC~~ SOPN
0.2500 mg | PEN_INJECTOR | SUBCUTANEOUS | 1 refills | Status: DC
Start: 2023-01-30 — End: 2023-05-05

## 2023-02-10 ENCOUNTER — Other Ambulatory Visit: Payer: Self-pay | Admitting: Family Medicine

## 2023-02-19 ENCOUNTER — Other Ambulatory Visit: Payer: Self-pay | Admitting: Family Medicine

## 2023-03-11 ENCOUNTER — Other Ambulatory Visit: Payer: Self-pay | Admitting: Family Medicine

## 2023-03-11 DIAGNOSIS — K219 Gastro-esophageal reflux disease without esophagitis: Secondary | ICD-10-CM

## 2023-04-29 ENCOUNTER — Ambulatory Visit (INDEPENDENT_AMBULATORY_CARE_PROVIDER_SITE_OTHER): Payer: Medicare Other | Admitting: Podiatry

## 2023-04-29 ENCOUNTER — Encounter: Payer: Self-pay | Admitting: Podiatry

## 2023-04-29 VITALS — BP 155/72

## 2023-04-29 DIAGNOSIS — B351 Tinea unguium: Secondary | ICD-10-CM | POA: Diagnosis not present

## 2023-04-29 DIAGNOSIS — M79675 Pain in left toe(s): Secondary | ICD-10-CM

## 2023-04-29 DIAGNOSIS — M79674 Pain in right toe(s): Secondary | ICD-10-CM

## 2023-04-29 DIAGNOSIS — E1142 Type 2 diabetes mellitus with diabetic polyneuropathy: Secondary | ICD-10-CM

## 2023-04-29 NOTE — Progress Notes (Signed)
  Subjective:  Patient ID: Adrian Matthews, male    DOB: 1946-01-25,  MRN: [id number]  Adrian Matthews presents to clinic today for: at risk foot care. Pt has h/o NIDDM with chronic kidney disease and painful elongated mycotic toenails 1-5 bilaterally which are tender when wearing enclosed shoe gear. Pain is relieved with periodic professional debridement.  Chief Complaint  Patient presents with   Nail Problem    DFC,Referring Provider [name], MD,LOv:03/24,A1C:7.5      PCP is [name], MD. LOV was 01/07/2023.  No Known Allergies  Review of Systems: Negative except as noted in the HPI.  Objective: No changes noted in today's physical examination. Vitals:   04/29/23 0836  BP: (!) 155/72   Adrian Matthews is a pleasant 77 y.o. male, morbidly obese, in NAD. AAO x 3.  Vascular Examination: Capillary refill time <3 seconds b/l LE. Palpable pedal pulses b/l LE. Digital hair present b/l. No pedal edema b/l. Skin temperature gradient WNL b/l. No varicosities b/l. No cyanosis or clubbing noted b/l LE.Marland Kitchen  Dermatological Examination: Pedal skin with normal turgor, texture and tone b/l. No open wounds. No interdigital macerations b/l. Toenails 1-5 b/l thickened, discolored, dystrophic with subungual debris. There is pain on palpation to dorsal aspect of nailplates. No hyperkeratotic nor porokeratotic lesions present on today's visit.Marland Kitchen  Neurological Examination: Protective sensation intact with 10 gram monofilament b/l LE. Pt has subjective symptoms of neuropathy.  Musculoskeletal Examination: Normal muscle strength 5/5 to all lower extremity muscle groups bilaterally. Pes planus deformity noted bilateral LE.Marland Kitchen No pain, crepitus or joint limitation noted with ROM b/l LE.  Patient ambulates independently without assistive aids.  Assessment/Plan: 1. Pain due to onychomycosis of toenails of both feet   2. Diabetic peripheral neuropathy associated with type 2 diabetes mellitus  (HCC)    Patient was evaluated and treated. All patient's and/or POA's questions/concerns addressed on today's visit. Toenails 1-5 debrided in length and girth without incident. Continue soft, supportive shoe gear daily. Report any pedal injuries to medical professional. Call office if there are any questions/concerns. -Patient/POA to call should there be question/concern in the interim.   Return in about 3 months (around 07/30/2023).  [name], DPM

## 2023-05-04 ENCOUNTER — Other Ambulatory Visit: Payer: Self-pay | Admitting: Family Medicine

## 2023-05-04 NOTE — Telephone Encounter (Signed)
Pharmacy requesting multiple refills.

## 2023-05-05 ENCOUNTER — Other Ambulatory Visit: Payer: Self-pay

## 2023-05-05 DIAGNOSIS — N182 Chronic kidney disease, stage 2 (mild): Secondary | ICD-10-CM

## 2023-05-05 MED ORDER — INSULIN LISPRO (1 UNIT DIAL) 100 UNIT/ML (KWIKPEN)
PEN_INJECTOR | SUBCUTANEOUS | 3 refills | Status: DC
Start: 2023-05-05 — End: 2024-03-15

## 2023-06-23 DIAGNOSIS — I129 Hypertensive chronic kidney disease with stage 1 through stage 4 chronic kidney disease, or unspecified chronic kidney disease: Secondary | ICD-10-CM | POA: Diagnosis not present

## 2023-06-23 DIAGNOSIS — Z794 Long term (current) use of insulin: Secondary | ICD-10-CM | POA: Diagnosis not present

## 2023-06-23 DIAGNOSIS — D126 Benign neoplasm of colon, unspecified: Secondary | ICD-10-CM | POA: Diagnosis not present

## 2023-06-23 DIAGNOSIS — N1831 Chronic kidney disease, stage 3a: Secondary | ICD-10-CM | POA: Diagnosis not present

## 2023-06-23 DIAGNOSIS — E291 Testicular hypofunction: Secondary | ICD-10-CM | POA: Diagnosis not present

## 2023-06-23 DIAGNOSIS — E1129 Type 2 diabetes mellitus with other diabetic kidney complication: Secondary | ICD-10-CM | POA: Diagnosis not present

## 2023-06-23 DIAGNOSIS — M1A30X0 Chronic gout due to renal impairment, unspecified site, without tophus (tophi): Secondary | ICD-10-CM | POA: Diagnosis not present

## 2023-06-23 DIAGNOSIS — N2581 Secondary hyperparathyroidism of renal origin: Secondary | ICD-10-CM | POA: Diagnosis not present

## 2023-06-23 DIAGNOSIS — E785 Hyperlipidemia, unspecified: Secondary | ICD-10-CM | POA: Diagnosis not present

## 2023-06-23 DIAGNOSIS — D352 Benign neoplasm of pituitary gland: Secondary | ICD-10-CM | POA: Diagnosis not present

## 2023-06-30 ENCOUNTER — Ambulatory Visit: Payer: Medicare Other | Admitting: Podiatry

## 2023-07-02 DIAGNOSIS — N1831 Chronic kidney disease, stage 3a: Secondary | ICD-10-CM | POA: Diagnosis not present

## 2023-07-02 DIAGNOSIS — N2581 Secondary hyperparathyroidism of renal origin: Secondary | ICD-10-CM | POA: Diagnosis not present

## 2023-07-02 DIAGNOSIS — D631 Anemia in chronic kidney disease: Secondary | ICD-10-CM | POA: Diagnosis not present

## 2023-07-02 DIAGNOSIS — N189 Chronic kidney disease, unspecified: Secondary | ICD-10-CM | POA: Diagnosis not present

## 2023-07-02 DIAGNOSIS — I129 Hypertensive chronic kidney disease with stage 1 through stage 4 chronic kidney disease, or unspecified chronic kidney disease: Secondary | ICD-10-CM | POA: Diagnosis not present

## 2023-07-02 LAB — COMPREHENSIVE METABOLIC PANEL
Albumin: 3.8 (ref 3.5–5.0)
Calcium: 9.9 (ref 8.7–10.7)

## 2023-07-02 LAB — BASIC METABOLIC PANEL
BUN: 23 — AB (ref 4–21)
CO2: 27 — AB (ref 13–22)
Chloride: 99 (ref 99–108)
Creatinine: 1.7 — AB (ref 0.6–1.3)
Glucose: 104
Potassium: 3.4 mEq/L — AB (ref 3.5–5.1)
Sodium: 139 (ref 137–147)

## 2023-07-02 LAB — CBC: RBC: 4.92 (ref 3.87–5.11)

## 2023-07-02 LAB — CBC AND DIFFERENTIAL
HCT: 37 — AB (ref 41–53)
Hemoglobin: 12 — AB (ref 13.5–17.5)
Neutrophils Absolute: 5.4
Platelets: 234 10*3/uL (ref 150–400)
WBC: 13

## 2023-07-02 LAB — PROTEIN / CREATININE RATIO, URINE: Creatinine, Urine: 111.8

## 2023-07-02 LAB — VITAMIN D 25 HYDROXY (VIT D DEFICIENCY, FRACTURES): Vit D, 25-Hydroxy: 69.1

## 2023-07-15 ENCOUNTER — Encounter: Payer: Self-pay | Admitting: Sports Medicine

## 2023-07-15 ENCOUNTER — Ambulatory Visit (INDEPENDENT_AMBULATORY_CARE_PROVIDER_SITE_OTHER): Payer: Medicare Other | Admitting: Sports Medicine

## 2023-07-15 VITALS — BP 156/80 | HR 87 | Temp 96.6°F | Resp 18 | Ht 71.0 in | Wt >= 6400 oz

## 2023-07-15 DIAGNOSIS — E1122 Type 2 diabetes mellitus with diabetic chronic kidney disease: Secondary | ICD-10-CM | POA: Diagnosis not present

## 2023-07-15 DIAGNOSIS — E66813 Obesity, class 3: Secondary | ICD-10-CM

## 2023-07-15 DIAGNOSIS — Z23 Encounter for immunization: Secondary | ICD-10-CM

## 2023-07-15 DIAGNOSIS — D352 Benign neoplasm of pituitary gland: Secondary | ICD-10-CM

## 2023-07-15 DIAGNOSIS — Z794 Long term (current) use of insulin: Secondary | ICD-10-CM | POA: Diagnosis not present

## 2023-07-15 DIAGNOSIS — Z6841 Body Mass Index (BMI) 40.0 and over, adult: Secondary | ICD-10-CM | POA: Diagnosis not present

## 2023-07-15 DIAGNOSIS — N1832 Chronic kidney disease, stage 3b: Secondary | ICD-10-CM | POA: Diagnosis not present

## 2023-07-15 DIAGNOSIS — I10 Essential (primary) hypertension: Secondary | ICD-10-CM

## 2023-07-15 DIAGNOSIS — N1831 Chronic kidney disease, stage 3a: Secondary | ICD-10-CM | POA: Diagnosis not present

## 2023-07-15 MED ORDER — FREESTYLE LIBRE 3 PLUS SENSOR MISC: 11 refills | Status: DC

## 2023-07-15 MED ORDER — FREESTYLE LIBRE 3 READER DEVI: 1.0000 | Freq: Every day | 12 refills | Status: DC

## 2023-07-15 NOTE — Progress Notes (Signed)
Careteam: Patient Care Team: Venita Sheffield, MD as PCP - General (Internal Medicine) Dagoberto Ligas, MD as Consulting Physician (Nephrology) Elise Benne, MD as Consulting Physician (Ophthalmology) Bernette Redbird, MD as Consulting Physician (Gastroenterology)  PLACE OF SERVICE:  [facility]  Advanced Directive information Does Patient Have a Medical Advance Directive?: No, Would patient like information on creating a medical advance directive?: No - Patient declined  No Known Allergies  Chief Complaint  Patient presents with   Medical Management of Chronic Issues    6 month follow up.    Health Maintenance    Discuss the need for Hemoglobin A1C.   Immunizations    Discuss the need for Shingrix vaccine, DTAP vaccine, and Covid Booster.      HPI: Patient is a 77 y.o. male is here for follow up  Has no concerns  Lives alone Independent with ADLS and IADLS   HTN  Checks bp at home ocassionaly  Took his meds Compliant with his meds   DM  Lab Results  Component Value Date   HGBA1C 7.5 (H) 02/13/2021   HGBA1C 7.7 (H) 10/16/2020   HGBA1C 7.5 (H) 05/29/2020   Has eye appt in 2 weeks  Follows with podiatry  On humalog , ozempic and lantus  Denies hypoglycemia  Checks 3 times daily   Obesity  Swims 3 times/ week  Denies snoring, fatigue , day time sleepiness On ozempic    HLD  On lipitor   Pituitary tumor Follows with endocrinology  Had recent labs wnl  Knee pain bilateral  Does not take tylenol     Review of Systems:  Review of Systems  Constitutional:  Negative for chills and fever.  HENT:  Negative for congestion and sore throat.   Eyes:  Negative for double vision.  Respiratory:  Negative for cough, sputum production and shortness of breath.   Cardiovascular:  Negative for chest pain, palpitations and leg swelling.  Gastrointestinal:  Negative for abdominal pain, heartburn and nausea.  Genitourinary:  Negative for dysuria, frequency and  hematuria.  Musculoskeletal:  Positive for joint pain. Negative for falls and myalgias.  Neurological:  Negative for dizziness, sensory change and focal weakness.    Past Medical History:  Diagnosis Date   Asymptomatic varicose veins    Chronic kidney disease    Diaphragmatic hernia without mention of obstruction or gangrene    DM (diabetes mellitus) (HCC)    GERD (gastroesophageal reflux disease)    Gout, unspecified    Hypercalcemia    Hyperlipidemia    Hyperosmolality and/or hypernatremia    Hypertension    Monoclonal paraproteinemia    New daily persistent headache    Osteoarthrosis, unspecified whether generalized or localized, lower leg    Other malaise and fatigue    Primary localized osteoarthrosis, unspecified site    Unspecified disorder of kidney and ureter    Unspecified hereditary and idiopathic peripheral neuropathy    Past Surgical History:  Procedure Laterality Date   BIOPSY  10/19/2018   Procedure: BIOPSY;  Surgeon: Bernette Redbird, MD;  Location: [facility] ENDOSCOPY;  Service: Endoscopy;;   CARPAL TUNNEL RELEASE  2007   COLONOSCOPY N/A 06/06/2013   Procedure: COLONOSCOPY;  Surgeon: Florencia Reasons, MD;  Location: [facility] ENDOSCOPY;  Service: Endoscopy;  Laterality: N/A;   COLONOSCOPY WITH PROPOFOL N/A 10/19/2018   Procedure: COLONOSCOPY WITH PROPOFOL;  Surgeon: Bernette Redbird, MD;  Location: [facility];  Service: Endoscopy;  Laterality: N/A;   POLYPECTOMY  10/19/2018   Procedure: POLYPECTOMY;  Surgeon: Bernette Redbird, MD;  Location: [facility];  Service: Endoscopy;;   TUMOR REMOVAL  2017   Pituitary adenoma   Social History:   reports that he has never smoked. He has never used smokeless tobacco. He reports that he does not drink alcohol and does not use drugs.  Family History  Problem Relation Age of Onset   Hypertension Sister    Hypertension Brother    Diabetes Brother    Hypertension Brother    Hypertension Sister    Migraines Neg Hx      Medications: Patient's Medications  New Prescriptions   No medications on file  Previous Medications   ALLOPURINOL (ZYLOPRIM) 100 MG TABLET    TAKE 1 TABLET TWICE A DAY   AMLODIPINE (NORVASC) 10 MG TABLET    TAKE 1 TABLET DAILY FOR BLOOD PRESSURE   ASPIRIN EC 81 MG TABLET    Take 1 tablet (81 mg total) by mouth daily.   ATORVASTATIN (LIPITOR) 40 MG TABLET    TAKE 1 TABLET DAILY   CHLORTHALIDONE (HYGROTON) 25 MG TABLET    TAKE 1 TABLET DAILY FOR BLOOD PRESSURE   CHOLECALCIFEROL (VITAMIN D3) 2000 UNITS TABS    Take 2,000 Units by mouth daily.    FREESTYLE LITE TEST STRIP    CHECK BLOOD SUGAR THREE TIMES A DAY   GABAPENTIN (NEURONTIN) 300 MG CAPSULE    TAKE 1 CAPSULE TWICE A DAY   INSULIN LISPRO (HUMALOG KWIKPEN) 100 UNIT/ML KWIKPEN    INJECT 22 UNITS BEFORE BREAKFAST, 26 UNITS BEFORE LUNCH AND 26 UNITS BEFORE SUPPER PLUS SLIDING SCALE   INSULIN PEN NEEDLE (SURE COMFORT PEN NEEDLES) 32G X 4 MM MISC    Use 4 times daily before meals DX: E11.65   IRBESARTAN (AVAPRO) 300 MG TABLET    TAKE 1 TABLET DAILY   LANTUS SOLOSTAR 100 UNIT/ML SOLOSTAR PEN    INJECT 66 UNITS UNDER THE SKIN DAILY   METHYLPREDNISOLONE (MEDROL DOSEPAK) 4 MG TBPK TABLET    Take as directed   NEOMYCIN-POLYMYXIN B-DEXAMETHASONE (MAXITROL) 3.5-10000-0.1 SUSP    SMARTSIG:In Eye(s)   OMEGA-3 FATTY ACIDS (FISH OIL) 1200 MG CAPS    Take 1,200 mg by mouth daily.    OZEMPIC, 0.25 OR 0.5 MG/DOSE, 2 MG/3ML SOPN    INJECT 0.25 MG UNDER THE SKIN ONCE A WEEK   PANTOPRAZOLE (PROTONIX) 40 MG TABLET    TAKE 1 TABLET DAILY   POTASSIUM CHLORIDE SA (KLOR-CON M) 20 MEQ TABLET    TAKE 1 TABLET TWICE A DAY   PREDNISOLONE ACETATE (PRED FORTE) 1 % OPHTHALMIC SUSPENSION    Place 1 drop into the right eye 2 (two) times daily.   TRAMADOL (ULTRAM) 50 MG TABLET    1 tablet as needed dx M1A.30X0 Orally Once a day for 90 days  Modified Medications   No medications on file  Discontinued Medications   No medications on file    Physical  Exam:  Vitals:   07/15/23 0827  BP: (!) 156/80  Pulse: 87  Resp: 18  Temp: (!) 96.6 F (35.9 C)  SpO2: 94%  Weight: (!) 405 lb 9.6 oz (184 kg)  Height: 5\' 11"  (1.803 m)   Body mass index is 56.57 kg/m. Wt Readings from Last 3 Encounters:  07/15/23 (!) 405 lb 9.6 oz (184 kg)  01/07/23 (!) 407 lb 4 oz (184.7 kg)  07/09/22 (!) 404 lb (183.3 kg)    Physical Exam Constitutional:      Appearance: Normal appearance.  HENT:  Head: Normocephalic and atraumatic.  Cardiovascular:     Rate and Rhythm: Normal rate and regular rhythm.     Pulses: Normal pulses.     Heart sounds: Normal heart sounds.  Pulmonary:     Effort: No respiratory distress.     Breath sounds: No stridor. No wheezing or rales.  Abdominal:     General: Bowel sounds are normal. There is no distension.     Palpations: Abdomen is soft.     Tenderness: There is no abdominal tenderness. There is no right CVA tenderness or guarding.  Neurological:     Mental Status: He is alert. Mental status is at baseline.     Sensory: No sensory deficit.     Motor: No weakness.      Labs reviewed: Basic Metabolic Panel: Recent Labs    12/29/22 0000 07/02/23 0000  NA 136* 139  K 3.3* 3.4*  CL 96* 99  CO2 32* 27*  BUN 20 23*  CREATININE 1.4* 1.7*  CALCIUM 10.4 9.9   Liver Function Tests: Recent Labs    12/29/22 0000 07/02/23 0000  ALBUMIN 3.7 3.8   No results for input(s): "LIPASE", "AMYLASE" in the last 8760 hours. No results for input(s): "AMMONIA" in the last 8760 hours. CBC: Recent Labs    12/29/22 0000 07/02/23 0000  WBC  --  13.0  NEUTROABS  --  5.40  HGB 11.7* 12.0*  HCT  --  37*  PLT  --  234   Lipid Panel: No results for input(s): "CHOL", "HDL", "LDLCALC", "TRIG", "CHOLHDL", "LDLDIRECT" in the last 8760 hours. TSH: No results for input(s): "TSH" in the last 8760 hours. A1C: Lab Results  Component Value Date   HGBA1C 7.5 (H) 02/13/2021     Assessment/Plan 1. Need for influenza  vaccination   - Flu Vaccine Trivalent High Dose (Fluad)     2. Type 2 diabetes mellitus with stage 3b chronic kidney disease, with long-term current use of insulin (HCC)  At goal  Cont with the same Will order cgm Monitor for hypoglycemia Exercise regularly  Collection Date 06/23/2023 12/18/2022 06/30/2022 06/24/2021 12/20/2020 06/22/2020  Collection Time 09:16 AM 09:13 AM 09:10 AM 09:11 AM   10:36 AM  Order Date 06/23/2023 12/18/2022 06/30/2022 06/24/2021 12/20/2020 06/22/2020  A1C 6.1 6.6 6.3 6.6% 7.3 6.7    3. Class 3 severe obesity with serious comorbidity and body mass index (BMI) of 50.0 to 59.9 in adult, unspecified obesity type (HCC)  Cont with regular exercises  30 min/5 times a week  4. Stage 3a chronic kidney disease (HCC)        CREATININE 1.4   0.3 - 1.5 - mg/dl    5. Benign neoplasm of pituitary gland (HCC)  Stable   6. Essential hypertension  At goal  Cont with the same  Other orders - Continuous Glucose Receiver (FREESTYLE LIBRE 3 READER) DEVI; 1 Device by Does not apply route daily. E11.9  Dispense: 1 each; Refill: 12 - Continuous Glucose Sensor (FREESTYLE LIBRE 3 PLUS SENSOR) MISC; Change sensor every 15 days. E11.9  Dispense: 2 each; Refill: 11   No follow-ups on file.: 4 months

## 2023-07-16 ENCOUNTER — Telehealth: Payer: Self-pay

## 2023-07-16 NOTE — Telephone Encounter (Signed)
Incoming fax received from [facility] titled "Therapy Review," form placed on Dr.Veludandi's desk in review and sign folder

## 2023-07-17 ENCOUNTER — Telehealth: Payer: Self-pay

## 2023-07-17 DIAGNOSIS — Z794 Long term (current) use of insulin: Secondary | ICD-10-CM

## 2023-07-17 NOTE — Telephone Encounter (Signed)
Message left on clinical intake voicemail:   Because PA was not completed now the provider will have to start the process over again

## 2023-07-27 NOTE — Telephone Encounter (Signed)
Patient call this afternoon to give an update on the Freestyle [place] from [employer] that they are out of stock.  Please advise   Message sent to Venita Sheffield, MD

## 2023-07-28 ENCOUNTER — Telehealth: Payer: Self-pay

## 2023-07-28 DIAGNOSIS — H52201 Unspecified astigmatism, right eye: Secondary | ICD-10-CM | POA: Diagnosis not present

## 2023-07-28 DIAGNOSIS — H2513 Age-related nuclear cataract, bilateral: Secondary | ICD-10-CM | POA: Diagnosis not present

## 2023-07-28 DIAGNOSIS — E119 Type 2 diabetes mellitus without complications: Secondary | ICD-10-CM | POA: Diagnosis not present

## 2023-07-28 MED ORDER — ACCU-CHEK AVIVA PLUS W/DEVICE KIT: 1.0000 | PACK | Freq: Two times a day (BID) | 0 refills | Status: AC

## 2023-07-28 MED ORDER — ACCU-CHEK AVIVA PLUS VI STRP: ORAL_STRIP | 12 refills | Status: DC

## 2023-07-28 MED ORDER — ACCU-CHEK FASTCLIX LANCET KIT: 1.0000 | PACK | Freq: Two times a day (BID) | 0 refills | Status: AC

## 2023-07-28 MED ORDER — DEXCOM G6 RECEIVER DEVI
1.0000 | Freq: Every day | 11 refills | Status: AC
Start: 2023-07-28 — End: ?

## 2023-07-28 MED ORDER — DEXCOM G6 SENSOR MISC
1.0000 | Freq: Every day | 11 refills | Status: AC
Start: 2023-07-28 — End: ?

## 2023-07-28 NOTE — Telephone Encounter (Signed)
Filled out prior authorization for glucose testing supplies, form place in DR.Veludandi's review and sign folder.

## 2023-07-28 NOTE — Telephone Encounter (Signed)
Per Venita Sheffield, MD patient needs to contact the insurance company and find out which meter they will cover and let us know and we will submit rx.   Spoke with patient and he asked if we could send in a rx for the new blood sugar meter that resembles a pulse o2, patient seen on Facebook, patient did not know the name of meter and then stated we can just send in for any alternative as he did not have a preference.

## 2023-07-28 NOTE — Addendum Note (Signed)
Addended by: Venita Sheffield on: 07/28/2023 12:42 PM   Modules accepted: Orders

## 2023-07-29 ENCOUNTER — Other Ambulatory Visit: Payer: Self-pay

## 2023-07-29 MED ORDER — DEXCOM G6 TRANSMITTER MISC: 1.0000 | Freq: Every day | 4 refills | Status: DC

## 2023-07-29 NOTE — Telephone Encounter (Signed)
Adrian Matthews w/ [facility] prior authorization called to request a rx send in for [employer] G6 transmitter. She reports that the receiver and sensor was approved but patient would need the transmitter as well. If need to discuss with a rep can call [phone].

## 2023-08-04 ENCOUNTER — Ambulatory Visit (INDEPENDENT_AMBULATORY_CARE_PROVIDER_SITE_OTHER): Payer: Medicare Other | Admitting: Podiatry

## 2023-08-04 ENCOUNTER — Encounter: Payer: Self-pay | Admitting: Podiatry

## 2023-08-04 VITALS — Ht 71.0 in | Wt >= 6400 oz

## 2023-08-04 DIAGNOSIS — B351 Tinea unguium: Secondary | ICD-10-CM

## 2023-08-04 DIAGNOSIS — M79674 Pain in right toe(s): Secondary | ICD-10-CM

## 2023-08-04 DIAGNOSIS — E1142 Type 2 diabetes mellitus with diabetic polyneuropathy: Secondary | ICD-10-CM

## 2023-08-04 DIAGNOSIS — M79675 Pain in left toe(s): Secondary | ICD-10-CM | POA: Diagnosis not present

## 2023-08-04 NOTE — Progress Notes (Signed)
Subjective:  Patient ID: Adrian Matthews, male    DOB: [birth year],  MRN: [id number]  77 y.o. male presents at risk foot care with history of diabetic neuropathy  Chief Complaint  Patient presents with   Diabetes    Pt is here for dfc, his last A1C was in September, PCP is Dr Elvis Coil, and his last visit was 07/15/23   New problem(s): None   PCP is Venita Sheffield, MD.  No Known Allergies  Review of Systems: Negative except as noted in the HPI.   Objective:  Kayven Corbeil is a pleasant 77 y.o. male morbidly obese in NAD.Marland Kitchen AAO x 3.  Vascular Examination: Vascular status intact b/l with palpable pedal pulses. CFT immediate b/l. Pedal hair present. No edema. No pain with calf compression b/l. Skin temperature gradient WNL b/l. No varicosities noted. No cyanosis or clubbing noted.  Neurological Examination: Sensation grossly intact b/l with 10 gram monofilament. Vibratory sensation intact b/l.  Dermatological Examination: Pedal skin with normal turgor, texture and tone b/l. No open wounds nor interdigital macerations noted. Toenails 1-5 b/l thick, discolored, elongated with subungual debris and pain on dorsal palpation. No hyperkeratotic lesions noted b/l.   Musculoskeletal Examination: Muscle strength 5/5 to b/l LE.  No pain, crepitus noted b/l. No gross pedal deformities. Patient ambulates independently without assistive aids.   Radiographs: None   Assessment:   1. Pain due to onychomycosis of toenails of both feet   2. Diabetic peripheral neuropathy associated with type 2 diabetes mellitus (HCC)    Plan:  -Consent given for treatment as described below: -Examined patient. -Continue foot and shoe inspections daily. Monitor blood glucose per PCP/Endocrinologist's recommendations. -Continue supportive shoe gear daily. -Mycotic toenails 1-5 bilaterally were debrided in length and girth with sterile nail nippers and dremel without incident. -Patient/POA to call should  there be question/concern in the interim.  Return in about 3 months (around 11/04/2023).  [name], DPM

## 2023-08-11 ENCOUNTER — Other Ambulatory Visit: Payer: Self-pay | Admitting: Family Medicine

## 2023-08-11 DIAGNOSIS — K219 Gastro-esophageal reflux disease without esophagitis: Secondary | ICD-10-CM

## 2023-08-12 ENCOUNTER — Other Ambulatory Visit: Payer: Self-pay | Admitting: Family Medicine

## 2023-08-25 ENCOUNTER — Other Ambulatory Visit: Payer: Self-pay | Admitting: Family Medicine

## 2023-08-25 DIAGNOSIS — I10 Essential (primary) hypertension: Secondary | ICD-10-CM

## 2023-09-02 ENCOUNTER — Other Ambulatory Visit: Payer: Self-pay | Admitting: Family Medicine

## 2023-10-01 ENCOUNTER — Other Ambulatory Visit: Payer: Self-pay | Admitting: Family Medicine

## 2023-10-20 ENCOUNTER — Other Ambulatory Visit: Payer: Self-pay | Admitting: Family Medicine

## 2023-11-03 ENCOUNTER — Ambulatory Visit (INDEPENDENT_AMBULATORY_CARE_PROVIDER_SITE_OTHER): Payer: Medicare Other | Admitting: Podiatry

## 2023-11-03 ENCOUNTER — Encounter: Payer: Self-pay | Admitting: Podiatry

## 2023-11-03 DIAGNOSIS — E1142 Type 2 diabetes mellitus with diabetic polyneuropathy: Secondary | ICD-10-CM

## 2023-11-03 DIAGNOSIS — B351 Tinea unguium: Secondary | ICD-10-CM

## 2023-11-03 DIAGNOSIS — M2142 Flat foot [pes planus] (acquired), left foot: Secondary | ICD-10-CM | POA: Diagnosis not present

## 2023-11-03 DIAGNOSIS — M2141 Flat foot [pes planus] (acquired), right foot: Secondary | ICD-10-CM

## 2023-11-03 DIAGNOSIS — M79674 Pain in right toe(s): Secondary | ICD-10-CM

## 2023-11-03 DIAGNOSIS — M79675 Pain in left toe(s): Secondary | ICD-10-CM | POA: Diagnosis not present

## 2023-11-08 NOTE — Progress Notes (Signed)
  Subjective:  Patient ID: Adrian Matthews, male    DOB: November 26, 1945,  MRN: [id number]  Adrian Matthews presents to clinic today for at risk foot care with history of diabetic neuropathy and painful elongated mycotic toenails 1-5 bilaterally which are tender when wearing enclosed shoe gear. Pain is relieved with periodic professional debridement.  Chief Complaint  Patient presents with   Diabetes    Patient last saw PCP 5 months ago , patient A1c is 6.3 . Patient PCP is Veludandi, Prashanthi .   New problem(s): None.   PCP is Venita Sheffield, MD.  No Known Allergies  Review of Systems: Negative except as noted in the HPI.  Objective: No changes noted in today's physical examination. There were no vitals filed for this visit. Adrian Matthews is a pleasant 78 y.o. male morbidly obese in NAD. AAO x 3.  Vascular Examination: Capillary refill time immediate b/l. Vascular status intact b/l with palpable pedal pulses. Pedal hair present b/l. No pain with calf compression b/l. Skin temperature gradient WNL b/l. No cyanosis or clubbing b/l. No ischemia or gangrene noted b/l.   Neurological Examination: Sensation grossly intact b/l with 10 gram monofilament. Vibratory sensation intact b/l. Pt has subjective symptoms of neuropathy.  Dermatological Examination: Pedal skin with normal turgor, texture and tone b/l.  No open wounds. No interdigital macerations.   Toenails 1-5 b/l thick, discolored, elongated with subungual debris and pain on dorsal palpation.   No corns, calluses nor porokeratotic lesions noted.  Musculoskeletal Examination: Muscle strength 5/5 to all lower extremity muscle groups bilaterally. Pes planus deformity noted bilateral LE. Patient ambulates independent of any assistive aids.  Radiographs: None  Assessment/Plan: 1. Pain due to onychomycosis of toenails of both feet   2. Pes planus of both feet   3. Diabetic peripheral neuropathy associated with type 2 diabetes  mellitus (HCC)    Orders Placed This Encounter  Procedures   For Home Use Only DME Diabetic Shoe    Dispense one pair extra depth shoe and 3 pair total contact insoles.    Patient was evaluated and treated. All patient's and/or POA's questions/concerns addressed on today's visit. Toenails 1-5 debrided in length and girth without incident. Continue soft, supportive shoe gear daily. Report any pedal injuries to medical professional. Call office if there are any questions/concerns. -Continue foot and shoe inspections daily. Monitor blood glucose per PCP/Endocrinologist's recommendations. -Discussed diabetic shoe benefit available based on patient's diagnoses. Patient/POA would like to proceed. Order entered for one pair extra depth shoes and 3 pair total contact insoles. Patient qualifies based on diagnoses. -Patient/POA to call should there be question/concern in the interim.   Return in about 3 months (around 02/01/2024).  [name], DPM      [place] LOCATION: [address] [place] [address]                   Office [phone]   [facility] LOCATION: [address] [place], [place] [address] Office [phone]

## 2023-11-17 ENCOUNTER — Ambulatory Visit (INDEPENDENT_AMBULATORY_CARE_PROVIDER_SITE_OTHER): Payer: Medicare Other | Admitting: Sports Medicine

## 2023-11-17 ENCOUNTER — Encounter: Payer: Self-pay | Admitting: Sports Medicine

## 2023-11-17 VITALS — BP 142/68 | HR 78 | Temp 97.9°F | Resp 20 | Ht 71.0 in | Wt >= 6400 oz

## 2023-11-17 DIAGNOSIS — E1169 Type 2 diabetes mellitus with other specified complication: Secondary | ICD-10-CM | POA: Diagnosis not present

## 2023-11-17 DIAGNOSIS — M1A00X0 Idiopathic chronic gout, unspecified site, without tophus (tophi): Secondary | ICD-10-CM | POA: Diagnosis not present

## 2023-11-17 DIAGNOSIS — N1832 Chronic kidney disease, stage 3b: Secondary | ICD-10-CM

## 2023-11-17 DIAGNOSIS — M159 Polyosteoarthritis, unspecified: Secondary | ICD-10-CM | POA: Diagnosis not present

## 2023-11-17 DIAGNOSIS — I10 Essential (primary) hypertension: Secondary | ICD-10-CM

## 2023-11-17 DIAGNOSIS — E785 Hyperlipidemia, unspecified: Secondary | ICD-10-CM

## 2023-11-17 DIAGNOSIS — Z794 Long term (current) use of insulin: Secondary | ICD-10-CM

## 2023-11-17 DIAGNOSIS — E1122 Type 2 diabetes mellitus with diabetic chronic kidney disease: Secondary | ICD-10-CM

## 2023-11-17 NOTE — Progress Notes (Addendum)
 Careteam: Patient Care Team: Sherlynn Madden, MD as PCP - General (Internal Medicine) Tobie Gordy POUR, MD as Consulting Physician (Nephrology) Robinson Idol, MD as Consulting Physician (Ophthalmology) Donnald Charleston, MD as Consulting Physician (Gastroenterology)  PLACE OF SERVICE:  [facility]  Advanced Directive information Does Patient Have a Medical Advance Directive?: Yes, Type of Advance Directive: Healthcare Power of [place];Living will, Does patient want to make changes to medical advance directive?: No - Patient declined  No Known Allergies  Chief Complaint  Patient presents with   Medical Management of Chronic Issues    4 month follow up and discuss eye exam,shingles,tdap and covid vaccines.      Discussed the use of AI scribe software for clinical note transcription with the patient, who gave verbal consent to proceed.  History of Present Illness   Adrian Matthews is a 78 year old male with hypertension and diabetes who presents for routine follow-up.  He is on amlodipine  10 mg, chlorthalidone , and irbesartan  for hypertension. Blood pressure readings at home are generally lower than those recorded in the clinic. No recent changes in diet, specifically avoiding salty or fried foods, and engages in swimming three times a week. No chest pain, shortness of breath, or palpitations during exercise.  Diabetes is managed with Lantus  insulin , taking 66 units at night, and Lispro insulin , taking 22 units before breakfast, 22 units before lunch, and 26 units before dinner. Checks Blood sugars three to four times daily, with readings typically around 142 mg/dL. Last A1c was 6.3,. He has not yet set up his Dexcom device.  He has a history of gout, for which he takes allopurinol , and reports no flare-ups in the past year. He also mentions a past pituitary tumor, which was surgically removed four years ago, affecting his growth hormones. His endocrinologist manages this  condition.  He takes Lipitor 40 mg at bedtime for cholesterol management and denies experiencing muscle aches or cramps. He has arthritis affecting his entire body, with hip pain if standing too long, but it does not radiate down his legs. He does not take medication for arthritis pain.  No symptoms of sleep apnea, such as snoring, or feeling fatigued in the morning. He maintains an active lifestyle, waking up early and going to the gym regularly. No recent falls, mood disturbances, or respiratory symptoms. Regular bowel movements and denies any urinary symptoms.       Review of Systems:  Review of Systems  Constitutional:  Negative for chills and fever.  HENT:  Negative for congestion and sore throat.   Eyes:  Negative for double vision.  Respiratory:  Negative for cough, sputum production and shortness of breath.   Cardiovascular:  Negative for chest pain, palpitations and leg swelling.  Gastrointestinal:  Negative for abdominal pain, heartburn and nausea.  Genitourinary:  Negative for dysuria, frequency and hematuria.  Musculoskeletal:  Positive for joint pain. Negative for falls and myalgias.  Neurological:  Negative for dizziness, sensory change and focal weakness.   Negative unless indicated in HPI.   Past Medical History:  Diagnosis Date   Asymptomatic varicose veins    Chronic kidney disease    Diaphragmatic hernia without mention of obstruction or gangrene    DM (diabetes mellitus) (HCC)    GERD (gastroesophageal reflux disease)    Gout, unspecified    Hypercalcemia    Hyperlipidemia    Hyperosmolality and/or hypernatremia    Hypertension    Monoclonal paraproteinemia    New daily persistent headache  Osteoarthrosis, unspecified whether generalized or localized, lower leg    Other malaise and fatigue    Primary localized osteoarthrosis, unspecified site    Unspecified disorder of kidney and ureter    Unspecified hereditary and idiopathic peripheral neuropathy     Past Surgical History:  Procedure Laterality Date   BIOPSY  10/19/2018   Procedure: BIOPSY;  Surgeon: Donnald Charleston, MD;  Location: [facility];  Service: Endoscopy;;   CARPAL TUNNEL RELEASE  2007   COLONOSCOPY N/A 06/06/2013   Procedure: COLONOSCOPY;  Surgeon: Charleston LULLA Donnald, MD;  Location: [facility];  Service: Endoscopy;  Laterality: N/A;   COLONOSCOPY WITH PROPOFOL  N/A 10/19/2018   Procedure: COLONOSCOPY WITH PROPOFOL ;  Surgeon: Donnald Charleston, MD;  Location: [facility];  Service: Endoscopy;  Laterality: N/A;   POLYPECTOMY  10/19/2018   Procedure: POLYPECTOMY;  Surgeon: Donnald Charleston, MD;  Location: [facility];  Service: Endoscopy;;   TUMOR REMOVAL  2017   Pituitary adenoma   Social History:   reports that he has never smoked. He has never used smokeless tobacco. He reports that he does not drink alcohol  and does not use drugs.  Family History  Problem Relation Age of Onset   Hypertension Sister    Hypertension Brother    Diabetes Brother    Hypertension Brother    Hypertension Sister    Migraines Neg Hx     Medications: Patient's Medications  New Prescriptions   No medications on file  Previous Medications   ALLOPURINOL  (ZYLOPRIM ) 100 MG TABLET    TAKE 1 TABLET TWICE A DAY   AMLODIPINE  (NORVASC ) 10 MG TABLET    TAKE 1 TABLET DAILY FOR BLOOD PRESSURE   ASPIRIN  EC 81 MG TABLET    Take 1 tablet (81 mg total) by mouth daily.   ATORVASTATIN  (LIPITOR) 40 MG TABLET    TAKE 1 TABLET DAILY   BLOOD GLUCOSE MONITORING SUPPL (ACCU-CHEK AVIVA PLUS) W/DEVICE KIT    1 Device by Does not apply route in the morning and at bedtime.   CHLORTHALIDONE  (HYGROTON ) 25 MG TABLET    TAKE 1 TABLET DAILY FOR BLOOD PRESSURE   CHOLECALCIFEROL (VITAMIN D3) 2000 UNITS TABS    Take 2,000 Units by mouth daily.    CONTINUOUS GLUCOSE RECEIVER (DEXCOM G6 RECEIVER) DEVI    1 Device by Does not apply route daily. As directed E11.22   CONTINUOUS GLUCOSE SENSOR (DEXCOM G6 SENSOR) MISC    1 Device by  Does not apply route daily. As directed change every 10 days E11.22   CONTINUOUS GLUCOSE TRANSMITTER (DEXCOM G6 TRANSMITTER) MISC    1 Device by Does not apply route daily.   GABAPENTIN  (NEURONTIN ) 300 MG CAPSULE    TAKE 1 CAPSULE TWICE A DAY   GLUCOSE BLOOD (FREESTYLE LITE) TEST STRIP    CHECK BLOOD SUGAR THREE TIMES A DAY   INSULIN  LISPRO (HUMALOG  KWIKPEN) 100 UNIT/ML KWIKPEN    INJECT 22 UNITS BEFORE BREAKFAST, 26 UNITS BEFORE LUNCH AND 26 UNITS BEFORE SUPPER PLUS SLIDING SCALE   INSULIN  PEN NEEDLE (SURE COMFORT PEN NEEDLES) 32G X 4 MM MISC    Use 4 times daily before meals DX: E11.65   IRBESARTAN  (AVAPRO ) 300 MG TABLET    TAKE 1 TABLET DAILY   LANCETS MISC. (ACCU-CHEK FASTCLIX LANCET) KIT    1 Device by Does not apply route in the morning and at bedtime.   LANTUS  SOLOSTAR 100 UNIT/ML SOLOSTAR PEN    INJECT 66 UNITS UNDER THE SKIN DAILY   METHYLPREDNISOLONE  (MEDROL   DOSEPAK) 4 MG TBPK TABLET    Take as directed   NEOMYCIN-POLYMYXIN B-DEXAMETHASONE (MAXITROL) 3.5-10000-0.1 SUSP    SMARTSIG:In Eye(s)   OMEGA-3 FATTY ACIDS (FISH OIL) 1200 MG CAPS    Take 1,200 mg by mouth daily.    OZEMPIC , 0.25 OR 0.5 MG/DOSE, 2 MG/3ML SOPN    INJECT 0.25 MG UNDER THE SKIN ONCE A WEEK   PANTOPRAZOLE  (PROTONIX ) 40 MG TABLET    TAKE 1 TABLET DAILY   POTASSIUM CHLORIDE  SA (KLOR-CON  M) 20 MEQ TABLET    TAKE 1 TABLET TWICE A DAY   PREDNISOLONE ACETATE (PRED FORTE) 1 % OPHTHALMIC SUSPENSION    Place 1 drop into the right eye 2 (two) times daily.   TRAMADOL  (ULTRAM ) 50 MG TABLET    1 tablet as needed dx M1A.30X0 Orally Once a day for 90 days  Modified Medications   No medications on file  Discontinued Medications   No medications on file    Physical Exam: There were no vitals filed for this visit. There is no height or weight on file to calculate BMI. BP Readings from Last 3 Encounters:  07/15/23 (!) 156/80  04/29/23 (!) 155/72  01/28/23 (!) 145/61   Wt Readings from Last 3 Encounters:  08/04/23 (!) 405 lb  9.6 oz (184 kg)  07/15/23 (!) 405 lb 9.6 oz (184 kg)  01/07/23 (!) 407 lb 4 oz (184.7 kg)    Physical Exam Constitutional:      Appearance: Normal appearance.  HENT:     Head: Normocephalic and atraumatic.  Cardiovascular:     Rate and Rhythm: Normal rate and regular rhythm.     Pulses: Normal pulses.     Heart sounds: Normal heart sounds.  Pulmonary:     Effort: No respiratory distress.     Breath sounds: No stridor. No wheezing or rales.  Abdominal:     General: Bowel sounds are normal. There is no distension.     Palpations: Abdomen is soft.     Tenderness: There is no abdominal tenderness. There is no guarding.  Neurological:     Mental Status: He is alert. Mental status is at baseline.     Sensory: No sensory deficit.     Motor: No weakness.     Labs reviewed: Basic Metabolic Panel: Recent Labs    12/29/22 0000 07/02/23 0000  NA 136* 139  K 3.3* 3.4*  CL 96* 99  CO2 32* 27*  BUN 20 23*  CREATININE 1.4* 1.7*  CALCIUM  10.4 9.9   Liver Function Tests: Recent Labs    12/29/22 0000 07/02/23 0000  ALBUMIN 3.7 3.8   No results for input(s): LIPASE, AMYLASE in the last 8760 hours. No results for input(s): AMMONIA in the last 8760 hours. CBC: Recent Labs    12/29/22 0000 07/02/23 0000  WBC  --  13.0  NEUTROABS  --  5.40  HGB 11.7* 12.0*  HCT  --  37*  PLT  --  234   Lipid Panel: No results for input(s): CHOL, HDL, LDLCALC, TRIG, CHOLHDL, LDLDIRECT in the last 8760 hours. TSH: No results for input(s): TSH in the last 8760 hours. A1C: Lab Results  Component Value Date   HGBA1C 7.5 (H) 02/13/2021     Assessment/Plan Assessment & Plan Hypertension Elevated blood pressure in clinic (150/90), but patient reports lower readings at home. Currently on Amlodipine  10mg , Chlorthalidone , and Irbesartan . No recent dietary changes. Regular exercise with swimming three times a week. -Continue current antihypertensive medications. Repeat bp  slightly improved He did not take his morning meds    Diabetes Mellitus Managed by endocrinologist. On Lantus  (66 units at night) and Lispro (22 units before breakfast and lunch, 26 units before dinner). Blood glucose checked 3-4 times a day, with readings around 140s. Last A1c was 6.2-6.3. -Continue current insulin  regimen. -Encourage consistent use of Dexcom for blood glucose monitoring.  Hyperlipidemia On Lipitor 40mg  at bedtime. -Continue Lipitor 40mg  at bedtime. -Check lipid panel today.  Arthritis Reports pain in hip area with prolonged standing. No current pain management regimen. -Consider use of lidocaine patch, heating pad, and arthritis strength Tylenol  (up to 1000mg  every 8 hours, max 3000mg /day).  Gout On Allopurinol , no recent flare-ups. -Continue Allopurinol .  Pituitary Tumor Surgically removed four years ago, managed by endocrinologist   -Continue follow-up with endocrinologist. Need records from endo  CKD stage 3 Avoid nephrotoxic meds Increase oral hydration   General Health Maintenance -Continue regular eye and foot exams. -Consider shingles vaccine if not received recently. -Return in 4 months for follow-up and monitoring of hypertension and kidney function. No follow-ups on file.:   40 minTotal time spent for obtaining history,  performing a medically appropriate examination and evaluation, reviewing the tests,ordering  tests,  documenting clinical information in the electronic or other health record, care coordination (not separately reported)

## 2023-11-18 LAB — BASIC METABOLIC PANEL WITH GFR
BUN/Creatinine Ratio: 12 (calc) (ref 6–22)
BUN: 18 mg/dL (ref 7–25)
CO2: 29 mmol/L (ref 20–32)
Calcium: 9.8 mg/dL (ref 8.6–10.3)
Chloride: 96 mmol/L — ABNORMAL LOW (ref 98–110)
Creat: 1.47 mg/dL — ABNORMAL HIGH (ref 0.70–1.28)
Glucose, Bld: 126 mg/dL — ABNORMAL HIGH (ref 65–99)
Potassium: 3.2 mmol/L — ABNORMAL LOW (ref 3.5–5.3)
Sodium: 138 mmol/L (ref 135–146)
eGFR: 49 mL/min/{1.73_m2} — ABNORMAL LOW (ref >=60)

## 2023-11-18 LAB — LIPID PANEL
Cholesterol: 170 mg/dL (ref <200)
HDL: 54 mg/dL (ref >=40)
LDL Cholesterol (Calc): 97 mg/dL
Non-HDL Cholesterol (Calc): 116 mg/dL (ref <130)
Total CHOL/HDL Ratio: 3.1 (calc) (ref <5.0)
Triglycerides: 95 mg/dL (ref <150)

## 2023-11-18 LAB — HEMOGLOBIN A1C
Hgb A1c MFr Bld: 7.2 %{Hb} — ABNORMAL HIGH (ref <5.7)
Mean Plasma Glucose: 160 mg/dL
eAG (mmol/L): 8.9 mmol/L

## 2023-11-27 ENCOUNTER — Ambulatory Visit: Payer: Medicare Other

## 2023-11-27 DIAGNOSIS — E1142 Type 2 diabetes mellitus with diabetic polyneuropathy: Secondary | ICD-10-CM

## 2023-11-27 DIAGNOSIS — M2141 Flat foot [pes planus] (acquired), right foot: Secondary | ICD-10-CM

## 2023-11-27 NOTE — Progress Notes (Signed)
Patient presents to the office today for diabetic shoe and insole measuring.  Patient was measured with brannock device to determine size and width for 1 pair of extra depth shoes and DM inserts  Documentation of medical necessity will be sent to patient's treating diabetic doctor to verify and sign.   Patient's diabetic provider: Adrian Prince MD   Shoes and insoles will be ordered at that time and patient will be notified for an appointment for fitting when they arrive.   Shoe size (per patient): 12.5XX Brannock measurement: 12XX Shoe choice:   672 and Shoe size ordered: 13XXW Ppw and ABN signed

## 2023-12-22 DIAGNOSIS — E785 Hyperlipidemia, unspecified: Secondary | ICD-10-CM | POA: Diagnosis not present

## 2023-12-22 DIAGNOSIS — Z794 Long term (current) use of insulin: Secondary | ICD-10-CM | POA: Diagnosis not present

## 2023-12-22 DIAGNOSIS — E1129 Type 2 diabetes mellitus with other diabetic kidney complication: Secondary | ICD-10-CM | POA: Diagnosis not present

## 2023-12-22 DIAGNOSIS — N2581 Secondary hyperparathyroidism of renal origin: Secondary | ICD-10-CM | POA: Diagnosis not present

## 2023-12-22 DIAGNOSIS — E221 Hyperprolactinemia: Secondary | ICD-10-CM | POA: Diagnosis not present

## 2023-12-22 DIAGNOSIS — E291 Testicular hypofunction: Secondary | ICD-10-CM | POA: Diagnosis not present

## 2023-12-22 DIAGNOSIS — I10 Essential (primary) hypertension: Secondary | ICD-10-CM | POA: Diagnosis not present

## 2023-12-22 DIAGNOSIS — D352 Benign neoplasm of pituitary gland: Secondary | ICD-10-CM | POA: Diagnosis not present

## 2023-12-22 DIAGNOSIS — N1831 Chronic kidney disease, stage 3a: Secondary | ICD-10-CM | POA: Diagnosis not present

## 2023-12-22 DIAGNOSIS — D126 Benign neoplasm of colon, unspecified: Secondary | ICD-10-CM | POA: Diagnosis not present

## 2023-12-22 DIAGNOSIS — M1A30X0 Chronic gout due to renal impairment, unspecified site, without tophus (tophi): Secondary | ICD-10-CM | POA: Diagnosis not present

## 2023-12-28 DIAGNOSIS — I129 Hypertensive chronic kidney disease with stage 1 through stage 4 chronic kidney disease, or unspecified chronic kidney disease: Secondary | ICD-10-CM | POA: Diagnosis not present

## 2023-12-28 DIAGNOSIS — E119 Type 2 diabetes mellitus without complications: Secondary | ICD-10-CM | POA: Diagnosis not present

## 2023-12-28 DIAGNOSIS — N1831 Chronic kidney disease, stage 3a: Secondary | ICD-10-CM | POA: Diagnosis not present

## 2023-12-28 DIAGNOSIS — N189 Chronic kidney disease, unspecified: Secondary | ICD-10-CM | POA: Diagnosis not present

## 2023-12-28 DIAGNOSIS — E893 Postprocedural hypopituitarism: Secondary | ICD-10-CM | POA: Diagnosis not present

## 2023-12-28 DIAGNOSIS — Z794 Long term (current) use of insulin: Secondary | ICD-10-CM | POA: Diagnosis not present

## 2023-12-28 DIAGNOSIS — E1122 Type 2 diabetes mellitus with diabetic chronic kidney disease: Secondary | ICD-10-CM | POA: Diagnosis not present

## 2023-12-28 DIAGNOSIS — N2581 Secondary hyperparathyroidism of renal origin: Secondary | ICD-10-CM | POA: Diagnosis not present

## 2023-12-28 DIAGNOSIS — D631 Anemia in chronic kidney disease: Secondary | ICD-10-CM | POA: Diagnosis not present

## 2024-02-03 ENCOUNTER — Ambulatory Visit (INDEPENDENT_AMBULATORY_CARE_PROVIDER_SITE_OTHER): Payer: Medicare Other | Admitting: Podiatry

## 2024-02-03 ENCOUNTER — Encounter: Payer: Self-pay | Admitting: Podiatry

## 2024-02-03 DIAGNOSIS — M79675 Pain in left toe(s): Secondary | ICD-10-CM

## 2024-02-03 DIAGNOSIS — E119 Type 2 diabetes mellitus without complications: Secondary | ICD-10-CM

## 2024-02-03 DIAGNOSIS — B351 Tinea unguium: Secondary | ICD-10-CM

## 2024-02-03 DIAGNOSIS — M2141 Flat foot [pes planus] (acquired), right foot: Secondary | ICD-10-CM

## 2024-02-03 DIAGNOSIS — M79674 Pain in right toe(s): Secondary | ICD-10-CM | POA: Diagnosis not present

## 2024-02-03 DIAGNOSIS — E1142 Type 2 diabetes mellitus with diabetic polyneuropathy: Secondary | ICD-10-CM

## 2024-02-03 NOTE — Progress Notes (Signed)
 ANNUAL DIABETIC FOOT EXAM  Subjective: Adrian Matthews presents today for annual diabetic foot exam.  Chief Complaint  Patient presents with   Diabetes    Patient states his A1c is 6.0 and his PCP is Veludandi, Prashanthi and he last saw her last month    Patient confirms h/o diabetes.  Patient denies any h/o foot wounds.  Patient has been diagnosed with neuropathy.  Adrian Gall, MD is patient's PCP.  Past Medical History:  Diagnosis Date   Asymptomatic varicose veins    Chronic kidney disease    Diaphragmatic hernia without mention of obstruction or gangrene    DM (diabetes mellitus) (HCC)    GERD (gastroesophageal reflux disease)    Gout, unspecified    Hypercalcemia    Hyperlipidemia    Hyperosmolality and/or hypernatremia    Hypertension    Monoclonal paraproteinemia    New daily persistent headache    Osteoarthrosis, unspecified whether generalized or localized, lower leg    Other malaise and fatigue    Primary localized osteoarthrosis, unspecified site    Unspecified disorder of kidney and ureter    Unspecified hereditary and idiopathic peripheral neuropathy    Patient Active Problem List   Diagnosis Date Noted   Flat foot 11/18/2022   Pain due to onychomycosis of toenails of both feet 11/18/2022   Diabetic peripheral neuropathy associated with type 2 diabetes mellitus (HCC) 11/18/2022   Type 2 diabetes mellitus with diabetic chronic kidney disease (HCC) 08/18/2022   Chronic gouty arthritis 01/07/2022   Gastroesophageal reflux disease 06/26/2021   General medical examination for administrative purposes 06/26/2021   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   Pain in right knee 06/26/2021   Claudication of both lower extremities (HCC) 06/07/2020   Type 2 diabetes mellitus with diabetic neuropathy, with long-term current use of insulin  (HCC) 01/06/2019   Benign neoplasm of pituitary gland (HCC) 11/24/2018   Hyperlipidemia 11/24/2018    Iron deficiency anemia 08/30/2018   Generalized osteoarthritis of multiple sites 08/30/2018   Secondary hyperparathyroidism of renal origin (HCC) 08/03/2018   Chronic kidney disease, stage 3 (HCC) 12/17/2017   Long term (current) use of insulin  (HCC) 11/13/2017   Testicular hypofunction 11/13/2017   Testosterone  deficiency in male 08/10/2017   Pituitary microadenoma with hyperprolactinemia (HCC) 01/02/2017   Hormone deficiency 01/02/2017   At risk for obstructive sleep apnea 09/25/2016   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   Benign neoplasm of colon 04/24/2016   Diabetic renal disease (HCC) 04/24/2016   Morning headache 02/11/2016   Arthritis 10/27/2014   Class 3 severe obesity with serious comorbidity and body mass index (BMI) of 50.0 to 59.9 in adult [facility]) 06/04/2013   Rectal bleeding 06/04/2013   Osteoarthrosis, unspecified whether generalized or localized, lower leg    Monoclonal paraproteinemia    Gout, unspecified    Type 2 diabetes mellitus with stage 3 chronic kidney disease, with long-term current use of insulin  (HCC) 02/28/2013   Hyperlipidemia associated with type 2 diabetes mellitus (HCC) 02/28/2013   Essential hypertension 02/28/2013   Disorder of kidney and ureter 02/28/2013   Past Surgical History:  Procedure Laterality Date   BIOPSY  10/19/2018   Procedure: BIOPSY;  Surgeon: Lanita Pitman, MD;  Location: [facility] ENDOSCOPY;  Service: Endoscopy;;   CARPAL TUNNEL RELEASE  2007   COLONOSCOPY N/A 06/06/2013   Procedure: COLONOSCOPY;  Surgeon: Brice Campi, MD;  [facility] ENDOSCOPY;  Service: Endoscopy;  Laterality: N/A;   COLONOSCOPY WITH PROPOFOL  N/A 10/19/2018   Procedure:  COLONOSCOPY WITH PROPOFOL ;  Surgeon: Lanita Pitman, MD;  Location: [facility] ENDOSCOPY;  Service: Endoscopy;  Laterality: N/A;   POLYPECTOMY  10/19/2018   Procedure: POLYPECTOMY;  Surgeon: Lanita Pitman, MD;  Location: [facility] ENDOSCOPY;  Service: Endoscopy;;   TUMOR REMOVAL  2017   Pituitary adenoma    Current Outpatient Medications on File Prior to Visit  Medication Sig Dispense Refill   allopurinol  (ZYLOPRIM ) 100 MG tablet TAKE 1 TABLET TWICE A DAY 180 tablet 3   amLODipine  (NORVASC ) 10 MG tablet TAKE 1 TABLET DAILY FOR BLOOD PRESSURE 90 tablet 3   aspirin  EC 81 MG tablet Take 1 tablet (81 mg total) by mouth daily. 30 tablet 11   atorvastatin  (LIPITOR) 40 MG tablet TAKE 1 TABLET DAILY 90 tablet 3   Blood Glucose Monitoring Suppl (ACCU-CHEK AVIVA PLUS) w/Device KIT 1 Device by Does not apply route in the morning and at bedtime. 1 kit 0   chlorthalidone  (HYGROTON ) 25 MG tablet TAKE 1 TABLET DAILY FOR BLOOD PRESSURE 90 tablet 3   Cholecalciferol (VITAMIN D3) 2000 units TABS Take 2,000 Units by mouth daily.      Continuous Glucose Receiver (DEXCOM G6 RECEIVER) DEVI 1 Device by Does not apply route daily. As directed E11.22 1 each 11   Continuous Glucose Sensor (DEXCOM G6 SENSOR) MISC 1 Device by Does not apply route daily. As directed change every 10 days E11.22 3 each 11   Continuous Glucose Transmitter (DEXCOM G6 TRANSMITTER) MISC 1 Device by Does not apply route daily. 1 each 4   gabapentin  (NEURONTIN ) 300 MG capsule TAKE 1 CAPSULE TWICE A DAY 180 capsule 3   glucose blood (FREESTYLE LITE) test strip CHECK BLOOD SUGAR THREE TIMES A DAY 300 strip 3   insulin  lispro (HUMALOG  KWIKPEN) 100 UNIT/ML KwikPen INJECT 22 UNITS BEFORE BREAKFAST, 26 UNITS BEFORE LUNCH AND 26 UNITS BEFORE SUPPER PLUS SLIDING SCALE 75 mL 3   Insulin  Pen Needle (SURE COMFORT PEN NEEDLES) 32G X 4 MM MISC Use 4 times daily before meals DX: E11.65 500 each 1   irbesartan  (AVAPRO ) 300 MG tablet TAKE 1 TABLET DAILY 90 tablet 1   Lancets Misc. (ACCU-CHEK FASTCLIX LANCET) KIT 1 Device by Does not apply route in the morning and at bedtime. 1 kit 0   LANTUS  SOLOSTAR 100 UNIT/ML Solostar Pen INJECT 66 UNITS UNDER THE SKIN DAILY 60 mL 3   Omega-3 Fatty Acids (FISH OIL) 1200 MG CAPS Take 1,200 mg by mouth daily.      OZEMPIC , 0.25  OR 0.5 MG/DOSE, 2 MG/3ML SOPN INJECT 0.25 MG UNDER THE SKIN ONCE A WEEK 3 mL 5   pantoprazole  (PROTONIX ) 40 MG tablet TAKE 1 TABLET DAILY 90 tablet 3   potassium chloride  SA (KLOR-CON  M) 20 MEQ tablet TAKE 1 TABLET TWICE A DAY 180 tablet 3   No current facility-administered medications on file prior to visit.    No Known Allergies Social History   Occupational History   Occupation: Retired   Tobacco Use   Smoking status: Never   Smokeless tobacco: Never  Vaping Use   Vaping status: Never Used  Substance and Sexual Activity   Alcohol  use: No    Alcohol /week: 0.0 standard drinks of alcohol    Drug use: No   Sexual activity: Not Currently   Family History  Problem Relation Age of Onset   Hypertension Sister    Hypertension Brother    Diabetes Brother    Hypertension Brother    Hypertension Sister    Migraines Neg  Hx    Immunization History  Administered Date(s) Administered   Fluad Quad(high Dose 65+) 07/20/2019, 07/11/2020, 07/11/2022   Fluad Trivalent(High Dose 65+) 07/15/2023   Influenza, High Dose Seasonal PF 07/25/2016, 07/09/2017, 08/26/2018   Influenza, Quadrivalent, Recombinant, Inj, Pf 06/24/2021   Influenza,inj,Quad PF,6+ Mos 07/04/2013   Influenza-Unspecified 07/13/2014, 07/28/2015   PFIZER(Purple Top)SARS-COV-2 Vaccination 12/11/2019, 01/10/2020, 07/28/2020   Pneumococcal Conjugate-13 08/17/2014   Pneumococcal Polysaccharide-23 08/08/2013   Tdap 04/12/2013   Unspecified SARS-COV-2 Vaccination 07/11/2022   Zoster, Live 08/08/2013   Zoster, Unspecified 08/08/2013     Review of Systems: Negative except as noted in the HPI.   Objective: There were no vitals filed for this visit.  Riddick Nuon is a pleasant 78 y.o. male in NAD. AAO X 3.  Diabetic foot exam was performed with the following findings:   Vascular Examination: Capillary refill time immediate b/l. Vascular status intact b/l with palpable pedal pulses. Pedal hair present b/l. No pain with calf  compression b/l. Skin temperature gradient WNL b/l. No cyanosis or clubbing b/l. No ischemia or gangrene noted b/l. +1 pitting edema noted BLE.  Neurological Examination: Pt has subjective symptoms of neuropathy. Sensation grossly intact b/l with 10 gram monofilament. Vibratory sensation intact b/l.   Dermatological Examination: Pedal skin with normal turgor, texture and tone b/l.  No open wounds. No interdigital macerations.   Toenails 1-5 b/l thick, discolored, elongated with subungual debris and pain on dorsal palpation.   No corns, calluses nor porokeratotic lesions noted.  Musculoskeletal Examination: Muscle strength 5/5 to all lower extremity muscle groups bilaterally. Pes planus deformity noted bilateral LE. Patient ambulates independent of any assistive aids.  Radiographs: None     Lab Results  Component Value Date   HGBA1C 7.2 (H) 11/17/2023   ADA Risk Categorization: Low Risk :  Patient has all of the following: Intact protective sensation No prior foot ulcer  No severe deformity Pedal pulses present  Assessment: 1. Pain due to onychomycosis of toenails of both feet   2. Pes planus of both feet   3. Diabetic peripheral neuropathy associated with type 2 diabetes mellitus (HCC)   4. Encounter for diabetic foot exam (HCC)     Plan: Diabetic foot examination performed today.  All patient's and/or POA's questions/concerns addressed on today's visit. Mycotic toenails 1-5 debrided in length and girth without incident. Continue daily foot inspections and monitor blood glucose per PCP/Endocrinologist's recommendations. Continue soft, supportive shoe gear daily. Report any pedal injuries to medical professional. Call office if there are any questions/concerns. -Patient to contact Pedorthist for update on diabetic shoes. -Patient/POA to call should there be question/concern in the interim. Return in about 3 months (around 05/04/2024).  Luella Sager, DPM        LOCATION: [address] [place] [address]                   Office [phone]   [facility] LOCATION: [address] [place], [place] [address] Office [phone]

## 2024-02-15 ENCOUNTER — Other Ambulatory Visit: Payer: Self-pay | Admitting: Family Medicine

## 2024-02-19 ENCOUNTER — Other Ambulatory Visit: Payer: Self-pay | Admitting: Family Medicine

## 2024-02-19 ENCOUNTER — Telehealth: Payer: Self-pay | Admitting: Podiatry

## 2024-02-19 NOTE — Telephone Encounter (Signed)
 Odilia Bennett called in needing paperwork for pt.Please fax to [phone]

## 2024-02-22 ENCOUNTER — Other Ambulatory Visit: Payer: Self-pay | Admitting: Sports Medicine

## 2024-02-22 DIAGNOSIS — I10 Essential (primary) hypertension: Secondary | ICD-10-CM

## 2024-03-04 ENCOUNTER — Telehealth: Payer: Self-pay

## 2024-03-04 NOTE — Telephone Encounter (Signed)
 New ppw recvd for shoes Dr Daria Eddy correct fax number if we can update is [phone] sending email to [facility] / new order sent to [employer] as well  [phone]

## 2024-03-15 ENCOUNTER — Other Ambulatory Visit: Payer: Self-pay | Admitting: Family Medicine

## 2024-03-15 DIAGNOSIS — E1122 Type 2 diabetes mellitus with diabetic chronic kidney disease: Secondary | ICD-10-CM

## 2024-03-23 ENCOUNTER — Telehealth: Payer: Self-pay | Admitting: Sports Medicine

## 2024-03-23 DIAGNOSIS — N182 Chronic kidney disease, stage 2 (mild): Secondary | ICD-10-CM

## 2024-03-23 MED ORDER — LANTUS SOLOSTAR 100 UNIT/ML ~~LOC~~ SOPN: PEN_INJECTOR | SUBCUTANEOUS | 3 refills | Status: AC

## 2024-03-23 NOTE — Telephone Encounter (Signed)
 Copied from CRM [id number]. Topic: Clinical - Medication Refill >> Mar 23, 2024  3:03 PM Latavia C wrote: Medication: LANTUS  SOLOSTAR 100 UNIT/ML Solostar Pen  Has the patient contacted their pharmacy? Yes (Agent: If no, request that the patient contact the pharmacy for the refill. If patient does not wish to contact the pharmacy document the reason why and proceed with request.) (Agent: If yes, when and what did the pharmacy advise?)  This is the patient's preferred pharmacy:  [facility] - [place], [place] - [address] [place] [address] Phone: [phone] Fax: [phone]  Is this the correct pharmacy for this prescription? Yes If no, delete pharmacy and type the correct one.   Has the prescription been filled recently? No  Is the patient out of the medication? No  Has the patient been seen for an appointment in the last year OR does the patient have an upcoming appointment? Yes  Can we respond through MyChart? Yes  Agent: Please be advised that Rx refills may take up to 3 business days. We ask that you follow-up with your pharmacy.

## 2024-03-29 ENCOUNTER — Encounter: Payer: Self-pay | Admitting: Sports Medicine

## 2024-03-29 ENCOUNTER — Ambulatory Visit (INDEPENDENT_AMBULATORY_CARE_PROVIDER_SITE_OTHER): Payer: Medicare Other | Admitting: Sports Medicine

## 2024-03-29 VITALS — BP 124/78 | HR 83 | Temp 97.0°F | Resp 13 | Ht 71.0 in | Wt >= 6400 oz

## 2024-03-29 DIAGNOSIS — E1122 Type 2 diabetes mellitus with diabetic chronic kidney disease: Secondary | ICD-10-CM

## 2024-03-29 DIAGNOSIS — N1832 Chronic kidney disease, stage 3b: Secondary | ICD-10-CM

## 2024-03-29 DIAGNOSIS — E1169 Type 2 diabetes mellitus with other specified complication: Secondary | ICD-10-CM | POA: Diagnosis not present

## 2024-03-29 DIAGNOSIS — N182 Chronic kidney disease, stage 2 (mild): Secondary | ICD-10-CM | POA: Diagnosis not present

## 2024-03-29 DIAGNOSIS — E785 Hyperlipidemia, unspecified: Secondary | ICD-10-CM

## 2024-03-29 DIAGNOSIS — Z794 Long term (current) use of insulin: Secondary | ICD-10-CM

## 2024-03-29 DIAGNOSIS — Z6841 Body Mass Index (BMI) 40.0 and over, adult: Secondary | ICD-10-CM | POA: Diagnosis not present

## 2024-03-29 DIAGNOSIS — E66813 Obesity, class 3: Secondary | ICD-10-CM

## 2024-03-29 DIAGNOSIS — I10 Essential (primary) hypertension: Secondary | ICD-10-CM | POA: Diagnosis not present

## 2024-03-29 LAB — BASIC METABOLIC PANEL WITHOUT GFR
BUN/Creatinine Ratio: 14 (calc) (ref 6–22)
BUN: 22 mg/dL (ref 7–25)
CO2: 31 mmol/L (ref 20–32)
Calcium: 9.9 mg/dL (ref 8.6–10.3)
Chloride: 99 mmol/L (ref 98–110)
Creat: 1.55 mg/dL — ABNORMAL HIGH (ref 0.70–1.28)
Glucose, Bld: 71 mg/dL (ref 65–99)
Potassium: 4.1 mmol/L (ref 3.5–5.3)
Sodium: 139 mmol/L (ref 135–146)

## 2024-03-29 MED ORDER — GABAPENTIN 100 MG PO CAPS: 200.0000 mg | ORAL_CAPSULE | Freq: Two times a day (BID) | ORAL | Status: DC

## 2024-03-29 MED ORDER — INSULIN LISPRO (1 UNIT DIAL) 100 UNIT/ML (KWIKPEN)
PEN_INJECTOR | SUBCUTANEOUS | Status: AC
Start: 2024-03-29 — End: ?

## 2024-03-29 NOTE — Progress Notes (Signed)
 Careteam: Patient Care Team: Tye Gall, MD as PCP - General (Internal Medicine) Melodie Spry, MD as Consulting Physician (Nephrology) Zula Hitch, MD as Consulting Physician (Ophthalmology) Lanita Pitman, MD as Consulting Physician (Gastroenterology)  PLACE OF SERVICE:  [facility]  Advanced Directive information    No Known Allergies  Chief Complaint  Patient presents with   Medical Management of Chronic Issues    4 month follow up. Discuss the need for Shingrix vaccine, DTAP vaccine, Covid Booster, and Urine Microalbumin.     Discussed the use of AI scribe software for clinical note transcription with the patient, who gave verbal consent to proceed.  History of Present Illness Adrian Matthews is a 78 year old male who presents for routine follow-up.  His blood pressure is stable, managed with amlodipine  10 mg, chlorthalidone , and irbesartan .  Diabetes management includes Lantus  66 units at night and Lispro 22 units with breakfast and lunch, and 26 units with dinner. He checks his blood sugar about four times a day, with occasional readings up to 202 mg/dL, particularly when insulin  is delayed after meals. He experiences hypoglycemic episodes, with levels dropping to 60 mg/dL about four times this month, often related to inadequate food intake. He is taking Ozempic , which affects his appetite, and manages his diet with tuna or chicken salad and grapes.  He experiences numbness in his legs and feet, particularly after prolonged activity such as grocery shopping. He takes gabapentin  200 mg twice daily, which was adjusted from a higher dose due to lethargy.  He has a history of gout, managed with allopurinol  twice daily, and has not had any recent flare-ups.  He swims for exercise three times a week for 45 minutes and reports no chest pain, shortness of breath, or dizziness during physical activity. No heartburn, acid reflux, or sleep apnea, and he reports sleeping  well without fatigue in the morning.  He lives alone but receives meal assistance from his sister.  He has a history of a pituitary tumor removed seven years ago, managed by his endocrinologist. No recent headaches, nausea, or visual disturbances. He takes Lipitor 40 mg at night for cholesterol and Protonix  for acid reflux. He also takes potassium supplements daily    He experiences occasional leg swelling, particularly after prolonged standing or walking, and uses compression socks at home.     Review of Systems:  Review of Systems  Constitutional:  Negative for chills and fever.  HENT:  Negative for congestion and sore throat.   Eyes:  Negative for double vision.  Respiratory:  Negative for cough, sputum production and shortness of breath.   Cardiovascular:  Negative for chest pain, palpitations and leg swelling.  Gastrointestinal:  Negative for abdominal pain, heartburn and nausea.  Genitourinary:  Negative for dysuria, frequency and hematuria.  Musculoskeletal:  Negative for falls and myalgias.  Neurological:  Positive for sensory change. Negative for dizziness.   Negative unless indicated in HPI.   Past Medical History:  Diagnosis Date   Asymptomatic varicose veins    Chronic kidney disease    Diaphragmatic hernia without mention of obstruction or gangrene    DM (diabetes mellitus) (HCC)    GERD (gastroesophageal reflux disease)    Gout, unspecified    Hypercalcemia    Hyperlipidemia    Hyperosmolality and/or hypernatremia    Hypertension    Monoclonal paraproteinemia    New daily persistent headache    Osteoarthrosis, unspecified whether generalized or localized, lower leg    Other malaise and  fatigue    Primary localized osteoarthrosis, unspecified site    Unspecified disorder of kidney and ureter    Unspecified hereditary and idiopathic peripheral neuropathy    Past Surgical History:  Procedure Laterality Date   BIOPSY  10/19/2018   Procedure: BIOPSY;  Surgeon:  Lanita Pitman, MD;  Location: [facility];  Service: Endoscopy;;   CARPAL TUNNEL RELEASE  2007   COLONOSCOPY N/A 06/06/2013   Procedure: COLONOSCOPY;  Surgeon: Brice Campi, MD;  Location: [facility] END[facility]COPY;  Service: Endoscopy;  Laterality: N/A;   COLONOSCOPY WITH PROPOFOL  N/A 10/19/2018   Procedure: COLONOSCOPY WITH PROPOFOL ;  Surgeon: Lanita Pitman, MD;  Location: [facility];  Service: Endoscopy;  Laterality: N/A;   POLYPECTOMY  10/19/2018   Procedure: POLYPECTOMY;  Surgeon: Lanita Pitman, MD;  Location: [facility];  Service: Endoscopy;;   TUMOR REMOVAL  2017   Pituitary adenoma   Social History:   reports that he has never smoked. He has never used smokeless tobacco. He reports that he does not drink alcohol  and does not use drugs.  Family History  Problem Relation Age of Onset   Hypertension Sister    Hypertension Brother    Diabetes Brother    Hypertension Brother    Hypertension Sister    Migraines Neg Hx     Medications: Patient's Medications  New Prescriptions   No medications on file  Previous Medications   ALLOPURINOL  (ZYLOPRIM ) 100 MG TABLET    TAKE 1 TABLET TWICE A DAY   AMLODIPINE  (NORVASC ) 10 MG TABLET    TAKE 1 TABLET DAILY FOR BLOOD PRESSURE   ASPIRIN  EC 81 MG TABLET    Take 1 tablet (81 mg total) by mouth daily.   ATORVASTATIN  (LIPITOR) 40 MG TABLET    TAKE 1 TABLET DAILY   BLOOD GLUCOSE MONITORING SUPPL (ACCU-CHEK AVIVA PLUS) W/DEVICE KIT    1 Device by Does not apply route in the morning and at bedtime.   CHLORTHALIDONE  (HYGROTON ) 25 MG TABLET    TAKE 1 TABLET DAILY FOR BLOOD PRESSURE   CHOLECALCIFEROL (VITAMIN D3) 2000 UNITS TABS    Take 2,000 Units by mouth daily.    CONTINUOUS GLUCOSE RECEIVER (DEXCOM G6 RECEIVER) DEVI    1 Device by Does not apply route daily. As directed E11.22   CONTINUOUS GLUCOSE SENSOR (DEXCOM G6 SENSOR) MISC    1 Device by Does not apply route daily. As directed change every 10 days E11.22   CONTINUOUS GLUCOSE TRANSMITTER  (DEXCOM G6 TRANSMITTER) MISC    1 Device by Does not apply route daily.   GABAPENTIN  (NEURONTIN ) 300 MG CAPSULE    TAKE 1 CAPSULE TWICE A DAY   GLUCOSE BLOOD (FREESTYLE LITE) TEST STRIP    CHECK BLOOD SUGAR THREE TIMES A DAY   INSULIN  GLARGINE (LANTUS  SOLOSTAR) 100 UNIT/ML SOLOSTAR PEN    Inject 66 units under the skin daily.   INSULIN  LISPRO (HUMALOG ) 100 UNIT/ML KWIKPEN    INJECT 22 UNITS BEFORE BREAKFAST, 26 UNITS BEFORE LUNCH AND 26 UNITS BEFORE SUPPER PLUS SLIDING SCALE   INSULIN  PEN NEEDLE (SURE COMFORT PEN NEEDLES) 32G X 4 MM MISC    Use 4 times daily before meals DX: E11.65   IRBESARTAN  (AVAPRO ) 300 MG TABLET    TAKE 1 TABLET DAILY   LANCETS MISC. (ACCU-CHEK FASTCLIX LANCET) KIT    1 Device by Does not apply route in the morning and at bedtime.   OMEGA-3 FATTY ACIDS (FISH OIL) 1200 MG CAPS    Take 1,200 mg by  mouth daily.    OZEMPIC , 0.25 OR 0.5 MG/DOSE, 2 MG/3ML SOPN    INJECT 0.25 MG UNDER THE SKIN ONCE A WEEK   PANTOPRAZOLE  (PROTONIX ) 40 MG TABLET    TAKE 1 TABLET DAILY   POTASSIUM CHLORIDE  SA (KLOR-CON  M) 20 MEQ TABLET    TAKE 1 TABLET TWICE A DAY  Modified Medications   No medications on file  Discontinued Medications   No medications on file    Physical Exam: There were no vitals filed for this visit. There is no height or weight on file to calculate BMI. BP Readings from Last 3 Encounters:  11/17/23 (!) 142/68  07/15/23 (!) 156/80  04/29/23 (!) 155/72   Wt Readings from Last 3 Encounters:  11/17/23 (!) 415 lb (188.2 kg)  08/04/23 (!) 405 lb 9.6 oz (184 kg)  07/15/23 (!) 405 lb 9.6 oz (184 kg)    Physical Exam Constitutional:      Appearance: Normal appearance.  HENT:     Head: Normocephalic and atraumatic.   Cardiovascular:     Rate and Rhythm: Normal rate and regular rhythm.     Pulses: Normal pulses.     Heart sounds: Normal heart sounds.  Pulmonary:     Effort: No respiratory distress.     Breath sounds: No stridor. No wheezing or rales.  Abdominal:      General: Bowel sounds are normal. There is no distension.     Palpations: Abdomen is soft.     Tenderness: There is no abdominal tenderness. There is no right CVA tenderness or guarding.   Musculoskeletal:        General: Swelling present.     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 Mental status is at baseline.     Motor: No weakness.     Labs reviewed: Basic Metabolic Panel: Recent Labs    07/02/23 0000 11/17/23 0905  NA 139 138  K 3.4* 3.2*  CL 99 96*  CO2 27* 29  GLUCOSE  --  126*  BUN 23* 18  CREATININE 1.7* 1.47*  CALCIUM  9.9 9.8   Liver Function Tests: Recent Labs    07/02/23 0000  ALBUMIN 3.8   No results for input(s): LIPASE, AMYLASE in the last 8760 hours. No results for input(s): AMMONIA in the last 8760 hours. CBC: Recent Labs    07/02/23 0000  WBC 13.0  NEUTROABS 5.40  HGB 12.0*  HCT 37*  PLT 234   Lipid Panel: Recent Labs    11/17/23 0905  CHOL 170  HDL 54  LDLCALC 97  TRIG 95  CHOLHDL 3.1   TSH: No results for input(s): TSH in the last 8760 hours. A1C: Lab Results  Component Value Date   HGBA1C 7.2 (H) 11/17/2023    Assessment and Plan Assessment & Plan   1. Type 2 diabetes mellitus with stage 2 chronic kidney disease, with long-term current use of insulin  (HCC) Lab Results  Component Value Date   HGBA1C 7.2 (H) 11/17/2023   HGBA1C 7.5 (H) 02/13/2021   HGBA1C 7.7 (H) 10/16/2020   Instructed patient to cut down lispro by 3-4 units due to hypoglycemia Avoid skipping meals - insulin  lispro (HUMALOG ) 100 UNIT/ML KwikPen; INJECT 22 UNITS BEFORE BREAKFAST, 22UNITS BEFORE LUNCH AND 26 UNITS BEFORE SUPPER PLUS SLIDING SCALE - Microalbumin/Creatinine Ratio, Urine - gabapentin  (NEURONTIN ) 100 MG capsule; Take 2 capsules (200 mg total) by mouth 2 (two) times daily. - Basic  Metabolic Panel with eGFR  2. Essential hypertension (Primary) At goal Cont with the same  3.  Hyperlipidemia associated with type 2 diabetes mellitus (HCC) Lipid Panel   Cont with lipitor  4. Stage 3b chronic kidney disease (HCC) Avoid nephrotoxic meds Lab Results  Component Value Date   CREATININE 1.47 (H) 11/17/2023   CREATININE 1.7 (A) 07/02/2023   CREATININE 1.4 (A) 12/29/2022     5. Class 3 severe obesity with serious comorbidity and body mass index (BMI) of 50.0 to 59.9 in adult, unspecified obesity type Instructed patient to cont with regular exercises

## 2024-03-30 ENCOUNTER — Ambulatory Visit: Payer: Self-pay | Admitting: Sports Medicine

## 2024-03-30 LAB — MICROALBUMIN / CREATININE URINE RATIO
Creatinine, Urine: 79 mg/dL (ref 20–320)
Microalb Creat Ratio: 6 mg/g{creat} (ref <30)
Microalb, Ur: 0.5 mg/dL

## 2024-04-01 ENCOUNTER — Telehealth: Payer: Self-pay

## 2024-04-01 NOTE — Telephone Encounter (Signed)
 Copied from CRM [id number]. Topic: Clinical - Lab/Test Results >> Mar 30, 2024 10:15 AM Tisa Forester wrote: Reason for CRM: patient returning [place] call regarding his lab results Patient access specialist related message verbatim from Dr. Tye Gall Patient has no question regarding lab result Patient call back number : >> Apr 01, 2024  8:30 AM Retta Caster wrote: Patient returning office call from [facility]. Called and transferred   Message sent to Dillard Frame, CMA and Geni Keto, CMA

## 2024-04-04 ENCOUNTER — Other Ambulatory Visit: Payer: Self-pay | Admitting: Family Medicine

## 2024-04-26 DIAGNOSIS — I129 Hypertensive chronic kidney disease with stage 1 through stage 4 chronic kidney disease, or unspecified chronic kidney disease: Secondary | ICD-10-CM | POA: Diagnosis not present

## 2024-04-26 DIAGNOSIS — N2581 Secondary hyperparathyroidism of renal origin: Secondary | ICD-10-CM | POA: Diagnosis not present

## 2024-04-26 DIAGNOSIS — D631 Anemia in chronic kidney disease: Secondary | ICD-10-CM | POA: Diagnosis not present

## 2024-04-26 DIAGNOSIS — N1831 Chronic kidney disease, stage 3a: Secondary | ICD-10-CM | POA: Diagnosis not present

## 2024-05-04 ENCOUNTER — Ambulatory Visit (INDEPENDENT_AMBULATORY_CARE_PROVIDER_SITE_OTHER)

## 2024-05-04 ENCOUNTER — Ambulatory Visit: Payer: Medicare Other | Admitting: Podiatry

## 2024-05-04 ENCOUNTER — Encounter: Payer: Self-pay | Admitting: Podiatry

## 2024-05-04 DIAGNOSIS — M79674 Pain in right toe(s): Secondary | ICD-10-CM | POA: Diagnosis not present

## 2024-05-04 DIAGNOSIS — E1142 Type 2 diabetes mellitus with diabetic polyneuropathy: Secondary | ICD-10-CM | POA: Diagnosis not present

## 2024-05-04 DIAGNOSIS — M79675 Pain in left toe(s): Secondary | ICD-10-CM | POA: Diagnosis not present

## 2024-05-04 DIAGNOSIS — B351 Tinea unguium: Secondary | ICD-10-CM | POA: Diagnosis not present

## 2024-05-04 DIAGNOSIS — M2142 Flat foot [pes planus] (acquired), left foot: Secondary | ICD-10-CM | POA: Diagnosis not present

## 2024-05-04 DIAGNOSIS — M2141 Flat foot [pes planus] (acquired), right foot: Secondary | ICD-10-CM | POA: Diagnosis not present

## 2024-05-04 NOTE — Progress Notes (Addendum)
  Subjective:  Patient ID: Adrian Matthews, male    DOB: 28-Sep-1946,  MRN: [id number]  Chief Complaint  Patient presents with   Diabetes    DFC IDDM A1C 7.2. OV with PCP 03/2024.     78 y.o. male presents with at risk foot care with history of diabetic neuropathy and painful thick toenails that are difficult to trim. Pain interferes with ambulation. Aggravating factors include wearing enclosed shoe gear. Pain is relieved with periodic professional debridement..   Chief Complaint  Patient presents with   Diabetes    DFC IDDM A1C 7.2. OV with PCP 03/2024.   New problem(s): None    PCP: Sherlynn Madden, MD.  Review of Systems: Negative except as noted in the HPI.   No Known Allergies  Objective:  There were no vitals filed for this visit. Constitutional Patient is a pleasant 78 y.o. male morbidly obese in NAD. AAO x 3.  Vascular Capillary fill time to digits immediate b/l.  DP/PT pulse(s) are palpable b/l lower extremities. Pedal hair sparse. Lower extremity skin temperature gradient within normal limits. No pain with calf compression b/l. No edema noted b/l lower extremities. No cyanosis or clubbing noted. Trace edema noted BLE.  Neurologic Protective sensation intact 5/5 intact bilaterally with 10g monofilament b/l. Vibratory sensation intact b/l. No clonus b/l. Pt has subjective symptoms of neuropathy.  Dermatologic Pedal skin is warm and supple b/l.  No open wounds b/l lower extremities. No interdigital macerations b/l lower extremities. Toenails 1-5 b/l elongated, discolored, dystrophic, thickened, crumbly with subungual debris and tenderness to dorsal palpation. No hyperkeratotic nor porokeratotic lesions present on today's visit.  Orthopedic: Normal muscle strength 5/5 to all lower extremity muscle groups bilaterally. Patient ambulates independent of any assistive aids. Pes planus deformity noted bilateral LE.      Latest Ref Rng & Units 11/17/2023    9:05 AM  Hemoglobin A1C   Hemoglobin-A1c <5.7 % of total Hgb 7.2    Radiographs:  None  Assessment:   1. Pain due to onychomycosis of toenails of both feet   2. Diabetic peripheral neuropathy associated with type 2 diabetes mellitus (HCC)    Plan:  Consent given for treatment. Patient examined. All patient's and/or POA's questions/concerns addressed on today's visit. Toenails 1-5 debrided in length and girth without incident. Continue foot and shoe inspections daily. Treatment was provided by assistant Andrez Manchester under my supervision. Monitor blood glucose per PCP/Endocrinologist's recommendations. Continue soft, supportive shoe gear daily. Report any pedal injuries to medical professional. Call office if there are any questions/concerns.  Return in about 3 months (around 08/04/2024).  Delon LITTIE Merlin, DPM      [place] LOCATION: [address], [place] [address]                   Office [phone]   [facility] LOCATION: [address] [place], [place] [address] Office [phone]

## 2024-05-04 NOTE — Progress Notes (Signed)
 Patient presents today to pick up diabetic shoes and insoles.  Patient was dispensed 1 pair of diabetic shoes and 3 pairs of total contact diabetic insoles. Fit was satisfactory. Instructions for break-in and wear was reviewed and a copy was given to the patient.   Re-appointment for regularly scheduled diabetic foot care visits or if they should experience any trouble with the shoes or insoles.    Britton Cane Cped, CFo, CFm

## 2024-05-08 ENCOUNTER — Encounter: Payer: Self-pay | Admitting: Podiatry

## 2024-06-21 DIAGNOSIS — D352 Benign neoplasm of pituitary gland: Secondary | ICD-10-CM | POA: Diagnosis not present

## 2024-06-21 DIAGNOSIS — E1129 Type 2 diabetes mellitus with other diabetic kidney complication: Secondary | ICD-10-CM | POA: Diagnosis not present

## 2024-06-21 DIAGNOSIS — Z794 Long term (current) use of insulin: Secondary | ICD-10-CM | POA: Diagnosis not present

## 2024-06-21 DIAGNOSIS — E221 Hyperprolactinemia: Secondary | ICD-10-CM | POA: Diagnosis not present

## 2024-06-21 DIAGNOSIS — M1A30X0 Chronic gout due to renal impairment, unspecified site, without tophus (tophi): Secondary | ICD-10-CM | POA: Diagnosis not present

## 2024-06-21 DIAGNOSIS — E785 Hyperlipidemia, unspecified: Secondary | ICD-10-CM | POA: Diagnosis not present

## 2024-06-21 DIAGNOSIS — I10 Essential (primary) hypertension: Secondary | ICD-10-CM | POA: Diagnosis not present

## 2024-06-21 DIAGNOSIS — N2581 Secondary hyperparathyroidism of renal origin: Secondary | ICD-10-CM | POA: Diagnosis not present

## 2024-06-21 DIAGNOSIS — E291 Testicular hypofunction: Secondary | ICD-10-CM | POA: Diagnosis not present

## 2024-06-21 DIAGNOSIS — Z23 Encounter for immunization: Secondary | ICD-10-CM | POA: Diagnosis not present

## 2024-06-21 DIAGNOSIS — N1831 Chronic kidney disease, stage 3a: Secondary | ICD-10-CM | POA: Diagnosis not present

## 2024-06-21 LAB — HEMOGLOBIN A1C: Hemoglobin A1C: 6.2

## 2024-07-12 ENCOUNTER — Telehealth: Payer: Self-pay

## 2024-07-12 MED ORDER — ALLOPURINOL 100 MG PO TABS: 100.0000 mg | ORAL_TABLET | Freq: Two times a day (BID) | ORAL | 0 refills | Status: DC

## 2024-07-12 NOTE — Telephone Encounter (Signed)
 Copied from CRM [id number]. Topic: Clinical - Prescription Issue >> Jul 12, 2024  1:12 PM Fredrica W wrote: Reason for CRM: Patient called. Spoke with [facility].allopurinol  (ZYLOPRIM ) 100 MG tablet is back ordered - unable to fill. Has permission to send to  [id number] - [place], [place] - [address] Thank You

## 2024-07-14 ENCOUNTER — Other Ambulatory Visit: Payer: Self-pay

## 2024-07-14 MED ORDER — ALLOPURINOL 100 MG PO TABS: 100.0000 mg | ORAL_TABLET | Freq: Two times a day (BID) | ORAL | 1 refills | Status: AC

## 2024-08-02 ENCOUNTER — Ambulatory Visit (INDEPENDENT_AMBULATORY_CARE_PROVIDER_SITE_OTHER): Admitting: Sports Medicine

## 2024-08-02 ENCOUNTER — Encounter: Payer: Self-pay | Admitting: Sports Medicine

## 2024-08-02 VITALS — BP 118/82 | HR 86 | Temp 97.4°F | Ht 71.0 in | Wt 394.6 lb

## 2024-08-02 DIAGNOSIS — Z794 Long term (current) use of insulin: Secondary | ICD-10-CM | POA: Diagnosis not present

## 2024-08-02 DIAGNOSIS — I10 Essential (primary) hypertension: Secondary | ICD-10-CM | POA: Diagnosis not present

## 2024-08-02 DIAGNOSIS — E785 Hyperlipidemia, unspecified: Secondary | ICD-10-CM | POA: Diagnosis not present

## 2024-08-02 DIAGNOSIS — E1122 Type 2 diabetes mellitus with diabetic chronic kidney disease: Secondary | ICD-10-CM | POA: Diagnosis not present

## 2024-08-02 DIAGNOSIS — E1169 Type 2 diabetes mellitus with other specified complication: Secondary | ICD-10-CM

## 2024-08-02 DIAGNOSIS — K219 Gastro-esophageal reflux disease without esophagitis: Secondary | ICD-10-CM

## 2024-08-02 DIAGNOSIS — N1832 Chronic kidney disease, stage 3b: Secondary | ICD-10-CM | POA: Diagnosis not present

## 2024-08-02 DIAGNOSIS — M1A30X0 Chronic gout due to renal impairment, unspecified site, without tophus (tophi): Secondary | ICD-10-CM | POA: Insufficient documentation

## 2024-08-02 NOTE — Progress Notes (Signed)
 Careteam: Patient Care Team: Sherlynn Madden, MD as PCP - General (Internal Medicine) Tobie Gordy POUR, MD as Consulting Physician (Nephrology) Robinson Idol, MD as Consulting Physician (Ophthalmology) Donnald Charleston, MD as Consulting Physician (Gastroenterology)  PLACE OF SERVICE:  [facility]  Advanced Directive information    No Known Allergies  No chief complaint on file.    Discussed the use of AI scribe software for clinical note transcription with the patient, who gave verbal consent to proceed.  History of Present Illness    Adrian Matthews is a 78 year old male who presents for routine follow-up.  He has diabetes, with an A1c of 6.2 as of September. He checks his blood sugar four times a day, with morning readings around 123 mg/dL. Occasionally, his blood sugar drops below 70 mg/dL, particularly if he skips meals or consumes fewer carbohydrates. He is on Lantus  66 units, Ozempic , and uses a sliding scale for Novolog , taking 22 units with breakfast and lunch, and 26 units before dinner. He sometimes omits Novolog  if his blood sugar is low.  He has hypertension, with a recent blood pressure reading of 180/82 mmHg. His current medications include  Irbesartan , chlorthalidone , and amlodipine . He experiences occasional dizziness and lightheadedness, and drinks plenty of water daily.  He takes atorvastatin  40 mg every night for cholesterol management.    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 He maintains an active lifestyle, swimming at the [facility] for 45 minutes to an hour on Mondays, Wednesdays, and Fridays. He reports no joint pain, chest pain, or difficulty breathing during exercise.  No symptoms of depression or anxiety, stating 'I have no reason to be depressed.'    Review of Systems:  Review of Systems  Constitutional:  Negative for chills and fever.  HENT:  Negative for congestion and sore throat.   Respiratory:   Negative for cough, sputum production and shortness of breath.   Cardiovascular:  Negative for chest pain and palpitations.  Gastrointestinal:  Negative for abdominal pain, heartburn and nausea.  Genitourinary:  Negative for dysuria, frequency and hematuria.  Musculoskeletal:  Positive for joint pain. Negative for falls and myalgias.  Neurological:  Negative for dizziness, sensory change and focal weakness.   Negative unless indicated in HPI.   Past Medical History:  Diagnosis Date   Asymptomatic varicose veins    Chronic kidney disease    Diaphragmatic hernia without mention of obstruction or gangrene    DM (diabetes mellitus) (HCC)    GERD (gastroesophageal reflux disease)    Gout, unspecified    Hypercalcemia    Hyperlipidemia    Hyperosmolality and/or hypernatremia    Hypertension    Monoclonal paraproteinemia    New daily persistent headache    Osteoarthrosis, unspecified whether generalized or localized, lower leg    Other malaise and fatigue    Primary localized osteoarthrosis, unspecified site    Unspecified disorder of kidney and ureter    Unspecified hereditary and idiopathic peripheral neuropathy    Past Surgical History:  Procedure Laterality Date   BIOPSY  10/19/2018   Procedure: BIOPSY;  Surgeon: Donnald Charleston, MD;  Location: [facility] END[facility]COPY;  Service: Endoscopy;;   CARPAL TUNNEL RELEASE  2007   COLONOSCOPY N/A 06/06/2013   Procedure: COLONOSCOPY;  Surgeon: Charleston LULLA Donnald, MD;  Location: [facility] END[facility]COPY;  Service: Endoscopy;  Laterality: N/A;   COLONOSCOPY WITH PROPOFOL  N/A 10/19/2018   Procedure: COLONOSCOPY WITH PROPOFOL ;  Surgeon: Donnald Charleston, MD;  Location: [facility];  Service: Endoscopy;  Laterality: N/A;   POLYPECTOMY  10/19/2018   Procedure: POLYPECTOMY;  Surgeon: Donnald Charleston, MD;  Location: [facility] ENDOS[facility];  Service: Endoscopy;;   TUMOR REMOVAL  2017   Pituitary adenoma   Social History:   reports that he has never smoked. He has never used smokeless  tobacco. He reports that he does not drink alcohol  and does not use drugs.  Family History  Problem Relation Age of Onset   Hypertension Sister    Hypertension Brother    Diabetes Brother    Hypertension Brother    Hypertension Sister    Migraines Neg Hx     Medications: Patient's Medications  New Prescriptions   No medications on file  Previous Medications   ALLOPURINOL  (ZYLOPRIM ) 100 MG TABLET    Take 1 tablet (100 mg total) by mouth 2 (two) times daily.   AMLODIPINE  (NORVASC ) 10 MG TABLET    TAKE 1 TABLET DAILY FOR BLOOD PRESSURE   ASPIRIN  EC 81 MG TABLET    Take 1 tablet (81 mg total) by mouth daily.   ATORVASTATIN  (LIPITOR) 40 MG TABLET    TAKE 1 TABLET DAILY   BLOOD GLUCOSE MONITORING SUPPL (ACCU-CHEK AVIVA PLUS) W/DEVICE KIT    1 Device by Does not apply route in the morning and at bedtime.   CHLORTHALIDONE  (HYGROTON ) 25 MG TABLET    TAKE 1 TABLET DAILY FOR BLOOD PRESSURE   CHOLECALCIFEROL (VITAMIN D3) 2000 UNITS TABS    Take 2,000 Units by mouth daily.    GABAPENTIN  (NEURONTIN ) 100 MG CAPSULE    Take 2 capsules (200 mg total) by mouth 2 (two) times daily.   GLUCOSE BLOOD (FREESTYLE LITE) TEST STRIP    CHECK BLOOD SUGAR THREE TIMES A DAY   INSULIN  GLARGINE (LANTUS  SOLOSTAR) 100 UNIT/ML SOLOSTAR PEN    Inject 66 units under the skin daily.   INSULIN  LISPRO (HUMALOG ) 100 UNIT/ML KWIKPEN    INJECT 22 UNITS BEFORE BREAKFAST, 22UNITS BEFORE LUNCH AND 26 UNITS BEFORE SUPPER PLUS SLIDING SCALE   INSULIN  PEN NEEDLE (SURE COMFORT PEN NEEDLES) 32G X 4 MM MISC    Use 4 times daily before meals DX: E11.65   IRBESARTAN  (AVAPRO ) 300 MG TABLET    TAKE 1 TABLET DAILY   LANCETS MISC. (ACCU-CHEK FASTCLIX LANCET) KIT    1 Device by Does not apply route in the morning and at bedtime.   OMEGA-3 FATTY ACIDS (FISH OIL) 1200 MG CAPS    Take 1,200 mg by mouth daily.    OZEMPIC , 0.25 OR 0.5 MG/DOSE, 2 MG/3ML SOPN    INJECT 0.25 MG UNDER THE SKIN ONCE A WEEK   PANTOPRAZOLE  (PROTONIX ) 40 MG TABLET     TAKE 1 TABLET DAILY   POTASSIUM CHLORIDE  SA (KLOR-CON  M) 20 MEQ TABLET    TAKE 1 TABLET TWICE A DAY   TRAMADOL  (ULTRAM ) 50 MG TABLET    Take 50 mg by mouth as needed for moderate pain (pain score 4-6).  Modified Medications   No medications on file  Discontinued Medications   No medications on file    Physical Exam: Vitals:   08/02/24 0820  BP: 118/82  Pulse: 86  Temp: (!) 97.4 F (36.3 C)  TempSrc: Temporal  SpO2: 98%  Weight: (!) 394 lb 9.6 oz (179 kg)  Height: 5' 11 (1.803 m)   Body mass index is 55.04 kg/m. BP Readings from Last 3 Encounters:  08/02/24 118/82  03/29/24 124/78  11/17/23 (!) 142/68   [employer]  Readings from Last 3 Encounters:  08/02/24 (!) 394 lb 9.6 oz (179 kg)  03/29/24 (!) 405 lb 12.8 oz (184.1 kg)  11/17/23 (!) 415 lb (188.2 kg)    Physical Exam Constitutional:      Appearance: Normal appearance.  HENT:     Head: Normocephalic and atraumatic.  Cardiovascular:     Rate and Rhythm: Normal rate and regular rhythm.     Pulses: Normal pulses.     Heart sounds: Normal heart sounds.  Pulmonary:     Effort: No respiratory distress.     Breath sounds: No stridor. No wheezing or rales.  Abdominal:     General: Bowel sounds are normal. There is no distension.     Palpations: Abdomen is soft.     Tenderness: There is no abdominal tenderness. There is no guarding.  Neurological:     Mental Status: He is alert. Mental status is at baseline.     Sensory: No sensory deficit.     Motor: No weakness.     Labs reviewed: Basic Metabolic Panel: Recent Labs    11/17/23 0905 03/29/24 0904  NA 138 139  K 3.2* 4.1  CL 96* 99  CO2 29 31  GLUCOSE 126* 71  BUN 18 22  CREATININE 1.47* 1.55*  CALCIUM  9.8 9.9   Liver Function Tests: No results for input(s): AST, ALT, ALKPHOS, BILITOT, PROT, ALBUMIN in the last 8760 hours. No results for input(s): LIPASE, AMYLASE in the last 8760 hours. No results for input(s): AMMONIA in the last 8760  hours. CBC: No results for input(s): WBC, NEUTROABS, HGB, HCT, MCV, PLT in the last 8760 hours. Lipid Panel: Recent Labs    11/17/23 0905  CHOL 170  HDL 54  LDLCALC 97  TRIG 95  CHOLHDL 3.1   TSH: No results for input(s): TSH in the last 8760 hours. A1C: Lab Results  Component Value Date   HGBA1C 7.2 (H) 11/17/2023    Assessment and Plan Assessment & Plan  1. Type 2 diabetes mellitus with stage 3b chronic kidney disease, with long-term current use of insulin  (HCC) (Primary) A1c 6.2 Cont with lantus , novolog  Monitor BG   2. Essential hypertension At goal  Cont with the same   3. Hyperlipidemia associated with type 2 diabetes mellitus (HCC) Cont with lipitor   4. Gastroesophageal reflux disease, unspecified whether esophagitis present Denies acid reflux  Cont with pantoprazole   Other orders - traMADol  (ULTRAM ) 50 MG tablet; Take 50 mg by mouth as needed for moderate pain (pain score 4-6).

## 2024-08-04 DIAGNOSIS — H52201 Unspecified astigmatism, right eye: Secondary | ICD-10-CM | POA: Diagnosis not present

## 2024-08-04 DIAGNOSIS — E119 Type 2 diabetes mellitus without complications: Secondary | ICD-10-CM | POA: Diagnosis not present

## 2024-08-04 DIAGNOSIS — H25813 Combined forms of age-related cataract, bilateral: Secondary | ICD-10-CM | POA: Diagnosis not present

## 2024-08-04 LAB — OPHTHALMOLOGY REPORT-SCANNED

## 2024-08-09 ENCOUNTER — Ambulatory Visit (INDEPENDENT_AMBULATORY_CARE_PROVIDER_SITE_OTHER): Admitting: Podiatry

## 2024-08-09 ENCOUNTER — Encounter: Payer: Self-pay | Admitting: Podiatry

## 2024-08-09 DIAGNOSIS — M79675 Pain in left toe(s): Secondary | ICD-10-CM | POA: Diagnosis not present

## 2024-08-09 DIAGNOSIS — M79674 Pain in right toe(s): Secondary | ICD-10-CM

## 2024-08-09 DIAGNOSIS — E1142 Type 2 diabetes mellitus with diabetic polyneuropathy: Secondary | ICD-10-CM

## 2024-08-09 DIAGNOSIS — B351 Tinea unguium: Secondary | ICD-10-CM

## 2024-08-09 NOTE — Progress Notes (Signed)
  Subjective:  Patient ID: Adrian Matthews, male    DOB: June 23, 1946,  MRN: [id number]  Adrian Matthews presents to clinic today for at risk foot care with history of diabetic neuropathy and painful mycotic toenails x 10 which interfere with daily activities. Pain is relieved with periodic professional debridement. Patient states he purchased compression socks as we discussed on last visit. See, I listen to you. Chief Complaint  Patient presents with   Diabetes    DFC IDDM A1C 7.2 Toenail trim . LOV with PCP 08/02/24.   New problem(s): None.   PCP is Sherlynn Madden, MD.  No Known Allergies  Review of Systems: Negative except as noted in the HPI.  Objective: No changes noted in today's physical examination. There were no vitals filed for this visit. Adrian Matthews is a pleasant 78 y.o. male morbidly obese in NAD. AAO x 3.  Vascular Examination: Capillary refill time immediate b/l. Vascular status intact b/l with palpable pedal pulses. Pedal hair sparse b/l. No pain with calf compression b/l. Skin temperature gradient WNL b/l. No cyanosis or clubbing b/l. No ischemia or gangrene noted b/l. Patient wearing compression hose on today's visit. Nonpitting edema noted BLE.  Neurological Examination: Sensation grossly intact b/l with 10 gram monofilament. Vibratory sensation intact b/l. Pt has subjective symptoms of neuropathy.  Dermatological Examination: Pedal skin with normal turgor, texture and tone b/l.  No open wounds. No interdigital macerations.   Toenails 1-5 b/l thick, discolored, elongated with subungual debris and pain on dorsal palpation.   No corns, calluses, nor porokeratotic lesions.  Musculoskeletal Examination: Normal muscle strength 5/5 to all lower extremity muscle groups bilaterally. Pes planus deformity noted bilateral LE.SABRA No pain, crepitus or joint limitation noted with ROM b/l LE.  Patient ambulates independently without assistive aids.  Radiographs:  None  Last A1c:      Latest Ref Rng & Units 11/17/2023    9:05 AM  Hemoglobin A1C  Hemoglobin-A1c <5.7 % of total Hgb 7.2    Assessment/Plan: 1. Pain due to onychomycosis of toenails of both feet   2. Diabetic peripheral neuropathy associated with type 2 diabetes mellitus (HCC)    Patient was evaluated and treated. All patient's and/or POA's questions/concerns addressed on today's visit. Mycotic toenails 1-5 b/l debrided in length and girth without incident. Treatment was provided by assistant Andrez Manchester under my supervision. Continue daily foot inspections and monitor blood glucose per PCP/Endocrinologist's recommendations.Continue soft, supportive shoe gear daily. Report any pedal injuries to medical professional. Call office if there are any quesitons/concerns. -Patient/POA to call should there be question/concern in the interim.   Return in about 3 months (around 11/09/2024).  Delon LITTIE Merlin, DPM      [place] LOCATION: [address], [place] [address]                   Office [phone]   [facility] LOCATION: [address] [place], [place] [address] Office [phone]

## 2024-08-12 ENCOUNTER — Other Ambulatory Visit: Payer: Self-pay | Admitting: Sports Medicine

## 2024-08-19 ENCOUNTER — Other Ambulatory Visit: Payer: Self-pay | Admitting: Sports Medicine

## 2024-08-19 DIAGNOSIS — K219 Gastro-esophageal reflux disease without esophagitis: Secondary | ICD-10-CM

## 2024-08-29 ENCOUNTER — Other Ambulatory Visit: Payer: Self-pay | Admitting: Sports Medicine

## 2024-08-30 ENCOUNTER — Other Ambulatory Visit: Payer: Self-pay | Admitting: *Deleted

## 2024-08-30 MED ORDER — CHLORTHALIDONE 25 MG PO TABS: 25.0000 mg | ORAL_TABLET | Freq: Every day | ORAL | 1 refills | Status: DC

## 2024-08-30 NOTE — Telephone Encounter (Signed)
[facility] requested refill.  

## 2024-08-31 ENCOUNTER — Other Ambulatory Visit: Payer: Self-pay

## 2024-08-31 MED ORDER — CHLORTHALIDONE 25 MG PO TABS: 25.0000 mg | ORAL_TABLET | Freq: Every day | ORAL | 1 refills | Status: AC

## 2024-08-31 NOTE — Telephone Encounter (Signed)
 Rx refill received through our [facility] that sent to pharmacy for refill

## 2024-09-09 ENCOUNTER — Other Ambulatory Visit: Payer: Self-pay | Admitting: Sports Medicine

## 2024-09-09 DIAGNOSIS — I10 Essential (primary) hypertension: Secondary | ICD-10-CM

## 2024-09-26 ENCOUNTER — Other Ambulatory Visit: Payer: Self-pay | Admitting: Sports Medicine

## 2024-09-29 DIAGNOSIS — Z794 Long term (current) use of insulin: Secondary | ICD-10-CM

## 2024-09-29 DIAGNOSIS — M79674 Pain in right toe(s): Secondary | ICD-10-CM | POA: Diagnosis not present

## 2024-09-29 DIAGNOSIS — B351 Tinea unguium: Secondary | ICD-10-CM | POA: Diagnosis not present

## 2024-09-29 DIAGNOSIS — N1832 Chronic kidney disease, stage 3b: Secondary | ICD-10-CM

## 2024-09-29 DIAGNOSIS — M79675 Pain in left toe(s): Secondary | ICD-10-CM | POA: Diagnosis not present

## 2024-09-29 DIAGNOSIS — E1122 Type 2 diabetes mellitus with diabetic chronic kidney disease: Secondary | ICD-10-CM

## 2024-11-08 ENCOUNTER — Ambulatory Visit: Admitting: Podiatry

## 2025-02-07 ENCOUNTER — Ambulatory Visit: Admitting: Podiatry

## 2025-02-28 ENCOUNTER — Ambulatory Visit: Payer: Self-pay | Admitting: Family
